# Patient Record
Sex: Female | Born: 1945 | Race: White | Hispanic: No | Smoking: Former smoker
Health system: Southern US, Community
[De-identification: ages and names within clinical notes are randomized; demographics above are authoritative.]

## PROBLEM LIST (undated history)

## (undated) DIAGNOSIS — N200 Calculus of kidney: Secondary | ICD-10-CM

## (undated) DIAGNOSIS — T8859XA Other complications of anesthesia, initial encounter: Secondary | ICD-10-CM

## (undated) DIAGNOSIS — K8020 Calculus of gallbladder without cholecystitis without obstruction: Secondary | ICD-10-CM

## (undated) DIAGNOSIS — T4145XA Adverse effect of unspecified anesthetic, initial encounter: Secondary | ICD-10-CM

## (undated) DIAGNOSIS — K5792 Diverticulitis of intestine, part unspecified, without perforation or abscess without bleeding: Secondary | ICD-10-CM

## (undated) DIAGNOSIS — L719 Rosacea, unspecified: Secondary | ICD-10-CM

## (undated) HISTORY — PX: PARTIAL HYSTERECTOMY: SHX80

## (undated) HISTORY — PX: COLON SURGERY: SHX602

## (undated) HISTORY — PX: KNEE SURGERY: SHX244

## (undated) HISTORY — DX: Rosacea, unspecified: L71.9

## (undated) HISTORY — PX: ABDOMINAL HYSTERECTOMY: SHX81

## (undated) HISTORY — DX: Diverticulitis of intestine, part unspecified, without perforation or abscess without bleeding: K57.92

## (undated) HISTORY — PX: HEMORRHOID SURGERY: SHX153

---

## 2001-02-28 ENCOUNTER — Emergency Department (HOSPITAL_COMMUNITY): Admission: EM | Admit: 2001-02-28 | Discharge: 2001-02-28 | Payer: Self-pay | Admitting: Emergency Medicine

## 2001-11-22 ENCOUNTER — Other Ambulatory Visit: Admission: RE | Admit: 2001-11-22 | Discharge: 2001-11-22 | Payer: Self-pay | Admitting: Obstetrics & Gynecology

## 2002-03-09 ENCOUNTER — Emergency Department (HOSPITAL_COMMUNITY): Admission: EM | Admit: 2002-03-09 | Discharge: 2002-03-09 | Payer: Self-pay

## 2002-03-09 ENCOUNTER — Encounter: Payer: Self-pay | Admitting: Emergency Medicine

## 2002-11-23 ENCOUNTER — Other Ambulatory Visit: Admission: RE | Admit: 2002-11-23 | Discharge: 2002-11-23 | Payer: Self-pay | Admitting: Obstetrics & Gynecology

## 2003-11-29 ENCOUNTER — Other Ambulatory Visit: Admission: RE | Admit: 2003-11-29 | Discharge: 2003-11-29 | Payer: Self-pay | Admitting: Obstetrics & Gynecology

## 2004-12-23 ENCOUNTER — Other Ambulatory Visit: Admission: RE | Admit: 2004-12-23 | Discharge: 2004-12-23 | Payer: Self-pay | Admitting: Obstetrics and Gynecology

## 2006-03-03 ENCOUNTER — Other Ambulatory Visit: Admission: RE | Admit: 2006-03-03 | Discharge: 2006-03-03 | Payer: Self-pay | Admitting: Obstetrics and Gynecology

## 2007-02-21 ENCOUNTER — Emergency Department (HOSPITAL_COMMUNITY): Admission: EM | Admit: 2007-02-21 | Discharge: 2007-02-21 | Payer: Self-pay | Admitting: Family Medicine

## 2010-01-20 ENCOUNTER — Emergency Department (HOSPITAL_COMMUNITY): Admission: EM | Admit: 2010-01-20 | Discharge: 2010-01-20 | Payer: Self-pay | Admitting: Family Medicine

## 2010-01-24 ENCOUNTER — Emergency Department (HOSPITAL_COMMUNITY): Admission: EM | Admit: 2010-01-24 | Discharge: 2010-01-24 | Payer: Self-pay | Admitting: Family Medicine

## 2010-01-24 ENCOUNTER — Emergency Department (HOSPITAL_COMMUNITY): Admission: EM | Admit: 2010-01-24 | Discharge: 2010-01-24 | Payer: Self-pay | Admitting: Emergency Medicine

## 2010-07-13 ENCOUNTER — Inpatient Hospital Stay (HOSPITAL_COMMUNITY)
Admission: EM | Admit: 2010-07-13 | Discharge: 2010-07-16 | Payer: Self-pay | Source: Home / Self Care | Admitting: Emergency Medicine

## 2010-07-13 HISTORY — PX: TOTAL SHOULDER REPLACEMENT: SUR1217

## 2011-01-23 LAB — CBC
HCT: 34.6 % — ABNORMAL LOW (ref 36.0–46.0)
HCT: 35.5 % — ABNORMAL LOW (ref 36.0–46.0)
HCT: 47.8 % — ABNORMAL HIGH (ref 36.0–46.0)
Hemoglobin: 11.5 g/dL — ABNORMAL LOW (ref 12.0–15.0)
Hemoglobin: 11.8 g/dL — ABNORMAL LOW (ref 12.0–15.0)
Hemoglobin: 16.8 g/dL — ABNORMAL HIGH (ref 12.0–15.0)
MCH: 30.6 pg (ref 26.0–34.0)
MCH: 31.7 pg (ref 26.0–34.0)
MCH: 32.4 pg (ref 26.0–34.0)
MCHC: 33.2 g/dL (ref 30.0–36.0)
MCHC: 33.2 g/dL (ref 30.0–36.0)
MCHC: 35.1 g/dL (ref 30.0–36.0)
MCV: 92 fL (ref 78.0–100.0)
MCV: 92.3 fL (ref 78.0–100.0)
MCV: 95.3 fL (ref 78.0–100.0)
Platelets: 144 10*3/uL — ABNORMAL LOW (ref 150–400)
Platelets: 178 10*3/uL (ref 150–400)
Platelets: 197 10*3/uL (ref 150–400)
RBC: 3.63 MIL/uL — ABNORMAL LOW (ref 3.87–5.11)
RBC: 3.86 MIL/uL — ABNORMAL LOW (ref 3.87–5.11)
RBC: 5.18 MIL/uL — ABNORMAL HIGH (ref 3.87–5.11)
RDW: 13.2 % (ref 11.5–15.5)
RDW: 13.3 % (ref 11.5–15.5)
RDW: 13.9 % (ref 11.5–15.5)
WBC: 7.6 10*3/uL (ref 4.0–10.5)
WBC: 8.4 10*3/uL (ref 4.0–10.5)
WBC: 8.6 10*3/uL (ref 4.0–10.5)

## 2011-01-23 LAB — DIFFERENTIAL
Basophils Absolute: 0 10*3/uL (ref 0.0–0.1)
Basophils Relative: 0 % (ref 0–1)
Eosinophils Absolute: 0.1 10*3/uL (ref 0.0–0.7)
Eosinophils Relative: 1 % (ref 0–5)
Lymphocytes Relative: 22 % (ref 12–46)
Lymphs Abs: 1.8 10*3/uL (ref 0.7–4.0)
Monocytes Absolute: 0.6 10*3/uL (ref 0.1–1.0)
Monocytes Relative: 7 % (ref 3–12)
Neutro Abs: 5.9 10*3/uL (ref 1.7–7.7)
Neutrophils Relative %: 71 % (ref 43–77)

## 2011-01-23 LAB — BASIC METABOLIC PANEL
BUN: 5 mg/dL — ABNORMAL LOW (ref 6–23)
BUN: 7 mg/dL (ref 6–23)
BUN: 9 mg/dL (ref 6–23)
CO2: 23 mEq/L (ref 19–32)
CO2: 24 mEq/L (ref 19–32)
CO2: 28 mEq/L (ref 19–32)
Calcium: 10.1 mg/dL (ref 8.4–10.5)
Calcium: 8.5 mg/dL (ref 8.4–10.5)
Calcium: 8.8 mg/dL (ref 8.4–10.5)
Chloride: 110 mEq/L (ref 96–112)
Chloride: 110 mEq/L (ref 96–112)
Chloride: 110 mEq/L (ref 96–112)
Creatinine, Ser: 0.45 mg/dL (ref 0.4–1.2)
Creatinine, Ser: 0.54 mg/dL (ref 0.4–1.2)
Creatinine, Ser: 0.69 mg/dL (ref 0.4–1.2)
GFR calc Af Amer: >60 mL/min (ref >60)
GFR calc Af Amer: >60 mL/min (ref >60)
GFR calc Af Amer: >60 mL/min (ref >60)
GFR calc non Af Amer: >60 mL/min (ref >60)
GFR calc non Af Amer: >60 mL/min (ref >60)
GFR calc non Af Amer: >60 mL/min (ref >60)
Glucose, Bld: 100 mg/dL — ABNORMAL HIGH (ref 70–99)
Glucose, Bld: 120 mg/dL — ABNORMAL HIGH (ref 70–99)
Glucose, Bld: 96 mg/dL (ref 70–99)
Potassium: 3.7 mEq/L (ref 3.5–5.1)
Potassium: 3.9 mEq/L (ref 3.5–5.1)
Potassium: 4 mEq/L (ref 3.5–5.1)
Sodium: 140 mEq/L (ref 135–145)
Sodium: 141 mEq/L (ref 135–145)
Sodium: 141 mEq/L (ref 135–145)

## 2011-01-23 LAB — POCT I-STAT, CHEM 8
BUN: 9 mg/dL (ref 6–23)
Calcium, Ion: 1.17 mmol/L (ref 1.12–1.32)
Chloride: 109 mEq/L (ref 96–112)
Creatinine, Ser: 0.8 mg/dL (ref 0.4–1.2)
Glucose, Bld: 100 mg/dL — ABNORMAL HIGH (ref 70–99)
HCT: 48 % — ABNORMAL HIGH (ref 36.0–46.0)
Hemoglobin: 16.3 g/dL — ABNORMAL HIGH (ref 12.0–15.0)
Potassium: 3.7 mEq/L (ref 3.5–5.1)
Sodium: 141 mEq/L (ref 135–145)
TCO2: 22 mmol/L (ref 0–100)

## 2011-01-23 LAB — TYPE AND SCREEN
ABO/RH(D): O POS
Antibody Screen: NEGATIVE

## 2011-01-23 LAB — URINALYSIS, ROUTINE W REFLEX MICROSCOPIC
Bilirubin Urine: NEGATIVE
Glucose, UA: NEGATIVE mg/dL
Hgb urine dipstick: NEGATIVE
Ketones, ur: >80 mg/dL — AB
Nitrite: NEGATIVE
Protein, ur: NEGATIVE mg/dL
Specific Gravity, Urine: 1.018 (ref 1.005–1.030)
Urobilinogen, UA: 0.2 mg/dL (ref 0.0–1.0)
pH: 6 (ref 5.0–8.0)

## 2011-01-23 LAB — ABO/RH: ABO/RH(D): O POS

## 2011-01-23 LAB — PROTIME-INR
INR: 1.12 (ref 0.00–1.49)
Prothrombin Time: 14.6 seconds (ref 11.6–15.2)

## 2011-01-23 LAB — APTT: aPTT: 24 seconds (ref 24–37)

## 2011-01-31 LAB — URINALYSIS, ROUTINE W REFLEX MICROSCOPIC
Bilirubin Urine: NEGATIVE
Glucose, UA: NEGATIVE mg/dL
Hgb urine dipstick: NEGATIVE
Ketones, ur: NEGATIVE mg/dL
Nitrite: NEGATIVE
Protein, ur: NEGATIVE mg/dL
Specific Gravity, Urine: 1.01 (ref 1.005–1.030)
Urobilinogen, UA: 0.2 mg/dL (ref 0.0–1.0)
pH: 5.5 (ref 5.0–8.0)

## 2011-01-31 LAB — POCT URINALYSIS DIP (DEVICE)
Bilirubin Urine: NEGATIVE
Bilirubin Urine: NEGATIVE
Glucose, UA: NEGATIVE mg/dL
Glucose, UA: NEGATIVE mg/dL
Hgb urine dipstick: NEGATIVE
Hgb urine dipstick: NEGATIVE
Ketones, ur: NEGATIVE mg/dL
Nitrite: NEGATIVE
Nitrite: NEGATIVE
Protein, ur: NEGATIVE mg/dL
Protein, ur: NEGATIVE mg/dL
Specific Gravity, Urine: 1.02 (ref 1.005–1.030)
Specific Gravity, Urine: <=1.005 (ref 1.005–1.030)
Urobilinogen, UA: 0.2 mg/dL (ref 0.0–1.0)
Urobilinogen, UA: 0.2 mg/dL (ref 0.0–1.0)
pH: 6 (ref 5.0–8.0)
pH: 7.5 (ref 5.0–8.0)

## 2011-01-31 LAB — URINE MICROSCOPIC-ADD ON

## 2011-01-31 LAB — URINE CULTURE
Colony Count: NO GROWTH
Culture: NO GROWTH

## 2011-10-28 ENCOUNTER — Encounter: Payer: Self-pay | Admitting: Emergency Medicine

## 2011-10-28 ENCOUNTER — Emergency Department (HOSPITAL_COMMUNITY): Payer: BC Managed Care – PPO

## 2011-10-28 ENCOUNTER — Emergency Department (HOSPITAL_COMMUNITY)
Admission: EM | Admit: 2011-10-28 | Discharge: 2011-10-28 | Disposition: A | Discharge status: Home / Self Care | Payer: BC Managed Care – PPO | Attending: Emergency Medicine | Admitting: Emergency Medicine

## 2011-10-28 DIAGNOSIS — K5730 Diverticulosis of large intestine without perforation or abscess without bleeding: Secondary | ICD-10-CM | POA: Insufficient documentation

## 2011-10-28 DIAGNOSIS — R319 Hematuria, unspecified: Secondary | ICD-10-CM | POA: Insufficient documentation

## 2011-10-28 DIAGNOSIS — R10814 Left lower quadrant abdominal tenderness: Secondary | ICD-10-CM | POA: Insufficient documentation

## 2011-10-28 DIAGNOSIS — R109 Unspecified abdominal pain: Secondary | ICD-10-CM | POA: Insufficient documentation

## 2011-10-28 DIAGNOSIS — Z87442 Personal history of urinary calculi: Secondary | ICD-10-CM | POA: Insufficient documentation

## 2011-10-28 HISTORY — DX: Calculus of kidney: N20.0

## 2011-10-28 LAB — URINALYSIS, ROUTINE W REFLEX MICROSCOPIC
Bilirubin Urine: NEGATIVE
Glucose, UA: NEGATIVE mg/dL
Ketones, ur: NEGATIVE mg/dL
Leukocytes, UA: NEGATIVE
Nitrite: NEGATIVE
Protein, ur: NEGATIVE mg/dL
Specific Gravity, Urine: 1.006 (ref 1.005–1.030)
Urobilinogen, UA: 0.2 mg/dL (ref 0.0–1.0)
pH: 7 (ref 5.0–8.0)

## 2011-10-28 LAB — URINE MICROSCOPIC-ADD ON

## 2011-10-28 MED ORDER — ONDANSETRON HCL 4 MG/2ML IJ SOLN
4.0000 mg | Freq: Once | INTRAMUSCULAR | Status: AC
  Administered 2011-10-28: 4 mg via INTRAVENOUS
  Filled 2011-10-28: qty 2

## 2011-10-28 MED ORDER — SODIUM CHLORIDE 0.9 % IV SOLN
INTRAVENOUS | Status: DC
  Administered 2011-10-28: 05:00:00 via INTRAVENOUS

## 2011-10-28 MED ORDER — HYDROCODONE-ACETAMINOPHEN 5-325 MG PO TABS: 2.0000 | ORAL_TABLET | ORAL | Status: AC | PRN

## 2011-10-28 MED ORDER — MORPHINE SULFATE 2 MG/ML IJ SOLN
INTRAMUSCULAR | Status: AC
  Administered 2011-10-28: 4 mg via INTRAMUSCULAR
  Filled 2011-10-28: qty 2

## 2011-10-28 MED ORDER — MORPHINE SULFATE 4 MG/ML IJ SOLN: 4.0000 mg | Freq: Once | INTRAMUSCULAR | Status: DC

## 2011-10-28 NOTE — ED Provider Notes (Signed)
History     CSN: [id number] Arrival date & time: 10/28/2011  2:13 AM   First MD Initiated Contact with Patient 10/28/11 0503      Chief Complaint  Patient presents with  . Flank Pain    (Consider location/radiation/quality/duration/timing/severity/associated sxs/prior treatment) Patient is a 65 y.o. female presenting with flank pain. The history is provided by the patient.  Flank Pain This is a new problem. The current episode started 3 to 5 hours ago. The problem occurs constantly. The problem has not changed since onset.Pertinent negatives include no chest pain, no abdominal pain and no headaches. The symptoms are aggravated by nothing. The symptoms are relieved by nothing. She has tried nothing for the symptoms.   since avoiding in emergency department. Her pain has improved, but is still 6/10. She last passed a kidney stone on her own 6 months ago. She has not had repeated abdominal imaging for evaluation of kidney stones.  Past Medical History  Diagnosis Date  . Kidney stones     Past Surgical History  Procedure Date  . Total shoulder replacement     Family History  Problem Relation Age of Onset  . Nephrolithiasis Other     History  Substance Use Topics  . Smoking status: Never Smoker   . Smokeless tobacco: Not on file  . Alcohol Use: No    OB History    Grav Para Term Preterm Abortions TAB SAB Ect Mult Living                  Review of Systems  Cardiovascular: Negative for chest pain.  Gastrointestinal: Negative for abdominal pain.  Genitourinary: Positive for flank pain.  Neurological: Negative for headaches.  All other systems reviewed and are negative.    Allergies  Naproxen; Macrobid; and Penicillins  Home Medications   Current Outpatient Rx  Name Route Sig Dispense Refill  . HYDROCODONE-ACETAMINOPHEN 5-325 MG PO TABS Oral Take 2 tablets by mouth every 4 (four) hours as needed for pain. 20 tablet 0    BP 141/88  Pulse 81  Temp(Src) 98.1  F (36.7 C) (Oral)  Resp 20  SpO2 96%  Physical Exam  Constitutional: She is oriented to person, place, and time. She appears well-developed and well-nourished.  HENT:  Head: Normocephalic and atraumatic.  Eyes: Conjunctivae are normal. Pupils are equal, round, and reactive to light.  Neck: Neck supple.  Cardiovascular: Normal rate.   Pulmonary/Chest: Effort normal and breath sounds normal.  Abdominal: Soft. Bowel sounds are normal. She exhibits no distension. There is tenderness (Left lower quadrant, mild). There is no rebound.  Musculoskeletal: Normal range of motion.       No costovertebral angle tenderness  Neurological: She is alert and oriented to person, place, and time.  Skin: Skin is warm and dry.  Psychiatric: She has a normal mood and affect. Her behavior is normal. Judgment and thought content normal.    ED Course  Procedures (including critical care time) ED treatment, morphine and Zofran. At this point, the patient is comfortable. Labs Reviewed  URINALYSIS, ROUTINE W REFLEX MICROSCOPIC - Abnormal; Notable for the following:    Hgb urine dipstick LARGE (*)    All other components within normal limits  URINE MICROSCOPIC-ADD ON  URINE CULTURE   Ct Abdomen Pelvis Wo Contrast  10/28/2011  *RADIOLOGY REPORT*  Clinical Data: Left flank pain, hematuria  CT ABDOMEN AND PELVIS WITHOUT CONTRAST  Technique:  Multidetector CT imaging of the abdomen and pelvis was performed following  the standard protocol without intravenous contrast.  Comparison: None.  Findings: Peripheral right lower lobe scarring and/or atelectasis. Heart size within normal limits.  No pleural or pericardial effusion. Small hiatal hernia.  Intra-abdominal organ evaluation is limited without intravenous contrast.  Within this limitation, low attenuation of the liver is in keeping with fatty infiltration.  Unremarkable biliary system, spleen, pancreas, adrenal glands.  Nonobstructing bilateral upper pole renal  stones.  No hydronephrosis or hydroureter.  No left-sided ureteral calculi. There is a 4 mm calcific density adjacent to or within the right distal ureter.  Colonic diverticulosis without CT evidence for diverticulitis. Appendix is normal.  No free intraperitoneal air or fluid.  No lymphadenopathy.  There is scattered atherosclerotic calcification of the aorta and its branches. No aneurysmal dilatation.  Thin-walled bladder.  No acute osseous abnormality.  IMPRESSION: Bilateral nonobstructing renal stones.  No hydronephrosis or hydroureter.  No left-sided ureteral calculi.  There is a 4 mm calcification within or immediately adjacent to the decompressed right distal ureter (Therefore, may represent a ureteral stone without proximal dilatation).  Colonic diverticulosis without CT evidence of diverticulitis.  Original Report Authenticated By: Waneta Martins, M.D.     1. Abdominal pain   2. Hematuria       MDM  Patient had left lower abdominal pain that has improved in the emergency department. CT did not reveal a distal left ureter stone, nor left-sided hydronephrosis. Additionally, note other acute intra-abdominal problems were seen on the CAT scan. Urinalysis is remarkable only for elevated red blood cells. It is possible that she has passed. A kidney stone on the left. The CT finding of a possible distal right ureter stone is not consistent with her complaints of physical examination. Patient will be discharged with prescription for narcotics for symptomatic treatment and advised to followup with her PCP for the hematuria.        Flint Melter, MD 10/28/11 [phone]

## 2011-10-28 NOTE — ED Notes (Signed)
Pt presents with c/o left flank pain that started tonight about 0045  Pt states pain is sharp in nature and continuous  Pt states she has had nausea and vomiting tonight  Pt has hx of kidney stones

## 2011-10-29 LAB — URINE CULTURE
Colony Count: 70000
Culture  Setup Time: 201212180959

## 2011-12-01 ENCOUNTER — Emergency Department (INDEPENDENT_AMBULATORY_CARE_PROVIDER_SITE_OTHER)
Admission: EM | Admit: 2011-12-01 | Discharge: 2011-12-01 | Disposition: A | Discharge status: Home / Self Care | Payer: BC Managed Care – PPO | Source: Home / Self Care | Attending: Emergency Medicine | Admitting: Emergency Medicine

## 2011-12-01 ENCOUNTER — Encounter (HOSPITAL_COMMUNITY): Payer: Self-pay

## 2011-12-01 DIAGNOSIS — R300 Dysuria: Secondary | ICD-10-CM

## 2011-12-01 HISTORY — DX: Calculus of kidney: N20.0

## 2011-12-01 LAB — POCT URINALYSIS DIP (DEVICE)
Glucose, UA: NEGATIVE mg/dL
Ketones, ur: NEGATIVE mg/dL
Nitrite: NEGATIVE
Protein, ur: NEGATIVE mg/dL
Specific Gravity, Urine: >=1.03 (ref 1.005–1.030)
Urobilinogen, UA: 0.2 mg/dL (ref 0.0–1.0)
pH: 6 (ref 5.0–8.0)

## 2011-12-01 LAB — URINE CULTURE: Culture  Setup Time: 201301211440

## 2011-12-01 MED ORDER — CIPROFLOXACIN HCL 500 MG PO TABS: 500.0000 mg | ORAL_TABLET | Freq: Two times a day (BID) | ORAL | Status: AC

## 2011-12-01 NOTE — ED Notes (Signed)
Seen in ED and treated for kidney stones, instructed to follow-up after medication completed.  States took last pill this am.  Reports having passed one stone, still having lower abdominal discomfort, denies other sx.

## 2011-12-01 NOTE — ED Provider Notes (Signed)
History     CSN: [id number]  Arrival date & time 12/01/11  2130   First MD Initiated Contact with Patient 12/01/11 [phone]      Chief Complaint  Patient presents with  . Follow-up  . Nephrolithiasis    (Consider location/radiation/quality/duration/timing/severity/associated sxs/prior treatment) HPI Comments: Still with some pressure and discomfort, no burning but feels like i still have to urinate afer Im done, did passed a stone days earlier" I have had many stones in the past" No i did not follow-up with my doctor as it got better",  No fevers, No flank pain, No vomiting  The history is provided by the patient.    Past Medical History  Diagnosis Date  . Kidney stones   . Kidney stone     Past Surgical History  Procedure Date  . Total shoulder replacement   . Cesarean section   . Abdominal hysterectomy   . Knee surgery     Family History  Problem Relation Age of Onset  . Nephrolithiasis Other     History  Substance Use Topics  . Smoking status: Never Smoker   . Smokeless tobacco: Not on file  . Alcohol Use: No    OB History    Grav Para Term Preterm Abortions TAB SAB Ect Mult Living                  Review of Systems  Constitutional: Negative for fever, chills and activity change.  Gastrointestinal: Negative for nausea, vomiting and abdominal pain.  Genitourinary: Positive for dysuria and hematuria. Negative for flank pain, decreased urine volume, vaginal discharge, genital sores and pelvic pain.    Allergies  Naproxen; Macrobid; and Penicillins  Home Medications   Current Outpatient Rx  Name Route Sig Dispense Refill  . CIPROFLOXACIN HCL 500 MG PO TABS Oral Take 1 tablet (500 mg total) by mouth 2 (two) times daily. 10 tablet 0    BP 129/84  Pulse 87  Temp(Src) 98.3 F (36.8 C) (Oral)  Resp 16  SpO2 95%  Physical Exam  Nursing note and vitals reviewed. Constitutional: She appears well-developed and well-nourished.  Abdominal: Soft. She  exhibits no shifting dullness and no mass. There is tenderness in the suprapubic area. There is no rebound and no guarding.    ED Course  Procedures (including critical care time)  Labs Reviewed  POCT URINALYSIS DIP (DEVICE) - Abnormal; Notable for the following:    Bilirubin Urine SMALL (*)    Hgb urine dipstick TRACE (*)    Leukocytes, UA SMALL (*) Biochemical Testing Only. Please order routine urinalysis from main lab if confirmatory testing is needed.   All other components within normal limits  POCT URINALYSIS DIPSTICK  URINE CULTURE   No results found.   1. Dysuria       MDM  Patient came in requesting UA test, to check for hematuria, mild suprapubic discomfort with deep palpation, Reports passed stone since last ED visit. Describes some suprapubic discomfort and hesitancy. No flank pain, Afebrile.Recommended to see urologist, patient agreed. Have sent todays urine for cultures, previous culture probably contaminated        Jimmie Molly, MD 12/01/11 1906

## 2011-12-03 ENCOUNTER — Telehealth (HOSPITAL_COMMUNITY): Payer: Self-pay | Admitting: *Deleted

## 2011-12-03 NOTE — ED Notes (Signed)
Urine culture: 20,000 colonies Staph. species (coagulase negative).  Pt. treated with Cipro.  Lab shown to Dr. Tressia Danas and she said to call for clinical improvement. I called and left message. Vassie Moselle 12/03/2011

## 2012-01-16 ENCOUNTER — Emergency Department (HOSPITAL_COMMUNITY)
Admission: EM | Admit: 2012-01-16 | Discharge: 2012-01-17 | Disposition: A | Discharge status: Home / Self Care | Payer: BC Managed Care – PPO | Attending: Emergency Medicine | Admitting: Emergency Medicine

## 2012-01-16 ENCOUNTER — Emergency Department (HOSPITAL_COMMUNITY): Payer: BC Managed Care – PPO

## 2012-01-16 ENCOUNTER — Encounter (HOSPITAL_COMMUNITY): Payer: Self-pay | Admitting: Emergency Medicine

## 2012-01-16 DIAGNOSIS — W010XXA Fall on same level from slipping, tripping and stumbling without subsequent striking against object, initial encounter: Secondary | ICD-10-CM | POA: Insufficient documentation

## 2012-01-16 DIAGNOSIS — S92309A Fracture of unspecified metatarsal bone(s), unspecified foot, initial encounter for closed fracture: Secondary | ICD-10-CM | POA: Insufficient documentation

## 2012-01-16 DIAGNOSIS — Y9229 Other specified public building as the place of occurrence of the external cause: Secondary | ICD-10-CM | POA: Insufficient documentation

## 2012-01-16 DIAGNOSIS — M79609 Pain in unspecified limb: Secondary | ICD-10-CM | POA: Insufficient documentation

## 2012-01-16 MED ORDER — OXYCODONE-ACETAMINOPHEN 5-325 MG PO TABS
1.0000 | ORAL_TABLET | Freq: Once | ORAL | Status: AC
  Administered 2012-01-16: 1 via ORAL
  Filled 2012-01-16: qty 1

## 2012-01-16 NOTE — ED Notes (Signed)
Pt states she slipped on the sidewalk and fell  Pt is c/o pain to her left foot on the outside  Pt states she has been walking on it since  Pt able to wiggle her toes

## 2012-01-17 MED ORDER — OXYCODONE-ACETAMINOPHEN 5-325 MG PO TABS: 2.0000 | ORAL_TABLET | ORAL | Status: AC | PRN

## 2012-01-17 NOTE — ED Provider Notes (Signed)
History     CSN: [id number]  Arrival date & time 01/16/12  2240   First MD Initiated Contact with Patient 01/17/12 0015      Chief Complaint  Patient presents with  . Fall  . Foot Injury  . Foot Pain    (Consider location/radiation/quality/duration/timing/severity/associated sxs/prior treatment) Patient is a 66 y.o. female presenting with fall, foot injury, and lower extremity pain. The history is provided by the patient. No language interpreter was used.  Fall The accident occurred 1 to 2 hours ago. The fall occurred while walking. She landed on concrete. There was no blood loss. Point of impact: L foot. Pain location: L foot. The pain is at a severity of 10/10. The pain is moderate. She was not ambulatory at the scene. There was no entrapment after the fall. There was no drug use involved in the accident. There was no alcohol use involved in the accident. Pertinent negatives include no visual change, no fever, no numbness, no abdominal pain, no bowel incontinence, no nausea, no vomiting, no hematuria, no headaches, no hearing loss and no loss of consciousness. The symptoms are aggravated by activity and standing.  Foot Injury  Pertinent negatives include no numbness.  Foot Pain Pertinent negatives include no abdominal pain, fever, headaches, nausea, numbness, visual change or vomiting.   Reports [place] [employer] and  she slipped and tripped on her left foot. Pain is in the left lateral foot. Pain is worse when she is bearing weight. 2/10 when she is not bearing weight. Good sensation and movement to the toes.     Past Medical History  Diagnosis Date  . Kidney stones   . Kidney stone     Past Surgical History  Procedure Date  . Total shoulder replacement   . Cesarean section   . Abdominal hysterectomy   . Knee surgery     Family History  Problem Relation Age of Onset  . Nephrolithiasis Other     History  Substance Use Topics  . Smoking  status: Never Smoker   . Smokeless tobacco: Not on file  . Alcohol Use: No    OB History    Grav Para Term Preterm Abortions TAB SAB Ect Mult Living                  Review of Systems  Constitutional: Negative for fever.  Gastrointestinal: Negative for nausea, vomiting, abdominal pain and bowel incontinence.  Genitourinary: Negative for hematuria.  Neurological: Negative for loss of consciousness, numbness and headaches.    Allergies  Naproxen; Macrobid; and Penicillins  Home Medications  No current outpatient prescriptions on file.  BP 140/73  Pulse 84  Temp(Src) 98.2 F (36.8 C) (Oral)  Resp 20  SpO2 94%  Physical Exam  Nursing note and vitals reviewed. Constitutional: She is oriented to person, place, and time. She appears well-developed and well-nourished.  HENT:  Head: Normocephalic and atraumatic.  Eyes: Conjunctivae and EOM are normal. Pupils are equal, round, and reactive to light.  Neck: Normal range of motion. Neck supple.  Cardiovascular: Normal rate, regular rhythm, normal heart sounds and intact distal pulses.   Pulmonary/Chest: Effort normal and breath sounds normal.  Abdominal: Soft.  Musculoskeletal: Normal range of motion. She exhibits tenderness. She exhibits no edema.       L lateral foot tenderness  Neurological: She is alert and oriented to person, place, and time. She has normal reflexes.  Skin: Skin is warm and dry.  Psychiatric: She has a normal mood and affect.    ED Course  Procedures (including critical care time)  Labs Reviewed - No data to display Dg Foot Complete Left  01/16/2012  *RADIOLOGY REPORT*  Clinical Data: Left lateral foot pain  LEFT FOOT - COMPLETE 3+ VIEW  Comparison: None.  Findings: Transverse fracture through the base of the fifth metatarsal.  Osteopenia.  No additional fracture identified.  No dislocation.  IMPRESSION: Transverse fracture through the base of the fifth metatarsal.  Original Report Authenticated By:  Waneta Martins, M.D.     No diagnosis found.    MDM  65yo female with transverse fx through the base of the 5th metatarsal.  Larey Seat going to the [facility] [employer] womens BB tournament.  Cam walker, crutches and pain meds provided.  Will follow up next week with ortho.  pmh of  Kidney stones.        Jethro Bastos, NP 01/17/12 1146

## 2012-01-17 NOTE — ED Provider Notes (Signed)
Medical screening examination/treatment/procedure(s) were performed by non-physician practitioner and as supervising physician I was immediately available for consultation/collaboration.   Hanley Seamen, MD 01/17/12 2234

## 2012-01-17 NOTE — Discharge Instructions (Signed)
This [place] he have a fractured bone in your left foot today. Wear the Cam Walker and use crutches as needed. Followup with an orthopedic of your choice or he can use Dr. doll for her foot [facility]. Should be seen one day next week. Percocet for pain but did not drop with this. Keep the extremity elevated and ice x24 hours intermittently.   Foot Fracture Your caregiver has diagnosed you as having a foot fracture (broken bone). Your foot has many bones. You have a fracture, or break, in one of these bones. In some cases, your doctor may put on a splint or removable fracture boot until the swelling in your foot has lessened. A cast may or may not be required. HOME CARE INSTRUCTIONS  If you do not have a cast or splint:  You may bear weight on your injured foot as tolerated or advised.   Do not put any weight on your injured foot for as long as directed by your caregiver. Slowly increase the amount of time you walk on the foot as the pain and swelling allows or as advised.   Use crutches until you can bear weight without pain. A gradual increase in weight bearing may help.   Apply ice to the injury for 15 to 20 minutes each hour while awake for the first 2 days. Put the ice in a plastic bag and place a towel between the bag of ice and your skin.   If an ace bandage (stretchy, elastic wrapping bandage) was applied, you may re-wrap it if ankle is more painful or your toes become cold and swollen.  If you have a cast or splint:  Use your crutches for as long as directed by your caregiver.   To lessen the swelling, keep the injured foot elevated on pillows while lying down or sitting. Elevate your foot above your heart.   Apply ice to the injury for 15 to 20 minutes each hour while awake for the first 2 days. Put the ice in a plastic bag and place a thin towel between the bag of ice and your cast.   Plaster or fiberglass cast:   Do not try to scratch the skin under the cast using a  sharp or pointed object down the cast.   Check the skin around the cast every day. You may put lotion on any red or sore areas.   Keep your cast clean and dry.   Plaster splint:   Wear the splint until you are seen for a follow-up examination.   You may loosen the elastic around the splint if your toes become numb, tingle, or turn blue or cold. Do not rest it on anything harder than a pillow in the first 24 hours.   Do not put pressure on any part of your splint. Use your crutches as directed.   Keep your splint dry. It can be protected during bathing with a plastic bag. Do not lower the splint into water.   If you have a fracture boot you may remove it to shower. Bear weight only as instructed by your caregiver.   Only take over-the-counter or prescription medicines for pain, discomfort, or fever as directed by your caregiver.  SEEK IMMEDIATE MEDICAL CARE IF:   Your cast gets damaged or breaks.   You have continued severe pain or more swelling than you did before the cast was put on.   Your skin or nails of your casted foot turn blue, gray, feel cold  or numb.   There is a bad smell from your cast.   There is severe pain with movement of your toes.   There are new stains and/or drainage coming from under the cast.  MAKE SURE YOU:   Understand these instructions.   Will watch your condition.   Will get help right away if you are not doing well or get worse.  Document Released: 10/24/2000 Document Revised: 10/16/2011 Document Reviewed: 11/30/2008 [facility] [employer] Patient Information 2012 [place], [place].

## 2014-12-11 DIAGNOSIS — L718 Other rosacea: Secondary | ICD-10-CM | POA: Diagnosis not present

## 2014-12-11 DIAGNOSIS — D485 Neoplasm of uncertain behavior of skin: Secondary | ICD-10-CM | POA: Diagnosis not present

## 2014-12-11 DIAGNOSIS — L820 Inflamed seborrheic keratosis: Secondary | ICD-10-CM | POA: Diagnosis not present

## 2015-01-08 DIAGNOSIS — Z1231 Encounter for screening mammogram for malignant neoplasm of breast: Secondary | ICD-10-CM | POA: Diagnosis not present

## 2015-04-02 DIAGNOSIS — S30861A Insect bite (nonvenomous) of abdominal wall, initial encounter: Secondary | ICD-10-CM | POA: Diagnosis not present

## 2015-04-27 DIAGNOSIS — H25813 Combined forms of age-related cataract, bilateral: Secondary | ICD-10-CM | POA: Diagnosis not present

## 2015-05-18 DIAGNOSIS — Z01419 Encounter for gynecological examination (general) (routine) without abnormal findings: Secondary | ICD-10-CM | POA: Diagnosis not present

## 2015-05-18 DIAGNOSIS — Z1212 Encounter for screening for malignant neoplasm of rectum: Secondary | ICD-10-CM | POA: Diagnosis not present

## 2015-05-18 DIAGNOSIS — Z6830 Body mass index (BMI) 30.0-30.9, adult: Secondary | ICD-10-CM | POA: Diagnosis not present

## 2015-05-18 DIAGNOSIS — Z1272 Encounter for screening for malignant neoplasm of vagina: Secondary | ICD-10-CM | POA: Diagnosis not present

## 2015-05-18 DIAGNOSIS — Z90710 Acquired absence of both cervix and uterus: Secondary | ICD-10-CM | POA: Diagnosis not present

## 2015-09-07 DIAGNOSIS — L814 Other melanin hyperpigmentation: Secondary | ICD-10-CM | POA: Diagnosis not present

## 2015-09-07 DIAGNOSIS — L821 Other seborrheic keratosis: Secondary | ICD-10-CM | POA: Diagnosis not present

## 2015-09-07 DIAGNOSIS — L570 Actinic keratosis: Secondary | ICD-10-CM | POA: Diagnosis not present

## 2015-09-07 DIAGNOSIS — L820 Inflamed seborrheic keratosis: Secondary | ICD-10-CM | POA: Diagnosis not present

## 2015-09-07 DIAGNOSIS — D225 Melanocytic nevi of trunk: Secondary | ICD-10-CM | POA: Diagnosis not present

## 2015-12-07 DIAGNOSIS — L821 Other seborrheic keratosis: Secondary | ICD-10-CM | POA: Diagnosis not present

## 2015-12-07 DIAGNOSIS — L570 Actinic keratosis: Secondary | ICD-10-CM | POA: Diagnosis not present

## 2015-12-07 DIAGNOSIS — L820 Inflamed seborrheic keratosis: Secondary | ICD-10-CM | POA: Diagnosis not present

## 2015-12-07 DIAGNOSIS — D225 Melanocytic nevi of trunk: Secondary | ICD-10-CM | POA: Diagnosis not present

## 2015-12-11 ENCOUNTER — Encounter (HOSPITAL_COMMUNITY): Payer: Self-pay | Admitting: *Deleted

## 2015-12-11 ENCOUNTER — Emergency Department (HOSPITAL_COMMUNITY): Payer: Medicare Other

## 2015-12-11 ENCOUNTER — Observation Stay (HOSPITAL_COMMUNITY)
Admission: EM | Admit: 2015-12-11 | Discharge: 2015-12-15 | Disposition: A | Discharge status: Home / Self Care | Payer: Medicare Other | Attending: Internal Medicine | Admitting: Internal Medicine

## 2015-12-11 DIAGNOSIS — R1030 Lower abdominal pain, unspecified: Secondary | ICD-10-CM | POA: Diagnosis not present

## 2015-12-11 DIAGNOSIS — K5792 Diverticulitis of intestine, part unspecified, without perforation or abscess without bleeding: Secondary | ICD-10-CM | POA: Diagnosis present

## 2015-12-11 DIAGNOSIS — N289 Disorder of kidney and ureter, unspecified: Secondary | ICD-10-CM | POA: Diagnosis not present

## 2015-12-11 DIAGNOSIS — D72829 Elevated white blood cell count, unspecified: Secondary | ICD-10-CM | POA: Diagnosis not present

## 2015-12-11 DIAGNOSIS — I2510 Atherosclerotic heart disease of native coronary artery without angina pectoris: Secondary | ICD-10-CM | POA: Diagnosis not present

## 2015-12-11 DIAGNOSIS — N200 Calculus of kidney: Secondary | ICD-10-CM | POA: Insufficient documentation

## 2015-12-11 DIAGNOSIS — N390 Urinary tract infection, site not specified: Secondary | ICD-10-CM | POA: Diagnosis not present

## 2015-12-11 DIAGNOSIS — I700 Atherosclerosis of aorta: Secondary | ICD-10-CM | POA: Diagnosis not present

## 2015-12-11 DIAGNOSIS — R109 Unspecified abdominal pain: Secondary | ICD-10-CM | POA: Diagnosis not present

## 2015-12-11 DIAGNOSIS — Z96619 Presence of unspecified artificial shoulder joint: Secondary | ICD-10-CM | POA: Diagnosis not present

## 2015-12-11 DIAGNOSIS — R000 Tachycardia, unspecified: Secondary | ICD-10-CM | POA: Diagnosis not present

## 2015-12-11 DIAGNOSIS — K5732 Diverticulitis of large intestine without perforation or abscess without bleeding: Secondary | ICD-10-CM | POA: Diagnosis not present

## 2015-12-11 DIAGNOSIS — R1032 Left lower quadrant pain: Secondary | ICD-10-CM | POA: Diagnosis present

## 2015-12-11 DIAGNOSIS — K5733 Diverticulitis of large intestine without perforation or abscess with bleeding: Secondary | ICD-10-CM | POA: Diagnosis not present

## 2015-12-11 LAB — URINALYSIS, ROUTINE W REFLEX MICROSCOPIC
Bilirubin Urine: NEGATIVE
Glucose, UA: NEGATIVE mg/dL
Hgb urine dipstick: NEGATIVE
Ketones, ur: 15 mg/dL — AB
Nitrite: NEGATIVE
Protein, ur: NEGATIVE mg/dL
Specific Gravity, Urine: 1.008 (ref 1.005–1.030)
pH: 7.5 (ref 5.0–8.0)

## 2015-12-11 LAB — CBC
HCT: 45.9 % (ref 36.0–46.0)
Hemoglobin: 16.2 g/dL — ABNORMAL HIGH (ref 12.0–15.0)
MCH: 32.9 pg (ref 26.0–34.0)
MCHC: 35.3 g/dL (ref 30.0–36.0)
MCV: 93.3 fL (ref 78.0–100.0)
Platelets: 213 10*3/uL (ref 150–400)
RBC: 4.92 MIL/uL (ref 3.87–5.11)
RDW: 12.9 % (ref 11.5–15.5)
WBC: 16.3 10*3/uL — ABNORMAL HIGH (ref 4.0–10.5)

## 2015-12-11 LAB — URINE MICROSCOPIC-ADD ON

## 2015-12-11 LAB — COMPREHENSIVE METABOLIC PANEL
ALT: 55 U/L — ABNORMAL HIGH (ref 14–54)
AST: 32 U/L (ref 15–41)
Albumin: 4.4 g/dL (ref 3.5–5.0)
Alkaline Phosphatase: 82 U/L (ref 38–126)
Anion gap: 12 (ref 5–15)
BUN: 13 mg/dL (ref 6–20)
CO2: 24 mmol/L (ref 22–32)
Calcium: 10.3 mg/dL (ref 8.9–10.3)
Chloride: 106 mmol/L (ref 101–111)
Creatinine, Ser: 0.74 mg/dL (ref 0.44–1.00)
GFR calc Af Amer: >60 mL/min (ref >60)
GFR calc non Af Amer: >60 mL/min (ref >60)
Glucose, Bld: 114 mg/dL — ABNORMAL HIGH (ref 65–99)
Potassium: 3.9 mmol/L (ref 3.5–5.1)
Sodium: 142 mmol/L (ref 135–145)
Total Bilirubin: 1.9 mg/dL — ABNORMAL HIGH (ref 0.3–1.2)
Total Protein: 7.6 g/dL (ref 6.5–8.1)

## 2015-12-11 LAB — I-STAT CG4 LACTIC ACID, ED
Lactic Acid, Venous: 1.58 mmol/L (ref 0.5–2.0)
Lactic Acid, Venous: 1.63 mmol/L (ref 0.5–2.0)

## 2015-12-11 LAB — POC OCCULT BLOOD, ED: Fecal Occult Bld: POSITIVE — AB

## 2015-12-11 LAB — LIPASE, BLOOD: Lipase: 22 U/L (ref 11–51)

## 2015-12-11 LAB — TROPONIN I: Troponin I: <0.03 ng/mL (ref <0.031)

## 2015-12-11 MED ORDER — SODIUM CHLORIDE 0.9 % IV SOLN: INTRAVENOUS | Status: DC

## 2015-12-11 MED ORDER — SODIUM CHLORIDE 0.9 % IV BOLUS (SEPSIS)
1000.0000 mL | Freq: Once | INTRAVENOUS | Status: AC
  Administered 2015-12-11: 1000 mL via INTRAVENOUS

## 2015-12-11 MED ORDER — MORPHINE SULFATE (PF) 4 MG/ML IV SOLN
4.0000 mg | Freq: Once | INTRAVENOUS | Status: AC
  Administered 2015-12-11: 4 mg via INTRAVENOUS
  Filled 2015-12-11: qty 1

## 2015-12-11 MED ORDER — SODIUM CHLORIDE 0.9 % IV SOLN
INTRAVENOUS | Status: AC
  Administered 2015-12-11: 21:00:00 via INTRAVENOUS

## 2015-12-11 MED ORDER — METRONIDAZOLE IN NACL 5-0.79 MG/ML-% IV SOLN
500.0000 mg | Freq: Three times a day (TID) | INTRAVENOUS | Status: DC
  Administered 2015-12-12 – 2015-12-15 (×10): 500 mg via INTRAVENOUS
  Filled 2015-12-11 (×12): qty 100

## 2015-12-11 MED ORDER — METRONIDAZOLE IN NACL 5-0.79 MG/ML-% IV SOLN
500.0000 mg | Freq: Once | INTRAVENOUS | Status: AC
  Administered 2015-12-11: 500 mg via INTRAVENOUS
  Filled 2015-12-11: qty 100

## 2015-12-11 MED ORDER — CEFTRIAXONE SODIUM 1 G IJ SOLR
1.0000 g | INTRAMUSCULAR | Status: DC
  Administered 2015-12-12 – 2015-12-14 (×3): 1 g via INTRAVENOUS
  Filled 2015-12-11 (×4): qty 10

## 2015-12-11 MED ORDER — IOHEXOL 300 MG/ML  SOLN
25.0000 mL | Freq: Once | INTRAMUSCULAR | Status: AC | PRN
  Administered 2015-12-11: 25 mL via ORAL

## 2015-12-11 MED ORDER — ONDANSETRON HCL 4 MG/2ML IJ SOLN
4.0000 mg | Freq: Once | INTRAMUSCULAR | Status: AC
Start: 2015-12-11 — End: 2015-12-11
  Administered 2015-12-11: 4 mg via INTRAVENOUS
  Filled 2015-12-11: qty 2

## 2015-12-11 MED ORDER — IOHEXOL 300 MG/ML  SOLN
100.0000 mL | Freq: Once | INTRAMUSCULAR | Status: AC | PRN
  Administered 2015-12-11: 100 mL via INTRAVENOUS

## 2015-12-11 MED ORDER — ACETAMINOPHEN 325 MG PO TABS
650.0000 mg | ORAL_TABLET | Freq: Four times a day (QID) | ORAL | Status: DC | PRN
  Administered 2015-12-13: 650 mg via ORAL
  Filled 2015-12-11: qty 2

## 2015-12-11 MED ORDER — MORPHINE SULFATE (PF) 4 MG/ML IV SOLN
4.0000 mg | INTRAVENOUS | Status: DC | PRN
  Administered 2015-12-12 – 2015-12-13 (×5): 4 mg via INTRAVENOUS
  Filled 2015-12-11 (×5): qty 1

## 2015-12-11 MED ORDER — DEXTROSE 5 % IV SOLN
1.0000 g | Freq: Once | INTRAVENOUS | Status: AC
  Administered 2015-12-11: 1 g via INTRAVENOUS
  Filled 2015-12-11: qty 10

## 2015-12-11 NOTE — ED Notes (Signed)
MD at bedside. 

## 2015-12-11 NOTE — ED Notes (Signed)
Delay in lab draw,  Pt in bathroom. 

## 2015-12-11 NOTE — H&P (Signed)
[facility] History and Physical  Linda [name] [id number] DOB: December 24, 1945 DOA: 12/11/2015  Referring physician: EDP PCP: No primary care provider on file.   Chief Complaint: Abdominal pain   HPI: Linda [name] is a 70 y.o. female with h/o kidney stones, patient presents to the ED with lower abdominal pain since 1430 yesterday.  Has mucus and scant blood per rectum.  Pain worsening since then, has nausea but no vomiting.  No urinary symptoms.  No CP, no SOB.  No subjective fever or chills.  Review of Systems: Systems reviewed.  As above, otherwise negative  Past Medical History  Diagnosis Date  . Kidney stones   . Kidney stone    Past Surgical History  Procedure Laterality Date  . Total shoulder replacement    . Cesarean section    . Abdominal hysterectomy    . Knee surgery     Social History:  reports that she has never smoked. She does not have any smokeless tobacco history on file. She reports that she does not drink alcohol or use illicit drugs.  Allergies  Allergen Reactions  . Ciprofloxacin Itching  . Codeine Nausea Only  . Naproxen Hives  . Nitrofurantoin Monohyd Macro Rash  . Penicillins Rash    Has patient had a PCN reaction causing immediate rash, facial/tongue/throat swelling, SOB or lightheadedness with hypotension: No Has patient had a PCN reaction causing severe rash involving mucus membranes or skin necrosis: Yes Has patient had a PCN reaction that required hospitalization No Has patient had a PCN reaction occurring within the last 10 years: No If all of the above answers are "NO", then may proceed with Cephalosporin use.     Family History  Problem Relation Age of Onset  . Nephrolithiasis Other      Prior to Admission medications   Medication Sig Start Date End Date Taking? Authorizing Provider  acetaminophen (TYLENOL) 500 MG tablet Take 250 mg by mouth every 6 (six) hours as needed for moderate pain.   Yes Historical Provider, MD   Methocarbamol (ROBAXIN PO) Take 0.25 tablets by mouth daily as needed (muscle spasms).    Yes Historical Provider, MD   Physical Exam: Filed Vitals:   12/11/15 1751 12/11/15 1948  BP: 126/78 143/63  Pulse: 126 102  Temp: 98.1 F (36.7 C)   Resp: 20 14    BP 143/63 mmHg  Pulse 102  Temp(Src) 98.1 F (36.7 C) (Oral)  Resp 14  SpO2 94%  General Appearance:    Alert, oriented, no distress, appears stated age  Head:    Normocephalic, atraumatic  Eyes:    PERRL, EOMI, sclera non-icteric        Nose:   Nares without drainage or epistaxis. Mucosa, turbinates normal  Throat:   Moist mucous membranes. Oropharynx without erythema or exudate.  Neck:   Supple. No carotid bruits.  No thyromegaly.  No lymphadenopathy.   Back:     No CVA tenderness, no spinal tenderness  Lungs:     Clear to auscultation bilaterally, without wheezes, rhonchi or rales  Chest wall:    No tenderness to palpitation  Heart:    Regular rate and rhythm without murmurs, gallops, rubs  Abdomen:     Soft, non-tender, nondistended, normal bowel sounds, no organomegaly  Genitalia:    deferred  Rectal:    deferred  Extremities:   No clubbing, cyanosis or edema.  Pulses:   2+ and symmetric all extremities  Skin:   Skin color, texture, turgor  normal, no rashes or lesions  Lymph nodes:   Cervical, supraclavicular, and axillary nodes normal  Neurologic:   CNII-XII intact. Normal strength, sensation and reflexes      throughout    Labs on Admission:  Basic Metabolic Panel:  Recent Labs Lab 12/11/15 1800  NA 142  K 3.9  CL 106  CO2 24  GLUCOSE 114*  BUN 13  CREATININE 0.74  CALCIUM 10.3   Liver Function Tests:  Recent Labs Lab 12/11/15 1800  AST 32  ALT 55*  ALKPHOS 82  BILITOT 1.9*  PROT 7.6  ALBUMIN 4.4    Recent Labs Lab 12/11/15 1800  LIPASE 22   No results for input(s): AMMONIA in the last 168 hours. CBC:  Recent Labs Lab 12/11/15 1800  WBC 16.3*  HGB 16.2*  HCT 45.9  MCV 93.3   PLT 213   Cardiac Enzymes:  Recent Labs Lab 12/11/15 1740  TROPONINI <0.03    BNP (last 3 results) No results for input(s): PROBNP in the last 8760 hours. CBG: No results for input(s): GLUCAP in the last 168 hours.  Radiological Exams on Admission: Ct Abdomen Pelvis W Contrast  12/11/2015  CLINICAL DATA:  Lower abdominal pain EXAM: CT ABDOMEN AND PELVIS WITH CONTRAST TECHNIQUE: Multidetector CT imaging of the abdomen and pelvis was performed using the standard protocol following bolus administration of intravenous contrast. CONTRAST:  43mL OMNIPAQUE IOHEXOL 300 MG/ML SOLN, 15mL OMNIPAQUE IOHEXOL 300 MG/ML SOLN COMPARISON:  10/28/2011 FINDINGS: Lower chest: No plural effusion identified. The lung bases are clear. Calcification within the thoracic aorta and LAD coronary artery noted. Hepatobiliary: No focal liver abnormality. The gallbladder is normal. No biliary dilatation. Pancreas: Normal appearance of the pancreas. Spleen: Negative Adrenals/Urinary Tract: The adrenal glands are negative. Bilateral nephrolithiasis identified. Within the upper pole the left kidney the largest stone measures 5 mm. Left sided parapelvic cysts noted. There is a indeterminate lesion within the left kidney which measures 45 Hounsfield units and 11 mm, image 43 of series 2. The urinary bladder appears normal. Stomach/Bowel: Small hiatal hernia. The small bowel loops have a normal course and caliber. There is no obstruction. 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 Abnormal wall thickening and inflammation involves the sigmoid colon compatible with acute diverticulitis. No significant free air to suggest perforation. No abscess. Vascular/Lymphatic: Calcified atherosclerotic disease involves the abdominal aorta. No aneurysm. No enlarged retroperitoneal or mesenteric adenopathy. No enlarged pelvic or inguinal lymph nodes. Reproductive: Previous hysterectomy.  No adnexal mass. Other: No fluid collection  identified. There is a small amount of free fluid within the pelvis. 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 There is an anterolisthesis of L4 on L5. IMPRESSION: 1. Findings compatible with acute diverticulitis involving the sigmoid colon. No significant free air in no abscess identified. 2. Kidney stones 3. Indeterminate, intermediate attenuating structure arises from the inferior pole the left kidney. This may represent a complex cyst or enhancing renal lesion. Recommend followup imaging with nonemergent contrast enhanced MRI of the kidneys. 4. Aortic atherosclerosis and LAD coronary artery calcification. Electronically Signed   By: Kerby Moors M.D.   On: 12/11/2015 20:20   Dg Abd Acute W/chest  12/11/2015  CLINICAL DATA:  Lower abdominal pain since yesterday. EXAM: DG ABDOMEN ACUTE W/ 1V CHEST COMPARISON:  Chest radiograph 07/13/2010 and abdominal CT 10/28/2011 FINDINGS: Left shoulder arthroplasty. Few densities at the left lung base may represent atelectasis. Otherwise, the lungs are clear. Heart size is normal. Negative for free air.7  mm calcification overlying the left renal shadow could represent a renal calculus. Calcifications in the pelvis probably represent phleboliths. Nonobstructive bowel gas pattern with gas in the colon. Degenerative changes in lower lumbar spine. IMPRESSION: Probable left renal calculus. No acute chest abnormality. Electronically Signed   By: Markus Daft M.D.   On: 12/11/2015 18:28    EKG: Independently reviewed.  Assessment/Plan Active Problems:   Diverticulitis   1. Diverticulitis - 1. Acute uncomplicated diverticulitis 2. Empiric rocephin / flagyl (allergy to PCN and cipro) 3. IVF 4. Clear liquid diet 5. Morphine for pain ctrl 6. Repeat CBC in AM to trend leukocytosis 7. Tylenol PRN fever 8. Due to blood in stool, SCDs only for DVT ppx    Code Status: full  Family Communication: Family at bedside Disposition  Plan: Admit to obs   Time spent: 70 min  GARDNER, JARED M. [facility] Pager [phone]  If 7AM-7PM, please contact the day team taking care of the patient [email or web address] Password [email or web address] 12/11/2015, 9:07 PM

## 2015-12-11 NOTE — ED Notes (Signed)
Pt reports low abd pain since1430 yesterday.  Pt reports nausea but denies any vomiting.  Pt noticed "slight mucus" from her rectum but denies any vaginal bleeding or urinary sxs at this time.

## 2015-12-11 NOTE — Progress Notes (Signed)
Patient listed as having [employer] without a pcp.  Patient confirms her pcp is Dr. Cyndi Bender of [place] updated.

## 2015-12-11 NOTE — Progress Notes (Signed)
Pharmacy Antibiotic Note  Linda [name] is a 70 y.o. female admitted on 12/11/2015 with abdominal pain.  Pharmacy has been consulted for ceftriaxone dosing for intra-abdominal infection.  Plan:  Ceftriaxone 1g IV q24h  No further dosing adjustments needed - Pharmacy will sign off  Flagyl 500mg  IV q8h per MD    Temp (24hrs), Avg:98.1 F (36.7 C), Min:98.1 F (36.7 C), Max:98.1 F (36.7 C)   Recent Labs Lab 12/11/15 1800 12/11/15 1806 12/11/15 2106  WBC 16.3*  --   --   CREATININE 0.74  --   --   LATICACIDVEN  --  1.58 1.63    CrCl cannot be calculated (Unknown ideal weight.).  75 ml/min/1.60m2 (normalized)  Allergies  Allergen Reactions  . Ciprofloxacin Itching  . Codeine Nausea Only  . Naproxen Hives  . Nitrofurantoin Monohyd Macro Rash  . Penicillins Rash    Has patient had a PCN reaction causing immediate rash, facial/tongue/throat swelling, SOB or lightheadedness with hypotension: No Has patient had a PCN reaction causing severe rash involving mucus membranes or skin necrosis: Yes Has patient had a PCN reaction that required hospitalization No Has patient had a PCN reaction occurring within the last 10 years: No If all of the above answers are "NO", then may proceed with Cephalosporin use.     Antimicrobials this admission: 1/31 >> ceftriaxone >> 1/31 >> flagyl >>  Dose adjustments this admission: ---  Microbiology results: 1/31 BCx: sent 1/31 UCx: ordered   Thank you for allowing pharmacy to be a part of this patient's care.  Peggyann Juba, PharmD, BCPS Pager: [phone] 12/11/2015 9:09 PM

## 2015-12-11 NOTE — ED Provider Notes (Signed)
CSN: [id number]     Arrival date & time 12/11/15  1736 History   First MD Initiated Contact with Patient 12/11/15 1753     Chief Complaint  Patient presents with  . Abdominal Pain     (Consider location/radiation/quality/duration/timing/severity/associated sxs/prior Treatment) HPI Comments: Patient with severe lower abdominal pain that has been worsening since yesterday afternoon. Associated with nausea but no vomiting. Pain started after she had a normal bowel movement. She reports some mucus from her rectum and a scant amount of blood saturated was to hemorrhoids. Pain is constant is improved with a heating pad and worse with palpation. She has no appetite today. No fever. No urinary or vaginal symptoms. She's has a history of kidney stones in the past but this feels different. History of hysterectomy was still has ovaries. Denies any chest pain or shortness of breath. Denies any fevers or chills.  The history is provided by the patient. The history is limited by the condition of the patient.    Past Medical History  Diagnosis Date  . Kidney stones   . Kidney stone    Past Surgical History  Procedure Laterality Date  . Total shoulder replacement    . Cesarean section    . Abdominal hysterectomy    . Knee surgery     Family History  Problem Relation Age of Onset  . Nephrolithiasis Other    Social History  Substance Use Topics  . Smoking status: Never Smoker   . Smokeless tobacco: None  . Alcohol Use: No   OB History    No data available     Review of Systems  Constitutional: Positive for activity change and appetite change. Negative for fever.  HENT: Negative for congestion and rhinorrhea.   Respiratory: Negative for cough, chest tightness and shortness of breath.   Gastrointestinal: Positive for nausea, vomiting and abdominal pain.  Genitourinary: Negative for dysuria, hematuria, vaginal bleeding and vaginal discharge.  Musculoskeletal: Negative for myalgias and  arthralgias.  Skin: Negative for rash.  Neurological: Negative for dizziness, weakness and headaches.  A complete 10 system review of systems was obtained and all systems are negative except as noted in the HPI and PMH.      Allergies  Ciprofloxacin; Codeine; Naproxen; Nitrofurantoin monohyd macro; and Penicillins  Home Medications   Prior to Admission medications   Medication Sig Start Date End Date Taking? Authorizing Provider  acetaminophen (TYLENOL) 500 MG tablet Take 250 mg by mouth every 6 (six) hours as needed for moderate pain.   Yes Historical Provider, MD  Methocarbamol (ROBAXIN PO) Take 0.25 tablets by mouth daily as needed (muscle spasms).    Yes Historical Provider, MD   BP 129/66 mmHg  Pulse 102  Temp(Src) 99.1 F (37.3 C) (Oral)  Resp 20  SpO2 93% Physical Exam  Constitutional: She is oriented to person, place, and time. She appears well-developed and well-nourished. She appears distressed.  unfcomfortable  HENT:  Head: Normocephalic and atraumatic.  Mouth/Throat: Oropharynx is clear and moist. No oropharyngeal exudate.  Eyes: Conjunctivae and EOM are normal. Pupils are equal, round, and reactive to light.  Neck: Normal range of motion. Neck supple.  No meningismus.  Cardiovascular: Normal rate, normal heart sounds and intact distal pulses.   No murmur heard. Tachycardic  Pulmonary/Chest: Effort normal and breath sounds normal. No respiratory distress.  Abdominal: Soft. There is tenderness. There is guarding. There is no rebound.  Diffuse lower abdominal tenderness, worse periumbilically with rebound.  Genitourinary: Guaiac positive stool.  Chaperone present. External hemorrhoids. No gross blood.  Musculoskeletal: Normal range of motion. She exhibits no edema or tenderness.  Neurological: She is alert and oriented to person, place, and time. No cranial nerve deficit. She exhibits normal muscle tone. Coordination normal.  No ataxia on finger to nose  bilaterally. No pronator drift. 5/5 strength throughout. CN 2-12 intact.Equal grip strength. Sensation intact.   Skin: Skin is warm.  Psychiatric: She has a normal mood and affect. Her behavior is normal.  Nursing note and vitals reviewed.   ED Course  Procedures (including critical care time) Labs Review Labs Reviewed  COMPREHENSIVE METABOLIC PANEL - Abnormal; Notable for the following:    Glucose, Bld 114 (*)    ALT 55 (*)    Total Bilirubin 1.9 (*)    All other components within normal limits  CBC - Abnormal; Notable for the following:    WBC 16.3 (*)    Hemoglobin 16.2 (*)    All other components within normal limits  URINALYSIS, ROUTINE W REFLEX MICROSCOPIC (NOT AT [facility]) - Abnormal; Notable for the following:    APPearance CLOUDY (*)    Ketones, ur 15 (*)    Leukocytes, UA LARGE (*)    All other components within normal limits  URINE MICROSCOPIC-ADD ON - Abnormal; Notable for the following:    Squamous Epithelial / LPF 6-30 (*)    Bacteria, UA RARE (*)    All other components within normal limits  POC OCCULT BLOOD, ED - Abnormal; Notable for the following:    Fecal Occult Bld POSITIVE (*)    All other components within normal limits  URINE CULTURE  CULTURE, BLOOD (ROUTINE X 2)  CULTURE, BLOOD (ROUTINE X 2)  LIPASE, BLOOD  TROPONIN I  CBC  BASIC METABOLIC PANEL  I-STAT CG4 LACTIC ACID, ED  I-STAT CG4 LACTIC ACID, ED    Imaging Review Ct Abdomen Pelvis W Contrast  12/11/2015  CLINICAL DATA:  Lower abdominal pain EXAM: CT ABDOMEN AND PELVIS WITH CONTRAST TECHNIQUE: Multidetector CT imaging of the abdomen and pelvis was performed using the standard protocol following bolus administration of intravenous contrast. CONTRAST:  59mL OMNIPAQUE IOHEXOL 300 MG/ML SOLN, 169mL OMNIPAQUE IOHEXOL 300 MG/ML SOLN COMPARISON:  10/28/2011 FINDINGS: Lower chest: No plural effusion identified. The lung bases are clear. Calcification within the thoracic aorta and LAD coronary artery noted.  Hepatobiliary: No focal liver abnormality. The gallbladder is normal. No biliary dilatation. Pancreas: Normal appearance of the pancreas. Spleen: Negative Adrenals/Urinary Tract: The adrenal glands are negative. Bilateral nephrolithiasis identified. Within the upper pole the left kidney the largest stone measures 5 mm. Left sided parapelvic cysts noted. There is a indeterminate lesion within the left kidney which measures 45 Hounsfield units and 11 mm, image 43 of series 2. The urinary bladder appears normal. Stomach/Bowel: Small hiatal hernia. The small bowel loops have a normal course and caliber. There is no obstruction. 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 Abnormal wall thickening and inflammation involves the sigmoid colon compatible with acute diverticulitis. No significant free air to suggest perforation. No abscess. Vascular/Lymphatic: Calcified atherosclerotic disease involves the abdominal aorta. No aneurysm. No enlarged retroperitoneal or mesenteric adenopathy. No enlarged pelvic or inguinal lymph nodes. Reproductive: Previous hysterectomy.  No adnexal mass. Other: No fluid collection identified. There is a small amount of free fluid within the pelvis. 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 There is an anterolisthesis of L4 on L5. IMPRESSION: 1. Findings compatible with acute diverticulitis involving the sigmoid  colon. No significant free air in no abscess identified. 2. Kidney stones 3. Indeterminate, intermediate attenuating structure arises from the inferior pole the left kidney. This may represent a complex cyst or enhancing renal lesion. Recommend followup imaging with nonemergent contrast enhanced MRI of the kidneys. 4. Aortic atherosclerosis and LAD coronary artery calcification. Electronically Signed   By: Kerby Moors M.D.   On: 12/11/2015 20:20   Dg Abd Acute W/chest  12/11/2015  CLINICAL DATA:  Lower abdominal pain since  yesterday. EXAM: DG ABDOMEN ACUTE W/ 1V CHEST COMPARISON:  Chest radiograph 07/13/2010 and abdominal CT 10/28/2011 FINDINGS: Left shoulder arthroplasty. Few densities at the left lung base may represent atelectasis. Otherwise, the lungs are clear. Heart size is normal. Negative for free air.7 mm calcification overlying the left renal shadow could represent a renal calculus. Calcifications in the pelvis probably represent phleboliths. Nonobstructive bowel gas pattern with gas in the colon. Degenerative changes in lower lumbar spine. IMPRESSION: Probable left renal calculus. No acute chest abnormality. Electronically Signed   By: Markus Daft M.D.   On: 12/11/2015 18:28   I have personally reviewed and evaluated these images and lab results as part of my medical decision-making.   EKG Interpretation   Date/Time:  Tuesday December 11 2015 18:01:13 EST Ventricular Rate:  115 PR Interval:  132 QRS Duration: 64 QT Interval:  261 QTC Calculation: 361 R Axis:   44 Text Interpretation:  Sinus tachycardia Nonspecific repol abnormality,  diffuse leads new ST depressions inferior latereally Confirmed by Wyvonnia Dusky   MD, Jeriah Skufca [phone]) on 12/11/2015 6:10:23 PM      MDM   Final diagnoses:  Diverticulitis large intestine w/o perforation or abscess w/o bleeding   Lower abdominal pain that is severe with tachycardia. We'll obtain labs, x-ray to rule out free air.  EKG shows new ST depression inferior, laterally. She denies chest pain.  FOBT positive. Leukocytosis, lactate normal. Troponin negative.  UA appears infected. Culture will be sent. Rocephin started. CT scan discussed with Dr. Clovis Riley. There are no ureteral stones. There are multiple kidney stones. No hydronephrosis. There is diverticulosis of sigmoid colon without abscess or perforation. He cannot rule out a fistula with the bladder.  Patient remains with pain and tachycardia.   Plan admission for IV antibiotics and IV fluids. Treat for  diverticulitis and UTI. EKG has new ST depressions but she has no chest pain or shortness of breath. Discussed with Dr. Alcario Drought.  Ezequiel Essex, MD 12/11/15 [phone]

## 2015-12-11 NOTE — ED Notes (Signed)
Patient transported to X-ray 

## 2015-12-12 DIAGNOSIS — K5733 Diverticulitis of large intestine without perforation or abscess with bleeding: Secondary | ICD-10-CM | POA: Diagnosis not present

## 2015-12-12 DIAGNOSIS — N289 Disorder of kidney and ureter, unspecified: Secondary | ICD-10-CM

## 2015-12-12 LAB — TROPONIN I
Troponin I: <0.03 ng/mL (ref <0.031)
Troponin I: <0.03 ng/mL (ref <0.031)
Troponin I: <0.03 ng/mL (ref <0.031)

## 2015-12-12 LAB — CBC
HCT: 40 % (ref 36.0–46.0)
Hemoglobin: 13.3 g/dL (ref 12.0–15.0)
MCH: 32.1 pg (ref 26.0–34.0)
MCHC: 33.3 g/dL (ref 30.0–36.0)
MCV: 96.6 fL (ref 78.0–100.0)
Platelets: 181 10*3/uL (ref 150–400)
RBC: 4.14 MIL/uL (ref 3.87–5.11)
RDW: 13.5 % (ref 11.5–15.5)
WBC: 13.9 10*3/uL — ABNORMAL HIGH (ref 4.0–10.5)

## 2015-12-12 LAB — BASIC METABOLIC PANEL
Anion gap: 8 (ref 5–15)
BUN: 11 mg/dL (ref 6–20)
CO2: 25 mmol/L (ref 22–32)
Calcium: 9 mg/dL (ref 8.9–10.3)
Chloride: 107 mmol/L (ref 101–111)
Creatinine, Ser: 0.7 mg/dL (ref 0.44–1.00)
GFR calc Af Amer: >60 mL/min (ref >60)
GFR calc non Af Amer: >60 mL/min (ref >60)
Glucose, Bld: 131 mg/dL — ABNORMAL HIGH (ref 65–99)
Potassium: 4.1 mmol/L (ref 3.5–5.1)
Sodium: 140 mmol/L (ref 135–145)

## 2015-12-12 MED ORDER — PANTOPRAZOLE SODIUM 40 MG PO TBEC
40.0000 mg | DELAYED_RELEASE_TABLET | Freq: Every day | ORAL | Status: DC
  Administered 2015-12-13 – 2015-12-15 (×3): 40 mg via ORAL
  Filled 2015-12-12 (×4): qty 1

## 2015-12-12 MED ORDER — KCL IN DEXTROSE-NACL 20-5-0.9 MEQ/L-%-% IV SOLN
INTRAVENOUS | Status: AC
  Administered 2015-12-12 – 2015-12-14 (×3): via INTRAVENOUS
  Filled 2015-12-12 (×5): qty 1000

## 2015-12-12 MED ORDER — ONDANSETRON HCL 4 MG/2ML IJ SOLN: 4.0000 mg | Freq: Four times a day (QID) | INTRAMUSCULAR | Status: DC | PRN

## 2015-12-12 NOTE — Care Management Obs Status (Signed)
[facility] NOTIFICATION   Patient Details  Name: Linda [name] MRN: [id number] Date of Birth: 1946-01-20   Medicare Observation Status Notification Given:  Yes    Guadalupe Maple, RN 12/12/2015, 3:19 PM

## 2015-12-12 NOTE — Progress Notes (Addendum)
Linda [name] [id number] DOB: 14-Mar-1946 DOA: 12/11/2015 PCP: PROVIDER NOT IN SYSTEM  Assessment at time of admission by Dr Alcario Drought on 12/11/15 DNIA STREIGHT is a 70 y.o. female with h/o kidney stones, patient presents to the ED with lower abdominal pain since 1430 yesterday. Has mucus and scant blood per rectum. Pain worsening since then, has nausea but no vomiting. No urinary symptoms. No CP, no SOB. No subjective fever or chills. Summary&Daily Progress Notes since admission 12/12/15: Patient found to have sigmoid diverticulitis by CT abdomen pelvis "1. Findings compatible with acute diverticulitis involving the sigmoid colon. No significant free air in no abscess identified. 2. Kidney stones 3. Indeterminate, intermediate attenuating structure arises from the inferior pole the left kidney. This may represent a complex cyst or enhancing renal lesion. Recommend followup imaging with nonemergent contrast enhanced MRI of the kidneys. 4. Aortic atherosclerosis and LAD coronary artery calcification". White count was 16,300 time of admission. She reports improvement although she still has abdominal pain. She is not interested in colonoscopy which I have recommended, to be considered once inflammation has subsided. She will also need follow-up for her abnormal kidney lesion. Will continue Ceftriaxone/Flagyl, check CEA and give D5 normal saline with KCl, also PPI. Hopefully DC home in the next 48-72 hours if she continues to do well. Continue clear liquid diet. Problem List Plan  Active Problems:   Diverticulitis   Kidney lesion   Day 2 Ceftriaxone/Flagyl  D5WNs  CEA  Protonix  Code Status: Full Code Family Communication: sister at bedside Disposition Plan: Home in the next 49 to 72 hours if she continues to do well Consultants:  None Procedures:  None Antibiotics:  Ceftriaxone 12/11/2015>>  Flagyl 12/11/2015>>  HPI/Subjective: Complains of abdominal pain. Has no  appetite.  Objective: Filed Vitals:   12/12/15 1137 12/12/15 1336  BP: 117/62 126/74  Pulse: 82 86  Temp: 98.1 F (36.7 C) 98 F (36.7 C)  Resp: 18 20    Intake/Output Summary (Last 24 hours) at 12/12/15 1730 Last data filed at 12/12/15 1011  Gross per 24 hour  Intake 1518.33 ml  Output      0 ml  Net 1518.33 ml   Filed Weights   12/11/15 2359  Weight: 79.2 kg (174 lb 9.7 oz)    Exam:   General:  Comfortable at rest.  Cardiovascular: S1-S2 normal. No murmurs. Pulse regular.  Respiratory: Good air entry bilaterally. No rhonchi or rales.  Abdomen: Soft but tender lower abdomen. Normal bowel sounds. No organomegaly.  Musculoskeletal: No pedal edema   Neurological: Intact  Data Reviewed: Basic Metabolic Panel:  Recent Labs Lab 12/11/15 1800 12/12/15 0220  NA 142 140  K 3.9 4.1  CL 106 107  CO2 24 25  GLUCOSE 114* 131*  BUN 13 11  CREATININE 0.74 0.70  CALCIUM 10.3 9.0   Liver Function Tests:  Recent Labs Lab 12/11/15 1800  AST 32  ALT 55*  ALKPHOS 82  BILITOT 1.9*  PROT 7.6  ALBUMIN 4.4    Recent Labs Lab 12/11/15 1800  LIPASE 22   No results for input(s): AMMONIA in the last 168 hours. CBC:  Recent Labs Lab 12/11/15 1800 12/12/15 0220  WBC 16.3* 13.9*  HGB 16.2* 13.3  HCT 45.9 40.0  MCV 93.3 96.6  PLT 213 181   Cardiac Enzymes:  Recent Labs Lab 12/11/15 1740 12/12/15 0220 12/12/15 0808 12/12/15 1533  TROPONINI <0.03 <0.03 <0.03 <0.03   BNP (last 3 results) No results for input(s): BNP  in the last 8760 hours.  ProBNP (last 3 results) No results for input(s): PROBNP in the last 8760 hours.  CBG: No results for input(s): GLUCAP in the last 168 hours.  Recent Results (from the past 240 hour(s))  Blood culture (routine x 2)     Status: None (Preliminary result)   Collection Time: 12/11/15  8:53 PM  Result Value Ref Range Status   Specimen Description BLOOD LEFT ANTECUBITAL  Final   Special Requests BOTTLES DRAWN  AEROBIC AND ANAEROBIC 5 ML  Final   Culture   Final    NO GROWTH < 12 HOURS Performed at [facility]    Report Status PENDING  Incomplete     Studies: Ct Abdomen Pelvis W Contrast  12/11/2015  CLINICAL DATA:  Lower abdominal pain EXAM: CT ABDOMEN AND PELVIS WITH CONTRAST TECHNIQUE: Multidetector CT imaging of the abdomen and pelvis was performed using the standard protocol following bolus administration of intravenous contrast. CONTRAST:  50mL OMNIPAQUE IOHEXOL 300 MG/ML SOLN, 136mL OMNIPAQUE IOHEXOL 300 MG/ML SOLN COMPARISON:  10/28/2011 FINDINGS: Lower chest: No plural effusion identified. The lung bases are clear. Calcification within the thoracic aorta and LAD coronary artery noted. Hepatobiliary: No focal liver abnormality. The gallbladder is normal. No biliary dilatation. Pancreas: Normal appearance of the pancreas. Spleen: Negative Adrenals/Urinary Tract: The adrenal glands are negative. Bilateral nephrolithiasis identified. Within the upper pole the left kidney the largest stone measures 5 mm. Left sided parapelvic cysts noted. There is a indeterminate lesion within the left kidney which measures 45 Hounsfield units and 11 mm, image 43 of series 2. The urinary bladder appears normal. Stomach/Bowel: Small hiatal hernia. The small bowel loops have a normal course and caliber. There is no obstruction. 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 Abnormal wall thickening and inflammation involves the sigmoid colon compatible with acute diverticulitis. No significant free air to suggest perforation. No abscess. Vascular/Lymphatic: Calcified atherosclerotic disease involves the abdominal aorta. No aneurysm. No enlarged retroperitoneal or mesenteric adenopathy. No enlarged pelvic or inguinal lymph nodes. Reproductive: Previous hysterectomy.  No adnexal mass. Other: No fluid collection identified. There is a small amount of free fluid within the pelvis. 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 There is an anterolisthesis of L4 on L5. IMPRESSION: 1. Findings compatible with acute diverticulitis involving the sigmoid colon. No significant free air in no abscess identified. 2. Kidney stones 3. Indeterminate, intermediate attenuating structure arises from the inferior pole the left kidney. This may represent a complex cyst or enhancing renal lesion. Recommend followup imaging with nonemergent contrast enhanced MRI of the kidneys. 4. Aortic atherosclerosis and LAD coronary artery calcification. Electronically Signed   By: Kerby Moors M.D.   On: 12/11/2015 20:20   Dg Abd Acute W/chest  12/11/2015  CLINICAL DATA:  Lower abdominal pain since yesterday. EXAM: DG ABDOMEN ACUTE W/ 1V CHEST COMPARISON:  Chest radiograph 07/13/2010 and abdominal CT 10/28/2011 FINDINGS: Left shoulder arthroplasty. Few densities at the left lung base may represent atelectasis. Otherwise, the lungs are clear. Heart size is normal. Negative for free air.7 mm calcification overlying the left renal shadow could represent a renal calculus. Calcifications in the pelvis probably represent phleboliths. Nonobstructive bowel gas pattern with gas in the colon. Degenerative changes in lower lumbar spine. IMPRESSION: Probable left renal calculus. No acute chest abnormality. Electronically Signed   By: Markus Daft M.D.   On: 12/11/2015 18:28    Scheduled Meds: . cefTRIAXone (ROCEPHIN)  IV  1 g Intravenous  Q24H  . metronidazole  500 mg Intravenous Q8H  . [START ON 12/13/2015] pantoprazole  40 mg Oral Q1200   Continuous Infusions: . dextrose 5 % and 0.9 % NaCl with KCl 20 mEq/L       Time spent: 25 minutes    Tuff Clabo  [facility] Pager [phone]. If 7PM-7AM, please contact night-coverage at [email or web address], password [facility] 12/12/2015, 5:30 PM

## 2015-12-13 DIAGNOSIS — K5733 Diverticulitis of large intestine without perforation or abscess with bleeding: Secondary | ICD-10-CM | POA: Diagnosis not present

## 2015-12-13 LAB — COMPREHENSIVE METABOLIC PANEL
ALT: 28 U/L (ref 14–54)
AST: 18 U/L (ref 15–41)
Albumin: 3.1 g/dL — ABNORMAL LOW (ref 3.5–5.0)
Alkaline Phosphatase: 63 U/L (ref 38–126)
Anion gap: 8 (ref 5–15)
BUN: 8 mg/dL (ref 6–20)
CO2: 25 mmol/L (ref 22–32)
Calcium: 8.7 mg/dL — ABNORMAL LOW (ref 8.9–10.3)
Chloride: 106 mmol/L (ref 101–111)
Creatinine, Ser: 0.67 mg/dL (ref 0.44–1.00)
GFR calc Af Amer: >60 mL/min (ref >60)
GFR calc non Af Amer: >60 mL/min (ref >60)
Glucose, Bld: 109 mg/dL — ABNORMAL HIGH (ref 65–99)
Potassium: 3.7 mmol/L (ref 3.5–5.1)
Sodium: 139 mmol/L (ref 135–145)
Total Bilirubin: 0.7 mg/dL (ref 0.3–1.2)
Total Protein: 6 g/dL — ABNORMAL LOW (ref 6.5–8.1)

## 2015-12-13 LAB — URINE CULTURE

## 2015-12-13 LAB — CBC WITH DIFFERENTIAL/PLATELET
Basophils Absolute: 0 10*3/uL (ref 0.0–0.1)
Basophils Relative: 0 %
Eosinophils Absolute: 0.2 10*3/uL (ref 0.0–0.7)
Eosinophils Relative: 2 %
HCT: 39.3 % (ref 36.0–46.0)
Hemoglobin: 13.1 g/dL (ref 12.0–15.0)
Lymphocytes Relative: 19 %
Lymphs Abs: 2.1 10*3/uL (ref 0.7–4.0)
MCH: 32.2 pg (ref 26.0–34.0)
MCHC: 33.3 g/dL (ref 30.0–36.0)
MCV: 96.6 fL (ref 78.0–100.0)
Monocytes Absolute: 1 10*3/uL (ref 0.1–1.0)
Monocytes Relative: 9 %
Neutro Abs: 7.6 10*3/uL (ref 1.7–7.7)
Neutrophils Relative %: 70 %
Platelets: 179 10*3/uL (ref 150–400)
RBC: 4.07 MIL/uL (ref 3.87–5.11)
RDW: 13.6 % (ref 11.5–15.5)
WBC: 10.9 10*3/uL — ABNORMAL HIGH (ref 4.0–10.5)

## 2015-12-13 NOTE — Progress Notes (Signed)
Linda [name] [id number] DOB: October 20, 1946 DOA: 12/11/2015 PCP: PROVIDER NOT IN SYSTEM  Assessment at time of admission by Dr Alcario Drought on 12/11/15 Linda [name] is a 70 y.o. female with h/o kidney stones, patient presents to the ED with lower abdominal pain since 1430 yesterday. Has mucus and scant blood per rectum. Pain worsening since then, has nausea but no vomiting. No urinary symptoms. No CP, no SOB. No subjective fever or chills. Summary&Daily Progress Notes since admission 12/12/15: Patient found to have sigmoid diverticulitis by CT abdomen pelvis "1. Findings compatible with acute diverticulitis involving the sigmoid colon. No significant free air in no abscess identified. 2. Kidney stones 3. Indeterminate, intermediate attenuating structure arises from the inferior pole the left kidney. This may represent a complex cyst or enhancing renal lesion. Recommend followup imaging with nonemergent contrast enhanced MRI of the kidneys. 4. Aortic atherosclerosis and LAD coronary artery calcification". White count was 16,300 time of admission. She reports improvement although she still has abdominal pain. She is not interested in colonoscopy which I have recommended, to be considered once inflammation has subsided. She will also need follow-up for her abnormal kidney lesion. Will continue Ceftriaxone/Flagyl, check CEA and give D5 normal saline with KCl, also PPI. Hopefully DC home in the next 48-72 hours if she continues to do well. Continue clear liquid diet. 12/13/15: White count has improved to 10,900. However, patient still has abdominal pain which is better. Will continue current antibiotics to complete 2 weeks of antibiotics, follow CEA, obtain TSH/hemoglobin A1c and consider repeat CT abdomen and pelvis if pain persists. Will continue clear liquid diet. Patient will need GI follow-up outpatient for colonoscopy. She seems more open to it. Problem List Plan  Active Problems:   Diverticulitis    Kidney lesion   Day 3 Ceftriaxone/Flagyl  Follow CEA  TSH/hemoglobin A1c   Continue clear liquid diet   Consider repeat CT abdomen pelvis if abdominal pain persists as possibility of complications of diverticulitis   Code Status: Full Code Family Communication: sister at bedside Disposition Plan: Home in the next 69 to 72 hours if she continues to do well Consultants:  None Procedures:  None Antibiotics:  Ceftriaxone 12/11/2015>>  Flagyl 12/11/2015>>  HPI/Subjective: She doesn't feel like eating, still has abdominal pain which is somewhat better.  Objective: Filed Vitals:   12/13/15 0800 12/13/15 1400  BP: 132/81 140/74  Pulse: 86 87  Temp: 98 F (36.7 C) 98.4 F (36.9 C)  Resp: 20 20    Intake/Output Summary (Last 24 hours) at 12/13/15 1852 Last data filed at 12/13/15 0900  Gross per 24 hour  Intake    100 ml  Output      0 ml  Net    100 ml   Filed Weights   12/11/15 2359  Weight: 79.2 kg (174 lb 9.7 oz)    Exam:   General:  Comfortable at rest.  Cardiovascular: S1-S2 normal. No murmurs. Pulse regular.  Respiratory: Good air entry bilaterally. No rhonchi or rales.  Abdomen: Soft, lower abdomen tenderness to deep palpation. Normal bowel sounds. No organomegaly.  Musculoskeletal: No pedal edema   Neurological: Intact  Data Reviewed: Basic Metabolic Panel:  Recent Labs Lab 12/11/15 1800 12/12/15 0220 12/13/15 0531  NA 142 140 139  K 3.9 4.1 3.7  CL 106 107 106  CO2 24 25 25   GLUCOSE 114* 131* 109*  BUN 13 11 8   CREATININE 0.74 0.70 0.67  CALCIUM 10.3 9.0 8.7*   Liver Function Tests:  Recent Labs Lab 12/11/15 1800 12/13/15 0531  AST 32 18  ALT 55* 28  ALKPHOS 82 63  BILITOT 1.9* 0.7  PROT 7.6 6.0*  ALBUMIN 4.4 3.1*    Recent Labs Lab 12/11/15 1800  LIPASE 22   No results for input(s): AMMONIA in the last 168 hours. CBC:  Recent Labs Lab 12/11/15 1800 12/12/15 0220 12/13/15 0531  WBC 16.3* 13.9* 10.9*   NEUTROABS  --   --  7.6  HGB 16.2* 13.3 13.1  HCT 45.9 40.0 39.3  MCV 93.3 96.6 96.6  PLT 213 181 179   Cardiac Enzymes:  Recent Labs Lab 12/11/15 1740 12/12/15 0220 12/12/15 0808 12/12/15 1533  TROPONINI <0.03 <0.03 <0.03 <0.03   BNP (last 3 results) No results for input(s): BNP in the last 8760 hours.  ProBNP (last 3 results) No results for input(s): PROBNP in the last 8760 hours.  CBG: No results for input(s): GLUCAP in the last 168 hours.  Recent Results (from the past 240 hour(s))  Urine culture     Status: None   Collection Time: 12/11/15  7:51 PM  Result Value Ref Range Status   Specimen Description URINE, CLEAN CATCH  Final   Special Requests NONE  Final   Culture   Final    MULTIPLE SPECIES PRESENT, SUGGEST RECOLLECTION Performed at [facility]    Report Status 12/13/2015 FINAL  Final  Blood culture (routine x 2)     Status: None (Preliminary result)   Collection Time: 12/11/15  8:27 PM  Result Value Ref Range Status   Specimen Description BLOOD RIGHT AC  Final   Special Requests BOTTLES DRAWN AEROBIC AND ANAEROBIC 5CC  Final   Culture   Final    NO GROWTH 1 DAY Performed at [facility]    Report Status PENDING  Incomplete  Blood culture (routine x 2)     Status: None (Preliminary result)   Collection Time: 12/11/15  8:53 PM  Result Value Ref Range Status   Specimen Description BLOOD LEFT ANTECUBITAL  Final   Special Requests BOTTLES DRAWN AEROBIC AND ANAEROBIC 5 ML  Final   Culture   Final    NO GROWTH 2 DAYS Performed at [facility]    Report Status PENDING  Incomplete     Studies: Ct Abdomen Pelvis W Contrast  12/11/2015  CLINICAL DATA:  Lower abdominal pain EXAM: CT ABDOMEN AND PELVIS WITH CONTRAST TECHNIQUE: Multidetector CT imaging of the abdomen and pelvis was performed using the standard protocol following bolus administration of intravenous contrast. CONTRAST:  55mL OMNIPAQUE IOHEXOL 300 MG/ML SOLN, 146mL  OMNIPAQUE IOHEXOL 300 MG/ML SOLN COMPARISON:  10/28/2011 FINDINGS: Lower chest: No plural effusion identified. The lung bases are clear. Calcification within the thoracic aorta and LAD coronary artery noted. Hepatobiliary: No focal liver abnormality. The gallbladder is normal. No biliary dilatation. Pancreas: Normal appearance of the pancreas. Spleen: Negative Adrenals/Urinary Tract: The adrenal glands are negative. Bilateral nephrolithiasis identified. Within the upper pole the left kidney the largest stone measures 5 mm. Left sided parapelvic cysts noted. There is a indeterminate lesion within the left kidney which measures 45 Hounsfield units and 11 mm, image 43 of series 2. The urinary bladder appears normal. Stomach/Bowel: Small hiatal hernia. The small bowel loops have a normal course and caliber. There is no obstruction. 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 Abnormal wall thickening and inflammation involves the sigmoid colon compatible with acute diverticulitis. No significant free air to suggest perforation. No abscess.  Vascular/Lymphatic: Calcified atherosclerotic disease involves the abdominal aorta. No aneurysm. No enlarged retroperitoneal or mesenteric adenopathy. No enlarged pelvic or inguinal lymph nodes. Reproductive: Previous hysterectomy.  No adnexal mass. Other: No fluid collection identified. There is a small amount of free fluid within the pelvis. 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 There is an anterolisthesis of L4 on L5. IMPRESSION: 1. Findings compatible with acute diverticulitis involving the sigmoid colon. No significant free air in no abscess identified. 2. Kidney stones 3. Indeterminate, intermediate attenuating structure arises from the inferior pole the left kidney. This may represent a complex cyst or enhancing renal lesion. Recommend followup imaging with nonemergent contrast enhanced MRI of the kidneys. 4. Aortic  atherosclerosis and LAD coronary artery calcification. Electronically Signed   By: Kerby Moors M.D.   On: 12/11/2015 20:20    Scheduled Meds: . cefTRIAXone (ROCEPHIN)  IV  1 g Intravenous Q24H  . metronidazole  500 mg Intravenous Q8H  . pantoprazole  40 mg Oral Q1200   Continuous Infusions: . dextrose 5 % and 0.9 % NaCl with KCl 20 mEq/L 75 mL/hr at 12/13/15 1033     Time spent: 25 minutes    Feiga Nadel  [facility] Pager [phone]. If 7PM-7AM, please contact night-coverage at [email or web address], password [facility] 12/13/2015, 6:52 PM

## 2015-12-14 DIAGNOSIS — K5733 Diverticulitis of large intestine without perforation or abscess with bleeding: Secondary | ICD-10-CM | POA: Diagnosis not present

## 2015-12-14 LAB — BASIC METABOLIC PANEL
Anion gap: 6 (ref 5–15)
BUN: 6 mg/dL (ref 6–20)
CO2: 24 mmol/L (ref 22–32)
Calcium: 9 mg/dL (ref 8.9–10.3)
Chloride: 112 mmol/L — ABNORMAL HIGH (ref 101–111)
Creatinine, Ser: 0.67 mg/dL (ref 0.44–1.00)
GFR calc Af Amer: >60 mL/min (ref >60)
GFR calc non Af Amer: >60 mL/min (ref >60)
Glucose, Bld: 109 mg/dL — ABNORMAL HIGH (ref 65–99)
Potassium: 3.7 mmol/L (ref 3.5–5.1)
Sodium: 142 mmol/L (ref 135–145)

## 2015-12-14 LAB — CBC
HCT: 37.6 % (ref 36.0–46.0)
Hemoglobin: 12.5 g/dL (ref 12.0–15.0)
MCH: 32 pg (ref 26.0–34.0)
MCHC: 33.2 g/dL (ref 30.0–36.0)
MCV: 96.2 fL (ref 78.0–100.0)
Platelets: 206 10*3/uL (ref 150–400)
RBC: 3.91 MIL/uL (ref 3.87–5.11)
RDW: 13.3 % (ref 11.5–15.5)
WBC: 8.3 10*3/uL (ref 4.0–10.5)

## 2015-12-14 LAB — CEA: CEA: 1.5 ng/mL (ref 0.0–4.7)

## 2015-12-14 LAB — TSH: TSH: 2.934 u[IU]/mL (ref 0.350–4.500)

## 2015-12-14 MED ORDER — AMLODIPINE BESYLATE 5 MG PO TABS
5.0000 mg | ORAL_TABLET | Freq: Every day | ORAL | Status: DC
  Filled 2015-12-14: qty 1

## 2015-12-14 NOTE — Progress Notes (Signed)
Linda [name] [id number] DOB: 10-Mar-1946 DOA: 12/11/2015 PCP: PROVIDER NOT IN SYSTEM Assessment at time of admission by Dr Alcario Drought on 12/11/15 Linda [name] is a 70 y.o. female with h/o kidney stones, patient presents to the ED with lower abdominal pain since 1430 yesterday. Has mucus and scant blood per rectum. Pain worsening since then, has nausea but no vomiting. No urinary symptoms. No CP, no SOB. No subjective fever or chills. Summary&Daily Progress Notes since admission 12/12/15: Patient found to have sigmoid diverticulitis by CT abdomen pelvis "1. Findings compatible with acute diverticulitis involving the sigmoid colon. No significant free air in no abscess identified. 2. Kidney stones 3. Indeterminate, intermediate attenuating structure arises from the inferior pole the left kidney. This may represent a complex cyst or enhancing renal lesion. Recommend followup imaging with nonemergent contrast enhanced MRI of the kidneys. 4. Aortic atherosclerosis and LAD coronary artery calcification". White count was 16,300 time of admission. She reports improvement although she still has abdominal pain. She is not interested in colonoscopy which I have recommended, to be considered once inflammation has subsided. She will also need follow-up for her abnormal kidney lesion. Will continue Ceftriaxone/Flagyl, check CEA and give D5 normal saline with KCl, also PPI. Hopefully DC home in the next 48-72 hours if she continues to do well. Continue clear liquid diet. 12/13/15: White count has improved to 10,900. However, patient still has abdominal pain which is better. Will continue current antibiotics to complete 2 weeks of antibiotics, follow CEA, obtain TSH/hemoglobin A1c and consider repeat CT abdomen and pelvis if pain persists. Will continue clear liquid diet. Patient will need GI follow-up outpatient for colonoscopy. She seems more open to it. 12/14/15: CEA/TSH normal. Abdominal pain better. White count  continues to improve. BP borderline high- patient would rather her PCP decide on what to do about the blood pressure. She understands hypertension is asymptomatic. Will advance patient's diet to a heart healthy, continue current antibiotics and plan discharge in the next 24-48 hours if patient continues to do well. Problem List Plan  Active Problems:   Diverticulitis   Kidney lesion   Advance diet to heart healthy  Continue Ceftriaxone/Flagyl   Code Status: Full Code Family Communication: sister at bedside Disposition Plan: Home in the next 48 to 72 hours if she continues to do well Consultants:  None Procedures:  None Antibiotics:  Ceftriaxone 12/11/2015>>  Flagyl 12/11/2015>>  HPI/Subjective: Feels better. Less abdominal pain. Ambulated with less discomfort.  Objective: Filed Vitals:   12/13/15 2300 12/14/15 0502  BP: 139/79 136/85  Pulse: 95 77  Temp: 98 F (36.7 C) 98.2 F (36.8 C)  Resp: 20 18   No intake or output data in the 24 hours ending 12/14/15 1403 Filed Weights   12/11/15 2359  Weight: 79.2 kg (174 lb 9.7 oz)    Exam:   General:  Comfortable at rest.  Cardiovascular: S1-S2 normal. No murmurs. Pulse regular.  Respiratory: Good air entry bilaterally. No rhonchi or rales.  Abdomen: Soft, mildly tender to palpation lower abdomen. Normal bowel sounds. No organomegaly.  Musculoskeletal: No pedal edema   Neurological: Intact  Data Reviewed: Basic Metabolic Panel:  Recent Labs Lab 12/11/15 1800 12/12/15 0220 12/13/15 0531 12/14/15 0557  NA 142 140 139 142  K 3.9 4.1 3.7 3.7  CL 106 107 106 112*  CO2 24 25 25 24   GLUCOSE 114* 131* 109* 109*  BUN 13 11 8 6   CREATININE 0.74 0.70 0.67 0.67  CALCIUM 10.3 9.0 8.7* 9.0  Liver Function Tests:  Recent Labs Lab 12/11/15 1800 12/13/15 0531  AST 32 18  ALT 55* 28  ALKPHOS 82 63  BILITOT 1.9* 0.7  PROT 7.6 6.0*  ALBUMIN 4.4 3.1*    Recent Labs Lab 12/11/15 1800  LIPASE 22   No  results for input(s): AMMONIA in the last 168 hours. CBC:  Recent Labs Lab 12/11/15 1800 12/12/15 0220 12/13/15 0531 12/14/15 0557  WBC 16.3* 13.9* 10.9* 8.3  NEUTROABS  --   --  7.6  --   HGB 16.2* 13.3 13.1 12.5  HCT 45.9 40.0 39.3 37.6  MCV 93.3 96.6 96.6 96.2  PLT 213 181 179 206   Cardiac Enzymes:  Recent Labs Lab 12/11/15 1740 12/12/15 0220 12/12/15 0808 12/12/15 1533  TROPONINI <0.03 <0.03 <0.03 <0.03   BNP (last 3 results) No results for input(s): BNP in the last 8760 hours.  ProBNP (last 3 results) No results for input(s): PROBNP in the last 8760 hours.  CBG: No results for input(s): GLUCAP in the last 168 hours.  Recent Results (from the past 240 hour(s))  Urine culture     Status: None   Collection Time: 12/11/15  7:51 PM  Result Value Ref Range Status   Specimen Description URINE, CLEAN CATCH  Final   Special Requests NONE  Final   Culture   Final    MULTIPLE SPECIES PRESENT, SUGGEST RECOLLECTION Performed at [facility]    Report Status 12/13/2015 FINAL  Final  Blood culture (routine x 2)     Status: None (Preliminary result)   Collection Time: 12/11/15  8:27 PM  Result Value Ref Range Status   Specimen Description BLOOD RIGHT [facility]  Final   Special Requests BOTTLES DRAWN AEROBIC AND ANAEROBIC 5CC  Final   Culture   Final    NO GROWTH 1 DAY Performed at [facility]    Report Status PENDING  Incomplete  Blood culture (routine x 2)     Status: None (Preliminary result)   Collection Time: 12/11/15  8:53 PM  Result Value Ref Range Status   Specimen Description BLOOD LEFT ANTECUBITAL  Final   Special Requests BOTTLES DRAWN AEROBIC AND ANAEROBIC 5 ML  Final   Culture   Final    NO GROWTH 2 DAYS Performed at [facility]    Report Status PENDING  Incomplete     Studies: No results found.  Scheduled Meds: . cefTRIAXone (ROCEPHIN)  IV  1 g Intravenous Q24H  . metronidazole  500 mg Intravenous Q8H  . pantoprazole  40 mg  Oral Q1200   Continuous Infusions: . dextrose 5 % and 0.9 % NaCl with KCl 20 mEq/L 75 mL/hr at 12/14/15 0200     Time spent: 215 minutes    Linda [name]  [facility] Pager [phone]. If 7PM-7AM, please contact night-coverage at [email or web address], password [facility] 12/14/2015, 2:03 PM

## 2015-12-15 DIAGNOSIS — K5733 Diverticulitis of large intestine without perforation or abscess with bleeding: Secondary | ICD-10-CM | POA: Diagnosis not present

## 2015-12-15 LAB — CBC
HCT: 39.1 % (ref 36.0–46.0)
Hemoglobin: 13.2 g/dL (ref 12.0–15.0)
MCH: 32.1 pg (ref 26.0–34.0)
MCHC: 33.8 g/dL (ref 30.0–36.0)
MCV: 95.1 fL (ref 78.0–100.0)
Platelets: 230 10*3/uL (ref 150–400)
RBC: 4.11 MIL/uL (ref 3.87–5.11)
RDW: 13.1 % (ref 11.5–15.5)
WBC: 7 10*3/uL (ref 4.0–10.5)

## 2015-12-15 LAB — BASIC METABOLIC PANEL
Anion gap: 9 (ref 5–15)
BUN: 5 mg/dL — ABNORMAL LOW (ref 6–20)
CO2: 23 mmol/L (ref 22–32)
Calcium: 9.4 mg/dL (ref 8.9–10.3)
Chloride: 110 mmol/L (ref 101–111)
Creatinine, Ser: 0.54 mg/dL (ref 0.44–1.00)
GFR calc Af Amer: >60 mL/min (ref >60)
GFR calc non Af Amer: >60 mL/min (ref >60)
Glucose, Bld: 98 mg/dL (ref 65–99)
Potassium: 3.6 mmol/L (ref 3.5–5.1)
Sodium: 142 mmol/L (ref 135–145)

## 2015-12-15 LAB — HEMOGLOBIN A1C
Hgb A1c MFr Bld: 5.3 % (ref 4.8–5.6)
Mean Plasma Glucose: 105 mg/dL

## 2015-12-15 MED ORDER — METRONIDAZOLE 500 MG PO TABS: 500.0000 mg | ORAL_TABLET | Freq: Three times a day (TID) | ORAL | Status: DC

## 2015-12-15 MED ORDER — CEFUROXIME AXETIL 250 MG PO TABS: 250.0000 mg | ORAL_TABLET | Freq: Two times a day (BID) | ORAL | Status: DC

## 2015-12-15 NOTE — Discharge Summary (Addendum)
Linda [name], is a 70 y.o. female  DOB [birth year]  MRN [id number].  Admission date:  12/11/2015  Admitting Physician  Etta Quill, DO  Discharge Date:  12/15/2015   Primary MD  PROVIDER NOT IN SYSTEM  Recommendations for primary care physician for things to follow:  Consider starting antihypertensives, refer to Gi for colonoscopy   Admission Diagnosis   Diverticulitis large intestine w/o perforation or abscess w/o bleeding [K57.32]   Discharge Diagnosis  Diverticulitis large intestine w/o perforation or abscess w/o bleeding [K57.32]   Active Problems:   Diverticulitis   Kidney lesion  Summary Linda [name] is a pleasant 70 year old female admitted with left lower quadrant pain caused by sigmoid diverticulitis without complications. She has responded to Ceftriaxone/Flagyl and will complete 2 weeks of antibiotics outpatient. She will follow with her regular provider to be set up for colonoscopy after completing course of antibiotics(Cefuroxime- PCN allergy but tolerated Ceftriaxone, and Flagyl). She had not been eager to have colonoscopy but she is more open to it now. I encouraged high-fiber diet. She also has elevated BP but she would rather work with her primary care provider to address this. She is discharged in stable condition. Please refer to the daily progress notes below for details of patient's hospital stay including studies;  Hospital Course  Assessment at time of admission by Dr Alcario Drought on 12/11/15 Linda [name] is a 70 y.o. female with h/o kidney stones, patient presents to the ED with lower abdominal pain since 1430 yesterday. Has mucus and scant blood per rectum. Pain worsening since then, has nausea but no vomiting. No urinary symptoms. No CP, no SOB. No subjective fever or  chills. Summary&Daily Progress Notes since admission 12/12/15: Patient found to have sigmoid diverticulitis by CT abdomen pelvis "1. Findings compatible with acute diverticulitis involving the sigmoid colon. No significant free air in no abscess identified. 2. Kidney stones 3. Indeterminate, intermediate attenuating structure arises from the inferior pole the left kidney. This may represent a complex cyst or enhancing renal lesion. Recommend followup imaging with nonemergent contrast enhanced MRI of the kidneys. 4. Aortic atherosclerosis and LAD coronary artery calcification". White count was 16,300 time of admission. She reports improvement although she still has abdominal pain. She is not interested in colonoscopy which I have recommended, to be considered once inflammation has subsided. She will also need follow-up for her abnormal kidney lesion. Will continue Ceftriaxone/Flagyl, check CEA and give D5 normal saline with KCl, also PPI. Hopefully DC home in the next 48-72 hours if she continues to do well. Continue clear liquid diet. 12/13/15: White count has improved to 10,900. However, patient still has abdominal pain which is better. Will continue current antibiotics to complete 2 weeks of antibiotics, follow CEA, obtain TSH/hemoglobin A1c and consider repeat CT abdomen and pelvis if pain persists. Will continue clear liquid diet. Patient will need GI follow-up outpatient for colonoscopy. She seems more open to it. 12/14/15: CEA/TSH normal. Abdominal pain better. White count continues to improve. BP borderline high-  patient would rather her PCP decide on what to do about the blood pressure. She understands hypertension is asymptomatic. Will advance patient's diet to a heart healthy, continue current antibiotics and plan discharge in the next 24-48 hours if patient continues to do well. 12/15/15: Feels better. Ready to go home. Will follow with her PCP to be set up for colonoscopy.  Discharge  Condition Stable.  Consults obtained  None  Follow UP     Discharge Instructions  and  Discharge Medications  Discharge Instructions    Diet - low sodium heart healthy    Complete by:  As directed   High fiber diet     Discharge instructions    Complete by:  As directed   Please follow with your PCP/GI in 1-2 weeks.     Increase activity slowly    Complete by:  As directed             Medication List    TAKE these medications        acetaminophen 500 MG tablet  Commonly known as:  TYLENOL  Take 250 mg by mouth every 6 (six) hours as needed for moderate pain.     cefUROXime 250 MG tablet  Commonly known as:  CEFTIN  Take 1 tablet (250 mg total) by mouth 2 (two) times daily with a meal.     metroNIDAZOLE 500 MG tablet  Commonly known as:  FLAGYL  Take 1 tablet (500 mg total) by mouth 3 (three) times daily.     ROBAXIN PO  Take 0.25 tablets by mouth daily as needed (muscle spasms).        Diet and Activity recommendation: See Discharge Instructions above  Major procedures and Radiology Reports - PLEASE review detailed and final reports for all details, in brief -    Ct Abdomen Pelvis W Contrast  12/11/2015  CLINICAL DATA:  Lower abdominal pain EXAM: CT ABDOMEN AND PELVIS WITH CONTRAST TECHNIQUE: Multidetector CT imaging of the abdomen and pelvis was performed using the standard protocol following bolus administration of intravenous contrast. CONTRAST:  50mL OMNIPAQUE IOHEXOL 300 MG/ML SOLN, 129mL OMNIPAQUE IOHEXOL 300 MG/ML SOLN COMPARISON:  10/28/2011 FINDINGS: Lower chest: No plural effusion identified. The lung bases are clear. Calcification within the thoracic aorta and LAD coronary artery noted. Hepatobiliary: No focal liver abnormality. The gallbladder is normal. No biliary dilatation. Pancreas: Normal appearance of the pancreas. Spleen: Negative Adrenals/Urinary Tract: The adrenal glands are negative. Bilateral nephrolithiasis identified. Within the upper pole  the left kidney the largest stone measures 5 mm. Left sided parapelvic cysts noted. There is a indeterminate lesion within the left kidney which measures 45 Hounsfield units and 11 mm, image 43 of series 2. The urinary bladder appears normal. Stomach/Bowel: Small hiatal hernia. The small bowel loops have a normal course and caliber. There is no obstruction. 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 Abnormal wall thickening and inflammation involves the sigmoid colon compatible with acute diverticulitis. No significant free air to suggest perforation. No abscess. Vascular/Lymphatic: Calcified atherosclerotic disease involves the abdominal aorta. No aneurysm. No enlarged retroperitoneal or mesenteric adenopathy. No enlarged pelvic or inguinal lymph nodes. Reproductive: Previous hysterectomy.  No adnexal mass. Other: No fluid collection identified. There is a small amount of free fluid within the pelvis. 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 There is an anterolisthesis of L4 on L5. IMPRESSION: 1. Findings compatible with acute diverticulitis involving the sigmoid colon. No significant free air in no abscess identified.  2. Kidney stones 3. Indeterminate, intermediate attenuating structure arises from the inferior pole the left kidney. This may represent a complex cyst or enhancing renal lesion. Recommend followup imaging with nonemergent contrast enhanced MRI of the kidneys. 4. Aortic atherosclerosis and LAD coronary artery calcification. Electronically Signed   By: Kerby Moors M.D.   On: 12/11/2015 20:20   Dg Abd Acute W/chest  12/11/2015  CLINICAL DATA:  Lower abdominal pain since yesterday. EXAM: DG ABDOMEN ACUTE W/ 1V CHEST COMPARISON:  Chest radiograph 07/13/2010 and abdominal CT 10/28/2011 FINDINGS: Left shoulder arthroplasty. Few densities at the left lung base may represent atelectasis. Otherwise, the lungs are clear. Heart size is normal.  Negative for free air.7 mm calcification overlying the left renal shadow could represent a renal calculus. Calcifications in the pelvis probably represent phleboliths. Nonobstructive bowel gas pattern with gas in the colon. Degenerative changes in lower lumbar spine. IMPRESSION: Probable left renal calculus. No acute chest abnormality. Electronically Signed   By: Markus Daft M.D.   On: 12/11/2015 18:28    Micro Results   Recent Results (from the past 240 hour(s))  Urine culture     Status: None   Collection Time: 12/11/15  7:51 PM  Result Value Ref Range Status   Specimen Description URINE, CLEAN CATCH  Final   Special Requests NONE  Final   Culture   Final    MULTIPLE SPECIES PRESENT, SUGGEST RECOLLECTION Performed at [facility]    Report Status 12/13/2015 FINAL  Final  Blood culture (routine x 2)     Status: None (Preliminary result)   Collection Time: 12/11/15  8:27 PM  Result Value Ref Range Status   Specimen Description BLOOD RIGHT AC  Final   Special Requests BOTTLES DRAWN AEROBIC AND ANAEROBIC 5CC  Final   Culture   Final    NO GROWTH 2 DAYS Performed at [facility]    Report Status PENDING  Incomplete  Blood culture (routine x 2)     Status: None (Preliminary result)   Collection Time: 12/11/15  8:53 PM  Result Value Ref Range Status   Specimen Description BLOOD LEFT ANTECUBITAL  Final   Special Requests BOTTLES DRAWN AEROBIC AND ANAEROBIC 5 ML  Final   Culture   Final    NO GROWTH 3 DAYS Performed at [facility]    Report Status PENDING  Incomplete       Today   Subjective:   Linda [name] today has no headache,no chest abdominal pain,no new weakness tingling or numbness, feels much better wants to go home today.   Objective:   Blood pressure 139/92, pulse 89, temperature 97.9 F (36.6 C), temperature source Oral, resp. rate 18, height 5\' 3"  (1.6 m), weight 79.2 kg (174 lb 9.7 oz), SpO2 98 %.   Intake/Output Summary (Last 24  hours) at 12/15/15 1303 Last data filed at 12/15/15 0900  Gross per 24 hour  Intake    240 ml  Output      0 ml  Net    240 ml    Exam Awake Alert, Oriented x 3, No new F.N deficits, Normal affect [place].AT,PERRAL Supple Neck,No JVD, No cervical lymphadenopathy appriciated.  Symmetrical Chest wall movement, Good air movement bilaterally, CTAB RRR,No Gallops,Rubs or new Murmurs, No Parasternal Heave +ve B.Sounds, Abd Soft, Non tender, No organomegaly appriciated, No rebound -guarding or rigidity. No Cyanosis, Clubbing or edema, No new Rash or bruise  Data Review   CBC w Diff: Lab Results  Component Value Date   WBC 7.0 12/15/2015   HGB 13.2 12/15/2015   HCT 39.1 12/15/2015   PLT 230 12/15/2015   LYMPHOPCT 19 12/13/2015   MONOPCT 9 12/13/2015   EOSPCT 2 12/13/2015   BASOPCT 0 12/13/2015    CMP: Lab Results  Component Value Date   NA 142 12/15/2015   K 3.6 12/15/2015   CL 110 12/15/2015   CO2 23 12/15/2015   BUN 5* 12/15/2015   CREATININE 0.54 12/15/2015   PROT 6.0* 12/13/2015   ALBUMIN 3.1* 12/13/2015   BILITOT 0.7 12/13/2015   ALKPHOS 63 12/13/2015   AST 18 12/13/2015   ALT 28 12/13/2015  .   Total Time in preparing paper work, data evaluation and todays exam - 25 minutes  Linda [name] M.D on 12/15/2015 at 1:03 PM  [facility] Office  [phone]

## 2015-12-15 NOTE — Progress Notes (Signed)
Patient discharged.  Leaving with two prescriptions - One at pharmacy, one in hand.  Accompanied by sister.  Room air.  No s/s of pain or distress.  A&O x4.  Leaving with personal belongings.  No complaints.

## 2015-12-16 LAB — CULTURE, BLOOD (ROUTINE X 2): Culture: NO GROWTH

## 2015-12-17 DIAGNOSIS — K5732 Diverticulitis of large intestine without perforation or abscess without bleeding: Secondary | ICD-10-CM | POA: Diagnosis not present

## 2015-12-17 DIAGNOSIS — E669 Obesity, unspecified: Secondary | ICD-10-CM | POA: Diagnosis not present

## 2015-12-17 DIAGNOSIS — Z1211 Encounter for screening for malignant neoplasm of colon: Secondary | ICD-10-CM | POA: Diagnosis not present

## 2015-12-17 LAB — CULTURE, BLOOD (ROUTINE X 2): Culture: NO GROWTH

## 2015-12-18 ENCOUNTER — Encounter: Payer: Self-pay | Admitting: Gastroenterology

## 2015-12-24 ENCOUNTER — Telehealth: Payer: Self-pay | Admitting: Gastroenterology

## 2015-12-24 NOTE — Telephone Encounter (Signed)
She was unsure on answering some of the questions on her form.

## 2016-01-09 DIAGNOSIS — Z1231 Encounter for screening mammogram for malignant neoplasm of breast: Secondary | ICD-10-CM | POA: Diagnosis not present

## 2016-02-01 ENCOUNTER — Ambulatory Visit (AMBULATORY_SURGERY_CENTER): Payer: Self-pay | Admitting: *Deleted

## 2016-02-01 ENCOUNTER — Telehealth: Payer: Self-pay | Admitting: Gastroenterology

## 2016-02-01 VITALS — Ht 62.0 in | Wt 170.0 lb

## 2016-02-01 DIAGNOSIS — Z1211 Encounter for screening for malignant neoplasm of colon: Secondary | ICD-10-CM

## 2016-02-01 MED ORDER — NA SULFATE-K SULFATE-MG SULF 17.5-3.13-1.6 GM/177ML PO SOLN: 1.0000 | Freq: Once | ORAL | Status: DC

## 2016-02-01 NOTE — Telephone Encounter (Signed)
Advised the patient to check with her ortho or the PCP.

## 2016-02-01 NOTE — Progress Notes (Signed)
No egg or soy allergy. Pt was told after knee surgery to watch her heart, but does not know why, has had two surgeries since and not had any issues since.  No home O2.  No diet meds.

## 2016-02-05 DIAGNOSIS — Z6831 Body mass index (BMI) 31.0-31.9, adult: Secondary | ICD-10-CM | POA: Diagnosis not present

## 2016-02-05 DIAGNOSIS — Z1389 Encounter for screening for other disorder: Secondary | ICD-10-CM | POA: Diagnosis not present

## 2016-02-05 DIAGNOSIS — J101 Influenza due to other identified influenza virus with other respiratory manifestations: Secondary | ICD-10-CM | POA: Diagnosis not present

## 2016-02-15 ENCOUNTER — Encounter: Payer: Self-pay | Admitting: Gastroenterology

## 2016-02-15 ENCOUNTER — Ambulatory Visit (AMBULATORY_SURGERY_CENTER): Payer: Medicare Other | Admitting: Gastroenterology

## 2016-02-15 VITALS — BP 136/64 | HR 78 | Temp 96.6°F | Resp 12 | Ht 63.0 in | Wt 174.0 lb

## 2016-02-15 DIAGNOSIS — Z1211 Encounter for screening for malignant neoplasm of colon: Secondary | ICD-10-CM | POA: Diagnosis present

## 2016-02-15 DIAGNOSIS — R112 Nausea with vomiting, unspecified: Secondary | ICD-10-CM

## 2016-02-15 MED ORDER — SODIUM CHLORIDE 0.9 % IV SOLN
4.0000 mg | Freq: Once | INTRAVENOUS | Status: AC
  Administered 2016-02-15: 4 mg via INTRAVENOUS

## 2016-02-15 MED ORDER — SODIUM CHLORIDE 0.9 % IV SOLN: 500.0000 mL | INTRAVENOUS | Status: DC

## 2016-02-15 NOTE — Op Note (Signed)
[place] Patient Name: Linda [name] Procedure Date: 02/15/2016 11:00 AM MRN: [id number] Endoscopist: Mauri Pole , MD Age: 70 Date of Birth: 1946-02-13 Gender: Female Procedure:                Colonoscopy Indications:              Screening for colorectal malignant neoplasm Medicines:                Monitored Anesthesia Care Procedure:                Pre-Anesthesia Assessment:                           - Prior to the procedure, a History and Physical                            was performed, and patient medications and                            allergies were reviewed. The patient's tolerance of                            previous anesthesia was also reviewed. The risks                            and benefits of the procedure and the sedation                            options and risks were discussed with the patient.                            All questions were answered, and informed consent                            was obtained. Prior Anticoagulants: The patient has                            taken no previous anticoagulant or antiplatelet                            agents. ASA Grade Assessment: II - A patient with                            mild systemic disease. After reviewing the risks                            and benefits, the patient was deemed in                            satisfactory condition to undergo the procedure.                           After obtaining informed consent, the colonoscope  was passed under direct vision. Throughout the                            procedure, the patient's blood pressure, pulse, and                            oxygen saturations were monitored continuously. The                            Model PCF-H190L [id number]) scope was introduced                            through the anus and advanced to the the cecum,                            identified by appendiceal orifice and ileocecal                             valve. The colonoscopy was technically difficult                            and complex due to restricted mobility of the                            colon. Successful completion of the procedure was                            aided by changing endoscopes. The patient tolerated                            the procedure well. The quality of the bowel                            preparation was adequate. The ileocecal valve,                            appendiceal orifice, and rectum were photographed. Scope In: 11:14:13 AM Scope Out: 11:34:02 AM Scope Withdrawal Time: 0 hours 15 minutes 52 seconds  Total Procedure Duration: 0 hours 19 minutes 49 seconds  Findings:                 The perianal and digital rectal examinations were                            normal.                           Multiple small and large-mouthed diverticula were                            found in the sigmoid colon. There was narrowing of                            the colon in association with the diverticular  opening. There was evidence of diverticular spasm.                            There was evidence of an impacted diverticulum.                            There was no evidence of diverticular bleeding.                           Non-bleeding internal hemorrhoids were found during                            retroflexion. The hemorrhoids were small.                           The exam was otherwise without abnormality. Complications:            No immediate complications. Estimated Blood Loss:     Estimated blood loss: none. Impression:               - Severe diverticulosis in the sigmoid colon. There                            was narrowing of the colon in association with the                            diverticular opening. There was evidence of                            diverticular spasm. There was evidence of an                            impacted  diverticulum. There was no evidence of                            diverticular bleeding.                           - Non-bleeding internal hemorrhoids.                           - The examination was otherwise normal.                           - No specimens collected. Recommendation:           - Patient has a contact number available for                            emergencies. The signs and symptoms of potential                            delayed complications were discussed with the                            patient. Return to normal activities tomorrow.  Written discharge instructions were provided to the                            patient.                           - Resume previous diet.                           - Continue present medications.                           - Repeat colonoscopy in 10 years for screening                            purposes.                           - Return to GI clinic PRN. Mauri Pole, MD 02/15/2016 11:38:23 AM This report has been signed electronically.

## 2016-02-15 NOTE — Progress Notes (Signed)
Report to PACU, RN, vss, BBS= Clear.  

## 2016-02-15 NOTE — Patient Instructions (Signed)
YOU HAD AN ENDOSCOPIC PROCEDURE TODAY AT [facility]:   Refer to the procedure report that was given to you for any specific questions about what was found during the examination.  If the procedure report does not answer your questions, please call your gastroenterologist to clarify.  If you requested that your care partner not be given the details of your procedure findings, then the procedure report has been included in a sealed envelope for you to review at your convenience later.  YOU SHOULD EXPECT: Some feelings of bloating in the abdomen. Passage of more gas than usual.  Walking can help get rid of the air that was put into your GI tract during the procedure and reduce the bloating. If you had a lower endoscopy (such as a colonoscopy or flexible sigmoidoscopy) you may notice spotting of blood in your stool or on the toilet paper. If you underwent a bowel prep for your procedure, you may not have a normal bowel movement for a few days.  Please Note:  You might notice some irritation and congestion in your nose or some drainage.  This is from the oxygen used during your procedure.  There is no need for concern and it should clear up in a day or so.  SYMPTOMS TO REPORT IMMEDIATELY:   Following lower endoscopy (colonoscopy or flexible sigmoidoscopy):  Excessive amounts of blood in the stool  Significant tenderness or worsening of abdominal pains  Swelling of the abdomen that is new, acute  Fever of 100F or higher   For urgent or emergent issues, a gastroenterologist can be reached at any hour by calling [phone].   DIET: Your first meal following the procedure should be a small meal and then it is ok to progress to your normal diet. Heavy or fried foods are harder to digest and may make you feel nauseous or bloated.  Likewise, meals heavy in dairy and vegetables can increase bloating.  Drink plenty of fluids but you should avoid alcoholic beverages for 24 hours.  Increase  the fiber in your diet.  Drink plenty of water.  ACTIVITY:  You should plan to take it easy for the rest of today and you should NOT DRIVE or use heavy machinery until tomorrow (because of the sedation medicines used during the test).    FOLLOW UP: Our staff will call the number listed on your records the next business day following your procedure to check on you and address any questions or concerns that you may have regarding the information given to you following your procedure. If we do not reach you, we will leave a message.  However, if you are feeling well and you are not experiencing any problems, there is no need to return our call.  We will assume that you have returned to your regular daily activities without incident.  If any biopsies were taken you will be contacted by phone or by letter within the next 1-3 weeks.  Please call us at [phone] if you have not heard about the biopsies in 3 weeks.    SIGNATURES/CONFIDENTIALITY: You and/or your care partner have signed paperwork which will be entered into your electronic medical record.  These signatures attest to the fact that that the information above on your After Visit Summary has been reviewed and is understood.  Full responsibility of the confidentiality of this discharge information lies with you and/or your care-partner.  Thank-you for choosing [place] for your healthcare needs today.

## 2016-02-18 ENCOUNTER — Telehealth: Payer: Self-pay | Admitting: *Deleted

## 2016-02-18 NOTE — Telephone Encounter (Signed)
  Follow up Call-  Call back number 02/15/2016  Post procedure Call Back phone  # [phone]  Permission to leave phone message Yes     Patient questions:  Do you have a fever, pain , or abdominal swelling? No. Pain Score  0 *  Have you tolerated food without any problems? Yes.    Have you been able to return to your normal activities? Yes.    Do you have any questions about your discharge instructions: Diet   No. Medications  No. Follow up visit  No.  Do you have questions or concerns about your Care? Yes.    Actions: * If pain score is 4 or above: No action needed, pain <4.

## 2016-04-22 DIAGNOSIS — H25813 Combined forms of age-related cataract, bilateral: Secondary | ICD-10-CM | POA: Diagnosis not present

## 2016-05-26 DIAGNOSIS — Z6831 Body mass index (BMI) 31.0-31.9, adult: Secondary | ICD-10-CM | POA: Diagnosis not present

## 2016-05-26 DIAGNOSIS — Z01419 Encounter for gynecological examination (general) (routine) without abnormal findings: Secondary | ICD-10-CM | POA: Diagnosis not present

## 2016-06-06 DIAGNOSIS — L821 Other seborrheic keratosis: Secondary | ICD-10-CM | POA: Diagnosis not present

## 2016-06-06 DIAGNOSIS — D225 Melanocytic nevi of trunk: Secondary | ICD-10-CM | POA: Diagnosis not present

## 2016-06-06 DIAGNOSIS — L814 Other melanin hyperpigmentation: Secondary | ICD-10-CM | POA: Diagnosis not present

## 2016-06-06 DIAGNOSIS — L570 Actinic keratosis: Secondary | ICD-10-CM | POA: Diagnosis not present

## 2016-06-06 DIAGNOSIS — D1801 Hemangioma of skin and subcutaneous tissue: Secondary | ICD-10-CM | POA: Diagnosis not present

## 2016-06-09 DIAGNOSIS — R5383 Other fatigue: Secondary | ICD-10-CM | POA: Diagnosis not present

## 2016-06-09 DIAGNOSIS — Z130 Encounter for screening for diseases of the blood and blood-forming organs and certain disorders involving the immune mechanism: Secondary | ICD-10-CM | POA: Diagnosis not present

## 2016-06-09 DIAGNOSIS — E785 Hyperlipidemia, unspecified: Secondary | ICD-10-CM | POA: Diagnosis not present

## 2016-06-09 DIAGNOSIS — R7309 Other abnormal glucose: Secondary | ICD-10-CM | POA: Diagnosis not present

## 2016-06-09 DIAGNOSIS — Z131 Encounter for screening for diabetes mellitus: Secondary | ICD-10-CM | POA: Diagnosis not present

## 2016-06-09 DIAGNOSIS — E559 Vitamin D deficiency, unspecified: Secondary | ICD-10-CM | POA: Diagnosis not present

## 2016-06-09 DIAGNOSIS — Z1329 Encounter for screening for other suspected endocrine disorder: Secondary | ICD-10-CM | POA: Diagnosis not present

## 2016-06-09 DIAGNOSIS — R945 Abnormal results of liver function studies: Secondary | ICD-10-CM | POA: Diagnosis not present

## 2016-07-27 ENCOUNTER — Encounter (HOSPITAL_COMMUNITY): Payer: Self-pay | Admitting: Emergency Medicine

## 2016-07-27 ENCOUNTER — Ambulatory Visit (HOSPITAL_COMMUNITY)
Admission: EM | Admit: 2016-07-27 | Discharge: 2016-07-27 | Disposition: A | Discharge status: Home / Self Care | Payer: Medicare Other | Attending: Family Medicine | Admitting: Family Medicine

## 2016-07-27 DIAGNOSIS — T1591XA Foreign body on external eye, part unspecified, right eye, initial encounter: Secondary | ICD-10-CM

## 2016-07-27 MED ORDER — FLUORESCEIN SODIUM 1 MG OP STRP
ORAL_STRIP | OPHTHALMIC | Status: AC
  Filled 2016-07-27: qty 1

## 2016-07-27 MED ORDER — TETRACAINE HCL 0.5 % OP SOLN
OPHTHALMIC | Status: AC
  Filled 2016-07-27: qty 2

## 2016-07-27 NOTE — ED Triage Notes (Signed)
The patient presented to the [facility] with a complaint of a problem with her right eye. The patient stated that she placed her contact in her eye and then could not locate it. She stated that she was not sure if it is in her eye or of its has fallen out. The patient denied any pain.

## 2016-07-27 NOTE — Discharge Instructions (Signed)
You may use another contact   Cool compress  Follow up with your eye doctor.

## 2016-07-27 NOTE — ED Provider Notes (Signed)
CSN: [id number]     Arrival date & time 07/27/16  1417 History   First MD Initiated Contact with Patient 07/27/16 1537     Chief Complaint  Patient presents with  . Eye Problem   (Consider location/radiation/quality/duration/timing/severity/associated sxs/prior Treatment) HPI 70 Y/O FEMALE UNABLE TO LOCATE CONTACT FROM HER RIGHT EYE THIS MORNING. CONCERNED THAT CONTACT IS STILL IN HER EYE. NO VISION CHANGES, NO SENSATION OF FB.  Past Medical History:  Diagnosis Date  . Diverticulitis   . Kidney stone   . Kidney stones    Past Surgical History:  Procedure Laterality Date  . ABDOMINAL HYSTERECTOMY    . CESAREAN SECTION    . HEMORRHOID SURGERY    . KNEE SURGERY     x3  . TOTAL SHOULDER REPLACEMENT     Family History  Problem Relation Age of Onset  . Nephrolithiasis Other   . Colon cancer Neg Hx   . Heart disease Father    Social History  Substance Use Topics  . Smoking status: Former Smoker    Years: 7.00  . Smokeless tobacco: Never Used  . Alcohol use No   OB History    No data available     Review of Systems  Denies: HEADACHE, NAUSEA, ABDOMINAL PAIN, CHEST PAIN, CONGESTION, DYSURIA, SHORTNESS OF BREATH  Allergies  Ciprofloxacin; Codeine; Naproxen; Nitrofurantoin monohyd macro; and Penicillins  Home Medications   Prior to Admission medications   Medication Sig Start Date End Date Taking? Authorizing Provider  Multiple Vitamins-Minerals (CENTRUM ADULTS PO) Take by mouth.    Historical Provider, MD   Meds Ordered and Administered this Visit  Medications - No data to display  BP 151/93 (BP Location: Right Arm)   Pulse 82   Temp 98 F (36.7 C) (Oral)   Resp 14   SpO2 96%  No data found.   Physical Exam NURSES NOTES AND VITAL SIGNS REVIEWED. CONSTITUTIONAL: Well developed, well nourished, no acute distress HEENT: normocephalic, atraumatic EYES: Conjunctiva normal, RIGHT EYE, EYE LID EVERTED AND SWEPT, NO FOREIGN BODY. NO ABRASION.  NECK:normal ROM,  supple, no adenopathy PULMONARY:No respiratory distress, normal effort ABDOMINAL: Soft, ND, NT BS+, No CVAT MUSCULOSKELETAL: Normal ROM of all extremities,  SKIN: warm and dry without rash PSYCHIATRIC: Mood and affect, behavior are normal  Urgent Care Course   Clinical Course    Procedures (including critical care time)  Labs Review Labs Reviewed - No data to display  Imaging Review No results found.   Visual Acuity Review  Right Eye Distance: 20/25 Left Eye Distance: 20/25 Bilateral Distance: 20/30  Right Eye Near:   Left Eye Near:    Bilateral Near:         MDM   1. Foreign body, eye, right, initial encounter     Patient is reassured that there are no issues that require transfer to higher level of care at this time or additional tests. Patient is advised to continue home symptomatic treatment. Patient is advised that if there are new or worsening symptoms to attend the emergency department, contact primary care provider, or return to UC. Instructions of care provided discharged home in stable condition.    THIS NOTE WAS GENERATED USING A VOICE RECOGNITION SOFTWARE PROGRAM. ALL REASONABLE EFFORTS  WERE MADE TO PROOFREAD THIS DOCUMENT FOR ACCURACY.  I have verbally reviewed the discharge instructions with the patient. A printed AVS was given to the patient.  All questions were answered prior to discharge.      Konrad Felix,  PA 07/27/16 2049

## 2016-09-10 HISTORY — PX: OTHER SURGICAL HISTORY: SHX169

## 2016-09-25 ENCOUNTER — Emergency Department (HOSPITAL_COMMUNITY): Payer: Medicare Other

## 2016-09-25 ENCOUNTER — Other Ambulatory Visit: Payer: Self-pay

## 2016-09-25 ENCOUNTER — Encounter (HOSPITAL_COMMUNITY): Payer: Self-pay

## 2016-09-25 ENCOUNTER — Emergency Department (HOSPITAL_COMMUNITY)
Admission: EM | Admit: 2016-09-25 | Discharge: 2016-09-25 | Disposition: A | Discharge status: Home / Self Care | Payer: Medicare Other | Attending: Internal Medicine | Admitting: Internal Medicine

## 2016-09-25 ENCOUNTER — Inpatient Hospital Stay (HOSPITAL_COMMUNITY): Payer: Medicare Other

## 2016-09-25 DIAGNOSIS — N23 Unspecified renal colic: Secondary | ICD-10-CM | POA: Diagnosis present

## 2016-09-25 DIAGNOSIS — K8020 Calculus of gallbladder without cholecystitis without obstruction: Secondary | ICD-10-CM

## 2016-09-25 DIAGNOSIS — N1330 Unspecified hydronephrosis: Secondary | ICD-10-CM | POA: Diagnosis not present

## 2016-09-25 DIAGNOSIS — I700 Atherosclerosis of aorta: Secondary | ICD-10-CM | POA: Insufficient documentation

## 2016-09-25 DIAGNOSIS — K5732 Diverticulitis of large intestine without perforation or abscess without bleeding: Secondary | ICD-10-CM | POA: Diagnosis not present

## 2016-09-25 DIAGNOSIS — N200 Calculus of kidney: Secondary | ICD-10-CM | POA: Diagnosis not present

## 2016-09-25 DIAGNOSIS — R109 Unspecified abdominal pain: Secondary | ICD-10-CM | POA: Diagnosis present

## 2016-09-25 DIAGNOSIS — K5730 Diverticulosis of large intestine without perforation or abscess without bleeding: Secondary | ICD-10-CM | POA: Diagnosis not present

## 2016-09-25 HISTORY — DX: Calculus of gallbladder without cholecystitis without obstruction: K80.20

## 2016-09-25 LAB — URINALYSIS, ROUTINE W REFLEX MICROSCOPIC
Bilirubin Urine: NEGATIVE
Glucose, UA: NEGATIVE mg/dL
Ketones, ur: NEGATIVE mg/dL
Nitrite: NEGATIVE
Protein, ur: NEGATIVE mg/dL
Specific Gravity, Urine: 1.018 (ref 1.005–1.030)
pH: 7.5 (ref 5.0–8.0)

## 2016-09-25 LAB — CBC WITH DIFFERENTIAL/PLATELET
Basophils Absolute: 0 10*3/uL (ref 0.0–0.1)
Basophils Relative: 0 %
Eosinophils Absolute: 0.2 10*3/uL (ref 0.0–0.7)
Eosinophils Relative: 2 %
HCT: 44.8 % (ref 36.0–46.0)
Hemoglobin: 15.6 g/dL — ABNORMAL HIGH (ref 12.0–15.0)
Lymphocytes Relative: 21 %
Lymphs Abs: 2.4 10*3/uL (ref 0.7–4.0)
MCH: 32.4 pg (ref 26.0–34.0)
MCHC: 34.8 g/dL (ref 30.0–36.0)
MCV: 93.1 fL (ref 78.0–100.0)
Monocytes Absolute: 0.8 10*3/uL (ref 0.1–1.0)
Monocytes Relative: 7 %
Neutro Abs: 8.2 10*3/uL — ABNORMAL HIGH (ref 1.7–7.7)
Neutrophils Relative %: 70 %
Platelets: 215 10*3/uL (ref 150–400)
RBC: 4.81 MIL/uL (ref 3.87–5.11)
RDW: 13.4 % (ref 11.5–15.5)
WBC: 11.6 10*3/uL — ABNORMAL HIGH (ref 4.0–10.5)

## 2016-09-25 LAB — HEPATIC FUNCTION PANEL
ALT: 55 U/L — ABNORMAL HIGH (ref 14–54)
AST: 46 U/L — ABNORMAL HIGH (ref 15–41)
Albumin: 4.2 g/dL (ref 3.5–5.0)
Alkaline Phosphatase: 83 U/L (ref 38–126)
Bilirubin, Direct: 0.1 mg/dL (ref 0.1–0.5)
Indirect Bilirubin: 0.8 mg/dL (ref 0.3–0.9)
Total Bilirubin: 0.9 mg/dL (ref 0.3–1.2)
Total Protein: 7.2 g/dL (ref 6.5–8.1)

## 2016-09-25 LAB — URINE MICROSCOPIC-ADD ON

## 2016-09-25 LAB — BASIC METABOLIC PANEL
Anion gap: 11 (ref 5–15)
BUN: 12 mg/dL (ref 6–20)
CO2: 22 mmol/L (ref 22–32)
Calcium: 10.2 mg/dL (ref 8.9–10.3)
Chloride: 104 mmol/L (ref 101–111)
Creatinine, Ser: 0.8 mg/dL (ref 0.44–1.00)
GFR calc Af Amer: >60 mL/min (ref >60)
GFR calc non Af Amer: >60 mL/min (ref >60)
Glucose, Bld: 117 mg/dL — ABNORMAL HIGH (ref 65–99)
Potassium: 3.9 mmol/L (ref 3.5–5.1)
Sodium: 137 mmol/L (ref 135–145)

## 2016-09-25 MED ORDER — TAMSULOSIN HCL 0.4 MG PO CAPS: 0.4000 mg | ORAL_CAPSULE | Freq: Every day | ORAL | 0 refills | Status: DC

## 2016-09-25 MED ORDER — METRONIDAZOLE IN NACL 5-0.79 MG/ML-% IV SOLN
500.0000 mg | Freq: Once | INTRAVENOUS | Status: AC
  Administered 2016-09-25: 500 mg via INTRAVENOUS
  Filled 2016-09-25: qty 100

## 2016-09-25 MED ORDER — PROMETHAZINE HCL 12.5 MG PO TABS: 25.0000 mg | ORAL_TABLET | Freq: Four times a day (QID) | ORAL | 0 refills | Status: DC | PRN

## 2016-09-25 MED ORDER — DEXTROSE 5 % IV SOLN
2.0000 g | Freq: Once | INTRAVENOUS | Status: AC
  Administered 2016-09-25: 2 g via INTRAVENOUS
  Filled 2016-09-25: qty 2

## 2016-09-25 MED ORDER — CEFUROXIME AXETIL 250 MG PO TABS: 250.0000 mg | ORAL_TABLET | Freq: Two times a day (BID) | ORAL | 0 refills | Status: DC

## 2016-09-25 MED ORDER — HYDROMORPHONE HCL 1 MG/ML IJ SOLN
0.5000 mg | Freq: Once | INTRAMUSCULAR | Status: AC
  Administered 2016-09-25: 0.5 mg via INTRAVENOUS
  Filled 2016-09-25: qty 1

## 2016-09-25 MED ORDER — OXYCODONE-ACETAMINOPHEN 5-325 MG PO TABS: 1.0000 | ORAL_TABLET | ORAL | 0 refills | Status: DC | PRN

## 2016-09-25 MED ORDER — METRONIDAZOLE 500 MG PO TABS: 500.0000 mg | ORAL_TABLET | Freq: Three times a day (TID) | ORAL | 0 refills | Status: DC

## 2016-09-25 MED ORDER — METOCLOPRAMIDE HCL 5 MG/ML IJ SOLN
5.0000 mg | Freq: Once | INTRAMUSCULAR | Status: AC
  Administered 2016-09-25: 5 mg via INTRAVENOUS
  Filled 2016-09-25: qty 2

## 2016-09-25 MED ORDER — ONDANSETRON HCL 4 MG/2ML IJ SOLN
4.0000 mg | Freq: Once | INTRAMUSCULAR | Status: AC
  Administered 2016-09-25: 4 mg via INTRAVENOUS
  Filled 2016-09-25: qty 2

## 2016-09-25 MED ORDER — PROMETHAZINE HCL 25 MG/ML IJ SOLN
12.5000 mg | Freq: Once | INTRAMUSCULAR | Status: AC
  Administered 2016-09-25: 12.5 mg via INTRAVENOUS
  Filled 2016-09-25: qty 1

## 2016-09-25 MED ORDER — MORPHINE SULFATE (PF) 4 MG/ML IV SOLN
4.0000 mg | Freq: Once | INTRAVENOUS | Status: AC
Start: 2016-09-25 — End: 2016-09-25
  Administered 2016-09-25: 4 mg via INTRAVENOUS
  Filled 2016-09-25: qty 1

## 2016-09-25 NOTE — ED Triage Notes (Signed)
Pt complains of left flank pain since midnight, hx of kidney stones and the pain is the same, pt also complains of vomiting

## 2016-09-25 NOTE — Consult Note (Addendum)
Consult Note:   Linda [name] [id number] DOB: September 16, 1946 DOA: 09/25/2016 PCP: Leonides Sake, MD  Requesting physician: Orpah Greek, MD   Reason for consultation: Evaluate for need for inpatient admission.    History of Present Illness:   Linda [name] is an 70 y.o. female with a PMH of diverticulitis, hospitalized back in February, extensive diverticulosis and nephrolithiasis who presented to the ED for evaluation of the sudden onset of severe left-sided flank pain which she described as similar to the pain she experienced when she has had kidney stones in the past. Pain was colicky in nature, sharp, and rated 10/10 in severity. Pain subsequently eased off after being given IV Dilaudid in the ED. She is now feeling nauseated. Urinalysis showed too numerous to count RBCs, 6-30 WBCs and many bacteria. CT of the abdomen showed an 11 mm calculus in the left renal pelvis suspected to be obstructive with mild left hydronephrosis and additional small bilateral nonobstructive nephrolithiasis as well as some inflammatory stranding around several sigmoid diverticula. At this time, the patient's pain has resolved and she has been seen by the urologist who feels that the stone is no longer obstructing and who plans to bring her back to his office early next week for definitive treatment and reevaluation. There has not been any associated fever or chills. Patient prefers to treat this as an outpatient.   Review of Systems:   Review of Systems  Constitutional: Negative for chills and fever.  HENT: Negative.   Eyes: Negative.   Respiratory: Negative.   Cardiovascular: Negative.   Gastrointestinal: Positive for nausea. Negative for abdominal pain, diarrhea and vomiting.  Genitourinary: Positive for flank pain. Negative for dysuria.  Skin: Negative.   Neurological: Negative.   Endo/Heme/Allergies: Negative.   Psychiatric/Behavioral: Negative.     Allergies:       Allergies  Allergen Reactions  . Ciprofloxacin Itching  . Codeine Nausea Only  . Naproxen Hives  . Nitrofurantoin Monohyd Macro Rash  . Penicillins Rash    Has patient had a PCN reaction causing immediate rash, facial/tongue/throat swelling, SOB or lightheadedness with hypotension: No Has patient had a PCN reaction causing severe rash involving mucus membranes or skin necrosis: Yes Has patient had a PCN reaction that required hospitalization No Has patient had a PCN reaction occurring within the last 10 years: No If all of the above answers are "NO", then may proceed with Cephalosporin use.     Past Medical History:     Past Medical History:  Diagnosis Date  . Atherosclerosis of abdominal aorta ([place]) 09/25/2016  . Cholelithiasis 09/25/2016  . Diverticulitis   . Kidney stone   . Kidney stones     Past Surgical History:   Past Surgical History:  Procedure Laterality Date  . ABDOMINAL HYSTERECTOMY    . CESAREAN SECTION    . HEMORRHOID SURGERY    . KNEE SURGERY     x3  . TOTAL SHOULDER REPLACEMENT      Current Medications:   Scheduled Meds: . promethazine  12.5 mg Intravenous Once   Continuous Infusions:  PRN Meds:.  Social History:    reports that she has quit smoking. She quit after 7.00 years of use. She has never used smokeless tobacco. She reports that she does not drink alcohol or use drugs.  Family History:   Family History  Problem Relation Age of Onset  . Nephrolithiasis Other   . Heart disease Father   . Colon cancer Neg  Hx      Physical Exam:    Vitals:   09/25/16 0500 09/25/16 0608 09/25/16 0630 09/25/16 0652  BP: 126/77 109/70 123/75 123/75  Pulse: 87 81 78 79  Resp:  18  18  Temp:      TempSrc:      SpO2: (!) 87% 92% (!) 85% 93%   General: Alert, awake, oriented x3, in no acute distress. HEENT: No bruits, no goiter. Heart: Regular rate and rhythm, without murmurs, rubs, gallops. Lungs: Clear to auscultation  bilaterally. Abdomen: Soft, nontender, nondistended, positive bowel sounds. Extremities: No clubbing cyanosis or edema with positive pedal pulses. Neuro: Grossly intact, nonfocal. Psych: Mood and affect normal.  Data Review:   Labs:  Basic Metabolic Panel:  Recent Labs Lab 09/25/16 0257  NA 137  K 3.9  CL 104  CO2 22  GLUCOSE 117*  BUN 12  CREATININE 0.80  CALCIUM 10.2   GFR CrCl cannot be calculated (Unknown ideal weight.). Liver Function Tests: No results for input(s): AST, ALT, ALKPHOS, BILITOT, PROT, ALBUMIN in the last 168 hours. No results for input(s): LIPASE, AMYLASE in the last 168 hours. No results for input(s): AMMONIA in the last 168 hours. Coagulation profile No results for input(s): INR, PROTIME in the last 168 hours.  CBC:  Recent Labs Lab 09/25/16 0257  WBC 11.6*  NEUTROABS 8.2*  HGB 15.6*  HCT 44.8  MCV 93.1  PLT 215   Cardiac Enzymes: No results for input(s): CKTOTAL, CKMB, CKMBINDEX, TROPONINI in the last 168 hours. BNP: Invalid input(s): POCBNP CBG: No results for input(s): GLUCAP in the last 168 hours. D-Dimer No results for input(s): DDIMER in the last 72 hours. Hgb A1c No results for input(s): HGBA1C in the last 72 hours. Lipid Profile No results for input(s): CHOL, HDL, LDLCALC, TRIG, CHOLHDL, LDLDIRECT in the last 72 hours. Thyroid function studies No results for input(s): TSH, T4TOTAL, T3FREE, THYROIDAB in the last 72 hours.  Invalid input(s): FREET3 Anemia work up No results for input(s): VITAMINB12, FOLATE, FERRITIN, TIBC, IRON, RETICCTPCT in the last 72 hours. Urinalysis    Component Value Date/Time   COLORURINE YELLOW 09/25/2016 0329   APPEARANCEUR TURBID (A) 09/25/2016 0329   LABSPEC 1.018 09/25/2016 0329   PHURINE 7.5 09/25/2016 0329   GLUCOSEU NEGATIVE 09/25/2016 0329   HGBUR LARGE (A) 09/25/2016 0329   BILIRUBINUR NEGATIVE 09/25/2016 0329   KETONESUR NEGATIVE 09/25/2016 0329   PROTEINUR NEGATIVE 09/25/2016 0329    UROBILINOGEN 0.2 12/01/2011 0913   NITRITE NEGATIVE 09/25/2016 0329   LEUKOCYTESUR MODERATE (A) 09/25/2016 0329     Sepsis Labs Invalid input(s): PROCALCITONIN,  WBC,  LACTICIDVEN Microbiology No results found for this or any previous visit (from the past 240 hour(s)).    Radiological Exams: Ct Renal Stone Study  Result Date: 09/25/2016 CLINICAL DATA:  Initial evaluation for acute left flank pain. EXAM: CT ABDOMEN AND PELVIS WITHOUT CONTRAST TECHNIQUE: Multidetector CT imaging of the abdomen and pelvis was performed following the standard protocol without IV contrast. COMPARISON:  [facility] CT from 12/11/2015. FINDINGS: Lower chest: Minimal subsegmental atelectasis seen dependently within the visualized lung bases. Visualized lungs are otherwise clear. Small fat containing Bochdalek's type hernia noted at the left lung base. Hepatobiliary: Visualized liver within normal limits. Subtle hyperdensity within the gallbladder suspicious for small stones. No imaging findings to suggest acute cholecystitis. No biliary dilatation. Pancreas: Pancreas within normal limits. Spleen: Spleen within normal limits. Adrenals/Urinary Tract: Adrenal glands are normal. On the left, there is an 11 mm stone  present within the left renal pelvis. This is suspected to be obstructive or at least partially obstructive, as there is mild hydronephrosis involving the upper pole of the left kidney (series 2, image 18). There are superimposed parapelvic cysts within the lower pole of the left kidney. Additional punctate nonobstructive calculus noted within the upper and lower poles of the left kidney. Distally, the left ureter is of normal caliber without hydroureter. No other radiopaque stones seen along the course of the left renal collecting system. Vague hypodensity noted within the lower pole of the left kidney, not well evaluated on this exam. On the right, there is a 4 mm nonobstructive stone within the upper pole the right  kidney. No other radiopaque calculi seen along the course of the right renal collecting system. No right-sided hydronephrosis or hydroureter. Stomach/Bowel: Small hiatal hernia noted. Stomach otherwise unremarkable. No evidence for bowel obstruction. Appendix normal. Sigmoid diverticulosis. Hazy inflammatory stranding about several diverticula suspicious for acute diverticulitis (series 2, image 56). No evidence for perforation or other complication. No other acute inflammatory changes about the bowels. Vascular/Lymphatic: Moderate aorto bi-iliac atherosclerotic disease. No aneurysm. No adenopathy. Reproductive: Uterus is absent.  Ovaries within normal limits. Other: No free air or fluid. Musculoskeletal: No acute osseous abnormality. No worrisome lytic or blastic osseous lesions. Grade 1 anterolisthesis of L4 on L5 and L5 and S1 with associated facet arthropathy noted. IMPRESSION: 1. 11 mm calculus within the left renal pelvis, suspected to be obstructive as there is mild left hydronephrosis. 2. Additional small bilateral nonobstructive nephrolithiasis as above. 3. Hazy inflammatory stranding about several sigmoid diverticula, compatible with concomitant acute sigmoid diverticulitis. No evidence for perforation or other complication. 4. Cholelithiasis. 5. Moderate aorto bi-iliac atherosclerotic disease. Electronically Signed   By: Jeannine Boga M.D.   On: 09/25/2016 05:59    Assessment and Plan:   Principal Problem:    Kidney stone Active Problems:    Diverticulosis of colon    Hydronephrosis of left kidney    Renal colic on left side  The patient has been seen and evaluated by urology with plans for close outpatient follow-up. She has had resolution of her pain. Suspect that the hazy inflammatory stranding about several sigmoid diverticula are either chronic in nature or residual from her prior episode of diverticulitis. She has no recurrent symptoms of abdominal pain to suggest an acute  diverticulitis. She has not had any fever or chills. Recommend discharge home on course of Ceftin/Flagyl, pain management and nausea management with close outpatient follow-up. I provided my pager # to the patient and if she develops worsening symptoms, I have instructed her to page me and we will directly admit her.   Thank you for this consultation.    Time Spent on Consult: 1 hour.  RAMA,CHRISTINA 09/25/2016, 7:53 AM

## 2016-09-25 NOTE — Consult Note (Signed)
Urology Consult   Physician requesting consult: Dr. Malachy Moan  Reason for consult: Kidney stone  History of Present Illness: Linda [name] is a 70 y.o. who developed severe, acute onset left flank pain with radiation to her left abdomen at midnight.  She had nausea and vomiting but denies fever.  She has had multiple prior stone episodes and has been able to pass them.  Her pain feels similar to her prior stone episodes.  She had a CT scan confirming a 10-11 mm left renal pelvic stone that is not currently in an obstructing position.  Radiographically, she had extensive diverticular disease and has a history of acute diverticulitis. This raised concern for concomitant diverticulitis.    Past Medical History:  Diagnosis Date  . Atherosclerosis of abdominal aorta ([place]) 09/25/2016  . Cholelithiasis 09/25/2016  . Diverticulitis   . Kidney stone   . Kidney stones     Past Surgical History:  Procedure Laterality Date  . ABDOMINAL HYSTERECTOMY    . CESAREAN SECTION    . HEMORRHOID SURGERY    . KNEE SURGERY     x3  . TOTAL SHOULDER REPLACEMENT       Current Hospital Medications:  Home meds:  Current Meds  Medication Sig  . Multiple Vitamins-Minerals (CENTRUM ADULTS PO) Take by mouth.     Scheduled Meds: . promethazine  12.5 mg Intravenous Once   Continuous Infusions: PRN Meds:.  Allergies:  Allergies  Allergen Reactions  . Ciprofloxacin Itching  . Codeine Nausea Only  . Naproxen Hives  . Nitrofurantoin Monohyd Macro Rash  . Penicillins Rash    Has patient had a PCN reaction causing immediate rash, facial/tongue/throat swelling, SOB or lightheadedness with hypotension: No Has patient had a PCN reaction causing severe rash involving mucus membranes or skin necrosis: Yes Has patient had a PCN reaction that required hospitalization No Has patient had a PCN reaction occurring within the last 10 years: No If all of the above answers are "NO", then may proceed with  Cephalosporin use.     Family History  Problem Relation Age of Onset  . Nephrolithiasis Other   . Heart disease Father   . Colon cancer Neg Hx     Social History:  reports that she has quit smoking. She quit after 7.00 years of use. She has never used smokeless tobacco. She reports that she does not drink alcohol or use drugs.  ROS: A complete review of systems was performed.  All systems are negative except for pertinent findings as noted.  Physical Exam:  Vital signs in last 24 hours: Temp:  [97.6 F (36.4 C)] 97.6 F (36.4 C) (11/16 0232) Pulse Rate:  [78-91] 79 (11/16 0652) Resp:  [18] 18 (11/16 0652) BP: (109-156)/(70-85) 123/75 (11/16 0652) SpO2:  [85 %-98 %] 93 % (11/16 [id number]) Constitutional:  Alert and oriented, No acute distress Cardiovascular: Regular rate and rhythm, No JVD Respiratory: Normal respiratory effort, Lungs clear bilaterally GI: Abdomen is soft, mild left abdominal tenderness, no CVAT. GU: No CVA tenderness Lymphatic: No lymphadenopathy Neurologic: Grossly intact, no focal deficits Psychiatric: Normal mood and affect  Laboratory Data:   Recent Labs  09/25/16 0257  WBC 11.6*  HGB 15.6*  HCT 44.8  PLT 215     Recent Labs  09/25/16 0257  NA 137  K 3.9  CL 104  GLUCOSE 117*  BUN 12  CALCIUM 10.2  CREATININE 0.80     Results for orders placed or performed during the hospital encounter of  09/25/16 (from the past 24 hour(s))  CBC with Differential/Platelet     Status: Abnormal   Collection Time: 09/25/16  2:57 AM  Result Value Ref Range   WBC 11.6 (H) 4.0 - 10.5 K/uL   RBC 4.81 3.87 - 5.11 MIL/uL   Hemoglobin 15.6 (H) 12.0 - 15.0 g/dL   HCT 44.8 36.0 - 46.0 %   MCV 93.1 78.0 - 100.0 fL   MCH 32.4 26.0 - 34.0 pg   MCHC 34.8 30.0 - 36.0 g/dL   RDW 13.4 11.5 - 15.5 %   Platelets 215 150 - 400 K/uL   Neutrophils Relative % 70 %   Neutro Abs 8.2 (H) 1.7 - 7.7 K/uL   Lymphocytes Relative 21 %   Lymphs Abs 2.4 0.7 - 4.0 K/uL    Monocytes Relative 7 %   Monocytes Absolute 0.8 0.1 - 1.0 K/uL   Eosinophils Relative 2 %   Eosinophils Absolute 0.2 0.0 - 0.7 K/uL   Basophils Relative 0 %   Basophils Absolute 0.0 0.0 - 0.1 K/uL  Basic metabolic panel     Status: Abnormal   Collection Time: 09/25/16  2:57 AM  Result Value Ref Range   Sodium 137 135 - 145 mmol/L   Potassium 3.9 3.5 - 5.1 mmol/L   Chloride 104 101 - 111 mmol/L   CO2 22 22 - 32 mmol/L   Glucose, Bld 117 (H) 65 - 99 mg/dL   BUN 12 6 - 20 mg/dL   Creatinine, Ser 0.80 0.44 - 1.00 mg/dL   Calcium 10.2 8.9 - 10.3 mg/dL   GFR calc non Af Amer >60 >60 mL/min   GFR calc Af Amer >60 >60 mL/min   Anion gap 11 5 - 15  Urinalysis, Routine w reflex microscopic (not at [facility])     Status: Abnormal   Collection Time: 09/25/16  3:29 AM  Result Value Ref Range   Color, Urine YELLOW YELLOW   APPearance TURBID (A) CLEAR   Specific Gravity, Urine 1.018 1.005 - 1.030   pH 7.5 5.0 - 8.0   Glucose, UA NEGATIVE NEGATIVE mg/dL   Hgb urine dipstick LARGE (A) NEGATIVE   Bilirubin Urine NEGATIVE NEGATIVE   Ketones, ur NEGATIVE NEGATIVE mg/dL   Protein, ur NEGATIVE NEGATIVE mg/dL   Nitrite NEGATIVE NEGATIVE   Leukocytes, UA MODERATE (A) NEGATIVE  Urine microscopic-add on     Status: Abnormal   Collection Time: 09/25/16  3:29 AM  Result Value Ref Range   Squamous Epithelial / LPF 0-5 (A) NONE SEEN   WBC, UA 6-30 0 - 5 WBC/hpf   RBC / HPF TOO NUMEROUS TO COUNT 0 - 5 RBC/hpf   Bacteria, UA MANY (A) NONE SEEN   Urine-Other AMORPHOUS URATES/PHOSPHATES    No results found for this or any previous visit (from the past 240 hour(s)).  Renal Function:  Recent Labs  09/25/16 0257  CREATININE 0.80   CrCl cannot be calculated (Unknown ideal weight.).  Radiologic Imaging: Ct Renal Stone Study  Result Date: 09/25/2016 CLINICAL DATA:  Initial evaluation for acute left flank pain. EXAM: CT ABDOMEN AND PELVIS WITHOUT CONTRAST TECHNIQUE: Multidetector CT imaging of the  abdomen and pelvis was performed following the standard protocol without IV contrast. COMPARISON:  [facility] CT from 12/11/2015. FINDINGS: Lower chest: Minimal subsegmental atelectasis seen dependently within the visualized lung bases. Visualized lungs are otherwise clear. Small fat containing Bochdalek's type hernia noted at the left lung base. Hepatobiliary: Visualized liver within normal limits. Subtle hyperdensity within the  gallbladder suspicious for small stones. No imaging findings to suggest acute cholecystitis. No biliary dilatation. Pancreas: Pancreas within normal limits. Spleen: Spleen within normal limits. Adrenals/Urinary Tract: Adrenal glands are normal. On the left, there is an 11 mm stone present within the left renal pelvis. This is suspected to be obstructive or at least partially obstructive, as there is mild hydronephrosis involving the upper pole of the left kidney (series 2, image 18). There are superimposed parapelvic cysts within the lower pole of the left kidney. Additional punctate nonobstructive calculus noted within the upper and lower poles of the left kidney. Distally, the left ureter is of normal caliber without hydroureter. No other radiopaque stones seen along the course of the left renal collecting system. Vague hypodensity noted within the lower pole of the left kidney, not well evaluated on this exam. On the right, there is a 4 mm nonobstructive stone within the upper pole the right kidney. No other radiopaque calculi seen along the course of the right renal collecting system. No right-sided hydronephrosis or hydroureter. Stomach/Bowel: Small hiatal hernia noted. Stomach otherwise unremarkable. No evidence for bowel obstruction. Appendix normal. Sigmoid diverticulosis. Hazy inflammatory stranding about several diverticula suspicious for acute diverticulitis (series 2, image 56). No evidence for perforation or other complication. No other acute inflammatory changes about the bowels.  Vascular/Lymphatic: Moderate aorto bi-iliac atherosclerotic disease. No aneurysm. No adenopathy. Reproductive: Uterus is absent.  Ovaries within normal limits. Other: No free air or fluid. Musculoskeletal: No acute osseous abnormality. No worrisome lytic or blastic osseous lesions. Grade 1 anterolisthesis of L4 on L5 and L5 and S1 with associated facet arthropathy noted. IMPRESSION: 1. 11 mm calculus within the left renal pelvis, suspected to be obstructive as there is mild left hydronephrosis. 2. Additional small bilateral nonobstructive nephrolithiasis as above. 3. Hazy inflammatory stranding about several sigmoid diverticula, compatible with concomitant acute sigmoid diverticulitis. No evidence for perforation or other complication. 4. Cholelithiasis. 5. Moderate aorto bi-iliac atherosclerotic disease. Electronically Signed   By: Jeannine Boga M.D.   On: 09/25/2016 05:59    I independently reviewed the above imaging studies.  Impression/Recommendation: Left renal calculus:  Will check KUB.  She needs her UTI treated prior to any intervention.  In absence of fever or actual ureteral obstruction, no indication for stent placement.  Her pain is controlled.  Will treat UTI with Ceftin after discussion with Dr. Rockne Menghini.  Culture sent.  Will arrange follow up to check repeat UA and if infection cleared will proceed with treatment of her stone.  She knows to call sooner if she develops fever, uncontrolled pain, or persistent vomiting.  We discussed options for treatment of her stone and reviewed the risk and benefits of ESWL vs ureteroscopic treatment.  She is interested in ESWL if her stone is visible on KUB.  This will be arranged pending her f/u UA in the office next week.  Harland Aguiniga,LES 09/25/2016, 7:58 AM  Pryor Curia. MD   CC: Dr. Betsey Holiday Dr. Rockne Menghini

## 2016-09-25 NOTE — ED Provider Notes (Addendum)
[facility] DEPT Provider Note   CSN: [id number] Arrival date & time: 09/25/16  0226 By signing my name below, I, Doran Stabler, attest that this documentation has been prepared under the direction and in the presence of Orpah Greek, MD. Electronically Signed: Doran Stabler, ED Scribe. 09/25/16. 2:50 AM.  History   Chief Complaint Chief Complaint  Patient presents with  . Flank Pain   The history is provided by the patient. No language interpreter was used.   HPI Comments: Linda [name] is a 70 y.o. female who presents to the Emergency Department with a PMHx of diverticulitis and kidney stones complaining of constant sharp left sided flank pain that began 3 hours ago. Pt also reports associated nausea and vomiting. Pt states her pain began in her lower left back which than radiated to her left abdomen. Pt denies any aggravating or alleviating factors. Pt has not taken any medications for her symptoms. Pt denies any falls or injuries. Pt denies any fevers, chills, CP, SOB, diarrhea, difficulty urinating, dysuria, hematuria, vaginal bleeding or any other symptoms at this time.  Past Medical History:  Diagnosis Date  . Diverticulitis   . Kidney stone   . Kidney stones    Patient Active Problem List   Diagnosis Date Noted  . Kidney lesion 12/12/2015  . Diverticulitis 12/11/2015   Past Surgical History:  Procedure Laterality Date  . ABDOMINAL HYSTERECTOMY    . CESAREAN SECTION    . HEMORRHOID SURGERY    . KNEE SURGERY     x3  . TOTAL SHOULDER REPLACEMENT     OB History    No data available     Home Medications    Prior to Admission medications   Medication Sig Start Date End Date Taking? Authorizing Provider  Multiple Vitamins-Minerals (CENTRUM ADULTS PO) Take by mouth.   Yes Historical Provider, MD   Family History Family History  Problem Relation Age of Onset  . Nephrolithiasis Other   . Heart disease Father   . Colon cancer Neg Hx    Social  History Social History  Substance Use Topics  . Smoking status: Former Smoker    Years: 7.00  . Smokeless tobacco: Never Used  . Alcohol use No   Allergies   Ciprofloxacin; Codeine; Naproxen; Nitrofurantoin monohyd macro; and Penicillins  Review of Systems Review of Systems  Constitutional: Negative for chills and fever.  Respiratory: Negative for shortness of breath.   Cardiovascular: Negative for chest pain.  Gastrointestinal: Positive for nausea and vomiting. Negative for diarrhea.  Genitourinary: Positive for flank pain. Negative for difficulty urinating, dysuria, hematuria and vaginal bleeding.  All other systems reviewed and are negative.  Physical Exam Updated Vital Signs BP 109/70   Pulse 81   Temp 97.6 F (36.4 C) (Oral)   Resp 18   SpO2 92%   Physical Exam  Constitutional: She is oriented to person, place, and time. She appears well-developed and well-nourished. She appears distressed.  HENT:  Head: Normocephalic and atraumatic.  Right Ear: Hearing normal.  Left Ear: Hearing normal.  Nose: Nose normal.  Mouth/Throat: Oropharynx is clear and moist and mucous membranes are normal.  Eyes: Conjunctivae and EOM are normal. Pupils are equal, round, and reactive to light.  Neck: Normal range of motion. Neck supple.  Cardiovascular: Regular rhythm, S1 normal and S2 normal.  Exam reveals no gallop and no friction rub.   No murmur heard. Pulmonary/Chest: Effort normal and breath sounds normal. No respiratory distress. She exhibits no tenderness.  Abdominal: Soft. Normal appearance and bowel sounds are normal. There is no hepatosplenomegaly. There is no tenderness. There is no rebound, no guarding, no tenderness at McBurney's point and negative Murphy's sign. No hernia.  Musculoskeletal: Normal range of motion.  Neurological: She is alert and oriented to person, place, and time. She has normal strength. No cranial nerve deficit or sensory deficit. Coordination normal. GCS  eye subscore is 4. GCS verbal subscore is 5. GCS motor subscore is 6.  Skin: Skin is warm, dry and intact. No rash noted. No cyanosis.  Psychiatric: She has a normal mood and affect. Her speech is normal and behavior is normal. Thought content normal.  Nursing note and vitals reviewed.  ED Treatments / Results  DIAGNOSTIC STUDIES: Oxygen Saturation is 98% on room air, normal by my interpretation.    COORDINATION OF CARE: 2:54 AM Discussed treatment plan with pt at bedside and pt agreed to plan.  Labs (all labs ordered are listed, but only abnormal results are displayed) Labs Reviewed  CBC WITH DIFFERENTIAL/PLATELET - Abnormal; Notable for the following:       Result Value   WBC 11.6 (*)    Hemoglobin 15.6 (*)    Neutro Abs 8.2 (*)    All other components within normal limits  BASIC METABOLIC PANEL - Abnormal; Notable for the following:    Glucose, Bld 117 (*)    All other components within normal limits  URINALYSIS, ROUTINE W REFLEX MICROSCOPIC (NOT AT [facility]) - Abnormal; Notable for the following:    APPearance TURBID (*)    Hgb urine dipstick LARGE (*)    Leukocytes, UA MODERATE (*)    All other components within normal limits  URINE MICROSCOPIC-ADD ON - Abnormal; Notable for the following:    Squamous Epithelial / LPF 0-5 (*)    Bacteria, UA MANY (*)    All other components within normal limits    EKG  EKG Interpretation None       Radiology Ct Renal Stone Study  Result Date: 09/25/2016 CLINICAL DATA:  Initial evaluation for acute left flank pain. EXAM: CT ABDOMEN AND PELVIS WITHOUT CONTRAST TECHNIQUE: Multidetector CT imaging of the abdomen and pelvis was performed following the standard protocol without IV contrast. COMPARISON:  [facility] CT from 12/11/2015. FINDINGS: Lower chest: Minimal subsegmental atelectasis seen dependently within the visualized lung bases. Visualized lungs are otherwise clear. Small fat containing Bochdalek's type hernia noted at the left lung  base. Hepatobiliary: Visualized liver within normal limits. Subtle hyperdensity within the gallbladder suspicious for small stones. No imaging findings to suggest acute cholecystitis. No biliary dilatation. Pancreas: Pancreas within normal limits. Spleen: Spleen within normal limits. Adrenals/Urinary Tract: Adrenal glands are normal. On the left, there is an 11 mm stone present within the left renal pelvis. This is suspected to be obstructive or at least partially obstructive, as there is mild hydronephrosis involving the upper pole of the left kidney (series 2, image 18). There are superimposed parapelvic cysts within the lower pole of the left kidney. Additional punctate nonobstructive calculus noted within the upper and lower poles of the left kidney. Distally, the left ureter is of normal caliber without hydroureter. No other radiopaque stones seen along the course of the left renal collecting system. Vague hypodensity noted within the lower pole of the left kidney, not well evaluated on this exam. On the right, there is a 4 mm nonobstructive stone within the upper pole the right kidney. No other radiopaque calculi seen along the course of the  right renal collecting system. No right-sided hydronephrosis or hydroureter. Stomach/Bowel: Small hiatal hernia noted. Stomach otherwise unremarkable. No evidence for bowel obstruction. Appendix normal. Sigmoid diverticulosis. Hazy inflammatory stranding about several diverticula suspicious for acute diverticulitis (series 2, image 56). No evidence for perforation or other complication. No other acute inflammatory changes about the bowels. Vascular/Lymphatic: Moderate aorto bi-iliac atherosclerotic disease. No aneurysm. No adenopathy. Reproductive: Uterus is absent.  Ovaries within normal limits. Other: No free air or fluid. Musculoskeletal: No acute osseous abnormality. No worrisome lytic or blastic osseous lesions. Grade 1 anterolisthesis of L4 on L5 and L5 and S1 with  associated facet arthropathy noted. IMPRESSION: 1. 11 mm calculus within the left renal pelvis, suspected to be obstructive as there is mild left hydronephrosis. 2. Additional small bilateral nonobstructive nephrolithiasis as above. 3. Hazy inflammatory stranding about several sigmoid diverticula, compatible with concomitant acute sigmoid diverticulitis. No evidence for perforation or other complication. 4. Cholelithiasis. 5. Moderate aorto bi-iliac atherosclerotic disease. Electronically Signed   By: Jeannine Boga M.D.   On: 09/25/2016 05:59    Procedures Procedures (including critical care time)  Medications Ordered in ED Medications  morphine 4 MG/ML injection 4 mg (4 mg Intravenous Given 09/25/16 0259)  ondansetron (ZOFRAN) injection 4 mg (4 mg Intravenous Given 09/25/16 0258)  HYDROmorphone (DILAUDID) injection 0.5 mg (0.5 mg Intravenous Given 09/25/16 0437)  metoCLOPramide (REGLAN) injection 5 mg (5 mg Intravenous Given 09/25/16 0531)     Initial Impression / Assessment and Plan / ED Course  I have reviewed the triage vital signs and the nursing notes.  Pertinent labs & imaging results that were available during my care of the patient were reviewed by me and considered in my medical decision making (see chart for details).  Clinical Course    Patient presents to the emergency department for evaluation of left flank and left abdominal pain. Patient reports a history of kidney stones. She does report that this pain seems similar. She is experiencing constant and severe pain. Patient had too numerous to count red blood cells in her urinalysis with some white blood cells present. Negative nitrite. This is consistent with kidney stone. Doubt infection, will obtain culture. Patient continued to have pain despite analgesia. She therefore underwent CT scan to further evaluate. This shows an obstructive 11 mm proximal renal pelvis stone causing hydronephrosis. She also, however, has  concomitant diverticulitis.  Patient will require hospitalization for pain management and urology consultation. She will also require antibiotic coverage for diverticulitis.Cannot rule out urinary tract infection. Urine culture pending. Rocephin plus Flagyl should cover diverticulitis as well as urinary infection.  Discussed with Dr. Alinda Money, on call for urology. We'll see the patient this morning and determine if stent is necessary.  Final Clinical Impressions(s) / ED Diagnoses   Final diagnoses:  Renal colic on left side  Diverticulitis of large intestine without perforation or abscess without bleeding    New Prescriptions New Prescriptions   No medications on file   I personally performed the services described in this documentation, which was scribed in my presence. The recorded information has been reviewed and is accurate.    Orpah Greek, MD 09/25/16 [place], MD 09/25/16 [phone]

## 2016-09-26 LAB — URINE CULTURE: Culture: NO GROWTH

## 2016-09-30 ENCOUNTER — Other Ambulatory Visit: Payer: Self-pay | Admitting: Urology

## 2016-10-07 ENCOUNTER — Other Ambulatory Visit: Payer: Self-pay | Admitting: Urology

## 2016-10-08 ENCOUNTER — Encounter (HOSPITAL_COMMUNITY): Payer: Self-pay | Admitting: *Deleted

## 2016-10-13 ENCOUNTER — Encounter (HOSPITAL_COMMUNITY)
Admission: RE | Disposition: A | Discharge status: Home / Self Care | Payer: Self-pay | Source: Ambulatory Visit | Attending: Urology

## 2016-10-13 ENCOUNTER — Encounter (HOSPITAL_COMMUNITY): Payer: Self-pay | Admitting: General Practice

## 2016-10-13 ENCOUNTER — Ambulatory Visit (HOSPITAL_COMMUNITY): Payer: Medicare Other

## 2016-10-13 ENCOUNTER — Ambulatory Visit (HOSPITAL_COMMUNITY)
Admission: RE | Admit: 2016-10-13 | Discharge: 2016-10-13 | Disposition: A | Discharge status: Home / Self Care | Payer: Medicare Other | Source: Ambulatory Visit | Attending: Urology | Admitting: Urology

## 2016-10-13 DIAGNOSIS — Z79899 Other long term (current) drug therapy: Secondary | ICD-10-CM | POA: Diagnosis not present

## 2016-10-13 DIAGNOSIS — Z87891 Personal history of nicotine dependence: Secondary | ICD-10-CM | POA: Insufficient documentation

## 2016-10-13 DIAGNOSIS — Z880 Allergy status to penicillin: Secondary | ICD-10-CM | POA: Insufficient documentation

## 2016-10-13 DIAGNOSIS — N200 Calculus of kidney: Secondary | ICD-10-CM | POA: Diagnosis present

## 2016-10-13 HISTORY — DX: Adverse effect of unspecified anesthetic, initial encounter: T41.45XA

## 2016-10-13 HISTORY — DX: Other complications of anesthesia, initial encounter: T88.59XA

## 2016-10-13 SURGERY — LITHOTRIPSY, ESWL
Anesthesia: LOCAL | Laterality: Left

## 2016-10-13 MED ORDER — DIAZEPAM 5 MG PO TABS
10.0000 mg | ORAL_TABLET | ORAL | Status: AC
  Administered 2016-10-13: 10 mg via ORAL
  Filled 2016-10-13: qty 2

## 2016-10-13 MED ORDER — SODIUM CHLORIDE 0.9 % IV SOLN
INTRAVENOUS | Status: DC
  Administered 2016-10-13: 08:00:00 via INTRAVENOUS

## 2016-10-13 MED ORDER — SULFAMETHOXAZOLE-TRIMETHOPRIM 800-160 MG PO TABS
1.0000 | ORAL_TABLET | Freq: Once | ORAL | Status: AC
  Administered 2016-10-13: 1 via ORAL
  Filled 2016-10-13: qty 1

## 2016-10-13 MED ORDER — DIPHENHYDRAMINE HCL 25 MG PO CAPS
25.0000 mg | ORAL_CAPSULE | ORAL | Status: AC
  Administered 2016-10-13: 25 mg via ORAL
  Filled 2016-10-13: qty 1

## 2016-10-13 NOTE — Discharge Instructions (Signed)
1 - You may have urinary urgency (bladder spasms), bloody urine on / off, and pass small stone fragments x few days. This is normal.  2 - Call MD or go to ER for fever >102, severe pain / nausea / vomiting not relieved by medications, or acute change in medical status

## 2016-10-13 NOTE — Brief Op Note (Signed)
10/13/2016  9:48 AM  PATIENT:  Linda [name]  70 y.o. female  PRE-OPERATIVE DIAGNOSIS:  LEFT RENAL CALCULUS  POST-OPERATIVE DIAGNOSIS:  * No post-op diagnosis entered *  PROCEDURE:  Procedure(s): LEFT EXTRACORPOREAL SHOCK WAVE LITHOTRIPSY (ESWL) (Left)  SURGEON:  Surgeon(s) and Role:    * Alexis Frock, MD - Primary  PHYSICIAN ASSISTANT:   ASSISTANTS: none   ANESTHESIA:   MAC  EBL:  No intake/output data recorded.  BLOOD ADMINISTERED:none  DRAINS: none   LOCAL MEDICATIONS USED:  NONE  SPECIMEN:  No Specimen  DISPOSITION OF SPECIMEN:  N/A  COUNTS:  YES  TOURNIQUET:  * No tourniquets in log *  DICTATION: .Note written in paper chart  PLAN OF CARE: Discharge to home after PACU  PATIENT DISPOSITION:  Short Stay   Delay start of Pharmacological VTE agent (>24hrs) due to surgical blood loss or risk of bleeding: not applicable

## 2016-10-13 NOTE — H&P (Signed)
Linda [name] is an 70 y.o. female.    Chief Complaint: Pre-op LEFT Shockwave Lithotripsy  HPI:   1 - LEFT Renal Stone - 44mm left renal stone with suspect some ball-valving intermitant obstruction by CT on eval colickly left flank pain. Stone is 25mm, 1070HU, SSD 10cm. Only additional is right renal non-obsructing. Most recent UCX negative. Stone seen on scout images and KUB today.     Past Medical History:  Diagnosis Date  . Atherosclerosis of abdominal aorta ([place]) 09/25/2016  . Cholelithiasis 09/25/2016  . Complication of anesthesia    was told by anesthesia in 1970's to watch her heart during surgery  . Diverticulitis   . Kidney stone   . Kidney stones     Past Surgical History:  Procedure Laterality Date  . ABDOMINAL HYSTERECTOMY    . CESAREAN SECTION    . HEMORRHOID SURGERY    . KNEE SURGERY     x3  . TOTAL SHOULDER REPLACEMENT      Family History  Problem Relation Age of Onset  . Nephrolithiasis Other   . Heart disease Father   . Colon cancer Neg Hx    Social History:  reports that she has quit smoking. She quit after 7.00 years of use. She has never used smokeless tobacco. She reports that she does not drink alcohol or use drugs.  Allergies:  Allergies  Allergen Reactions  . Ciprofloxacin Itching  . Codeine Nausea Only  . Naproxen Hives  . Nitrofurantoin Monohyd Macro Rash  . Penicillins Rash    Has patient had a PCN reaction causing immediate rash, facial/tongue/throat swelling, SOB or lightheadedness with hypotension: No Has patient had a PCN reaction causing severe rash involving mucus membranes or skin necrosis: Yes Has patient had a PCN reaction that required hospitalization No Has patient had a PCN reaction occurring within the last 10 years: No If all of the above answers are "NO", then may proceed with Cephalosporin use.     Medications Prior to Admission  Medication Sig Dispense Refill  . cefUROXime (CEFTIN) 250 MG tablet Take 1 tablet (250  mg total) by mouth 2 (two) times daily with a meal. 14 tablet 0  . metroNIDAZOLE (FLAGYL) 500 MG tablet Take 1 tablet (500 mg total) by mouth 3 (three) times daily. 21 tablet 0  . Multiple Vitamins-Minerals (CENTRUM ADULTS PO) Take by mouth.    . oxyCODONE-acetaminophen (PERCOCET/ROXICET) 5-325 MG tablet Take 1-2 tablets by mouth every 4 (four) hours as needed for severe pain. 15 tablet 0  . promethazine (PHENERGAN) 12.5 MG tablet Take 2 tablets (25 mg total) by mouth every 6 (six) hours as needed for nausea or vomiting. 15 tablet 0  . tamsulosin (FLOMAX) 0.4 MG CAPS capsule Take 1 capsule (0.4 mg total) by mouth daily. 7 capsule 0    No results found for this or any previous visit (from the past 48 hour(s)). No results found.  Review of Systems  Constitutional: Negative.  Negative for chills and fever.  HENT: Negative.   Eyes: Negative.   Respiratory: Negative.   Cardiovascular: Negative.   Gastrointestinal: Negative.   Genitourinary: Positive for flank pain.  Skin: Negative.   Neurological: Negative.   Endo/Heme/Allergies: Negative.   Psychiatric/Behavioral: Negative.     There were no vitals taken for this visit. Physical Exam  Constitutional: She appears well-developed.  HENT:  Head: Normocephalic.  Eyes: Pupils are equal, round, and reactive to light.  Neck: Normal range of motion.  Cardiovascular: Normal rate.  Respiratory: Effort normal.  GI: Soft.  Truncal obesity  Genitourinary:  Genitourinary Comments: Minimal left CVAT at present.     Assessment/Plan  1 - LEFT Renal Stone - risks, benefits, alternatives, expected peri-procedure course for lithotripsy explained previously and reiterated today.  Alexis Frock, MD 10/13/2016, 6:17 AM

## 2016-11-24 ENCOUNTER — Ambulatory Visit (INDEPENDENT_AMBULATORY_CARE_PROVIDER_SITE_OTHER): Payer: Medicare Other | Admitting: Gastroenterology

## 2016-11-24 ENCOUNTER — Encounter: Payer: Self-pay | Admitting: Gastroenterology

## 2016-11-24 ENCOUNTER — Other Ambulatory Visit: Payer: Medicare Other

## 2016-11-24 VITALS — BP 140/80 | HR 90 | Ht 62.0 in | Wt 170.0 lb

## 2016-11-24 DIAGNOSIS — K5732 Diverticulitis of large intestine without perforation or abscess without bleeding: Secondary | ICD-10-CM | POA: Diagnosis not present

## 2016-11-24 DIAGNOSIS — R945 Abnormal results of liver function studies: Secondary | ICD-10-CM

## 2016-11-24 DIAGNOSIS — R7989 Other specified abnormal findings of blood chemistry: Secondary | ICD-10-CM | POA: Diagnosis not present

## 2016-11-24 LAB — HEPATIC FUNCTION PANEL
ALT: 60 U/L — ABNORMAL HIGH (ref 0–35)
AST: 40 U/L — ABNORMAL HIGH (ref 0–37)
Albumin: 4.2 g/dL (ref 3.5–5.2)
Alkaline Phosphatase: 74 U/L (ref 39–117)
Bilirubin, Direct: 0.1 mg/dL (ref 0.0–0.3)
Total Bilirubin: 0.7 mg/dL (ref 0.2–1.2)
Total Protein: 7.1 g/dL (ref 6.0–8.3)

## 2016-11-24 NOTE — Patient Instructions (Signed)
Go to the basement for labs today Follow up in 6 months 

## 2016-11-24 NOTE — Progress Notes (Signed)
Linda [name]    [id number]    13-May-1946  Primary Care Physician:[facility]  Referring Physician: Leonides Sake, MD [address] [place], [place] [address]  Chief complaint:  Status post diverticulitis  HPI: 71 year old female here for follow-up visit after recent episode of left lower quadrant abdominal pain, noted to have uncomplicated diverticulitis and 11 mm kidney stone on CT abdomen and pelvis. She was on antibiotics which resolved diverticulitis patient subsequently underwent lithotripsy. She is currently pain-free. Denies any nausea, vomiting, abdominal pain, melena or bright red blood per rectum She has elevated AST and ALT based on labs in November 2017. Patient reports taking Tylenol, Advil and full dose aspirin for pain control during the episode of diverticulitis, nephrolithiasis and also has intermittent knee pain. Denies excessive alcohol use or herbal remedies Colonoscopy April 2017 showed severe sigmoid diverticulosis and small internal hemorrhoids  Outpatient Encounter Prescriptions as of 11/24/2016  Medication Sig  . Multiple Vitamins-Minerals (CENTRUM ADULTS PO) Take by mouth.  . Omega-3 Fatty Acids (EQL OMEGA 3 FISH OIL PO) Take 300 mg by mouth daily.  . [DISCONTINUED] cefUROXime (CEFTIN) 250 MG tablet Take 1 tablet (250 mg total) by mouth 2 (two) times daily with a meal.  . [DISCONTINUED] metroNIDAZOLE (FLAGYL) 500 MG tablet Take 1 tablet (500 mg total) by mouth 3 (three) times daily.  . [DISCONTINUED] oxyCODONE-acetaminophen (PERCOCET/ROXICET) 5-325 MG tablet Take 1-2 tablets by mouth every 4 (four) hours as needed for severe pain.  . [DISCONTINUED] promethazine (PHENERGAN) 12.5 MG tablet Take 2 tablets (25 mg total) by mouth every 6 (six) hours as needed for nausea or vomiting.  . [DISCONTINUED] tamsulosin (FLOMAX) 0.4 MG CAPS capsule Take 1 capsule (0.4 mg total) by mouth daily.   No facility-administered encounter  medications on file as of 11/24/2016.     Allergies as of 11/24/2016 - Review Complete 11/24/2016  Allergen Reaction Noted  . Ciprofloxacin Itching 12/11/2015  . Codeine Nausea Only 12/11/2015  . Naproxen Hives 10/28/2011  . Nitrofurantoin monohyd macro Rash 10/28/2011  . Penicillins Rash 10/28/2011    Past Medical History:  Diagnosis Date  . Cholelithiasis 09/25/2016  . Complication of anesthesia    was told by anesthesia in 1970's to watch her heart during surgery  . Diverticulitis   . Kidney stone   . Kidney stones     Past Surgical History:  Procedure Laterality Date  . ABDOMINAL HYSTERECTOMY    . CESAREAN SECTION    . HEMORRHOID SURGERY    . KNEE SURGERY     x3  . TOTAL SHOULDER REPLACEMENT      Family History  Problem Relation Age of Onset  . Nephrolithiasis Other   . Heart disease Father   . Colon cancer Neg Hx     Social History   Social History  . Marital status: Widowed    Spouse name: N/A  . Number of children: N/A  . Years of education: N/A   Occupational History  . Not on file.   Social History Main Topics  . Smoking status: Former Smoker    Years: 7.00  . Smokeless tobacco: Never Used  . Alcohol use No  . Drug use: No  . Sexual activity: Not on file   Other Topics Concern  . Not on file   Social History Narrative  . No narrative on file      Review of systems: Review of Systems  Constitutional: Negative for  fever and chills.  HENT: Negative.   Eyes: Negative for blurred vision.  Respiratory: Negative for cough, shortness of breath and wheezing.   Cardiovascular: Negative for chest pain and palpitations.  Gastrointestinal: as per HPI Genitourinary: Negative for dysuria, urgency, frequency and hematuria.  Musculoskeletal: Positive for myalgias, back pain and joint pain.  Skin: Negative for itching and rash.  Neurological: Negative for dizziness, tremors, focal weakness, seizures and loss of consciousness.  Endo/Heme/Allergies:  Positive for seasonal allergies.  Psychiatric/Behavioral: Negative for depression, suicidal ideas and hallucinations.  All other systems reviewed and are negative.   Physical Exam: Vitals:   11/24/16 0916  BP: 140/80  Pulse: 90   Body mass index is 31.09 kg/m. Gen:      No acute distress HEENT:  EOMI, sclera anicteric Neck:     No masses; no thyromegaly Lungs:    Clear to auscultation bilaterally; normal respiratory effort CV:         Regular rate and rhythm; no murmurs Abd:      + bowel sounds; soft, non-tender; no palpable masses, no distension Ext:    No edema; adequate peripheral perfusion Skin:      Warm and dry; no rash Neuro: alert and oriented x 3 Psych: normal mood and affect  Data Reviewed:  Reviewed labs, radiology imaging, old records and pertinent past GI work up   Assessment and Plan/Recommendations: 71 year old female with history of severe sigmoid diverticulosis here for follow-up visit after recent episode of diverticulitis in November 2017. At that time she was also noted to have a 11 mm stone in the left renal pelvis. I personally reviewed the CT that was done in November 2017 and appears that changes associated with sigmoid diverticulosis her chronic and does not appear to be acute diverticulitis. 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 Greater than 50% of the time used for counseling as well as treatment plan and follow-up. She had multiple questions which were answered to her  satisfaction  K. Denzil Magnuson , MD [phone] Mon-Fri 8a-5p [phone] after 5p, weekends, holidays  CC: Hamrick, Lorin Mercy, MD

## 2016-12-08 ENCOUNTER — Telehealth: Payer: Self-pay | Admitting: Gastroenterology

## 2016-12-08 DIAGNOSIS — Z8719 Personal history of other diseases of the digestive system: Secondary | ICD-10-CM

## 2016-12-08 DIAGNOSIS — R1032 Left lower quadrant pain: Secondary | ICD-10-CM

## 2016-12-08 DIAGNOSIS — Z87442 Personal history of urinary calculi: Secondary | ICD-10-CM

## 2016-12-08 NOTE — Telephone Encounter (Signed)
You will also need labs prior to CT     You have been scheduled for a CT scan of the abdomen and pelvis at [facility] ([address]---this is in the same building as Press [facility]).   You are scheduled on 12/10/16 at 215 pm. You should arrive 15 minutes prior to your appointment time for registration. Please follow the written instructions below on the day of your exam:  WARNING: IF YOU ARE ALLERGIC TO IODINE/X-RAY DYE, PLEASE NOTIFY RADIOLOGY IMMEDIATELY AT [phone]! YOU WILL BE GIVEN A 13 HOUR PREMEDICATION PREP.  1) Do not eat or drink anything after 1015 am (4 hours prior to your test) 2) You have been given 2 bottles of oral contrast to drink. The solution may taste   better if refrigerated, but do NOT add ice or any other liquid to this solution. Shake  well before drinking.    Drink 1 bottle of contrast @ 1215 pm (2 hours prior to your exam)  Drink 1 bottle of contrast @ 115 pm (1 hour prior to your exam)  You may take any medications as prescribed with a small amount of water except for the following: Metformin, Glucophage, Glucovance, Avandamet, Riomet, Fortamet, Actoplus Met, Janumet, Glumetza or Metaglip. The above medications must be held the day of the exam AND 48 hours after the exam.  The purpose of you drinking the oral contrast is to aid in the visualization of your intestinal tract. The contrast solution may cause some diarrhea. Before your exam is started, you will be given a small amount of fluid to drink. Depending on your individual set of symptoms, you may also receive an intravenous injection of x-ray contrast/dye. Plan on being at [facility] for 30 minutes or longer, depending on the type of exam you are having performed.  This test typically takes 30-45 minutes to complete.  If you have any questions regarding your exam or if you need to reschedule, you may call the CT department at [phone] between the hours of 8:00 am and 5:00 pm,  Monday-Friday.  _________________________________________________________________ The patient has been notified of this information and all questions answered.

## 2016-12-08 NOTE — Telephone Encounter (Signed)
Patient calling back about advise, and wants to know what to do about the pain.

## 2016-12-08 NOTE — Telephone Encounter (Signed)
Ok to take Tylenol as needed for pain control

## 2016-12-08 NOTE — Telephone Encounter (Signed)
Passed a kidney stone in Nov/Dec, lithotripsy in Dec.  Had diverticulitis at that time.  Has recently passed another kidney stone last week.  Began having LLQ pain 5/10, is taking tylenol and had a left over abx from shoulder surgery and took 4 yesterday (CLINDAMYCIN) has not noticed any relief.  Please advise

## 2016-12-08 NOTE — Telephone Encounter (Signed)
Please advise patient to stop Clindamycin. Will request CT abd & pelvis, pain could be secondary to kidney stone vs diverticulitis. Donot recommend antibiotics unless she has definitive features of diverticulitis.

## 2016-12-09 ENCOUNTER — Other Ambulatory Visit (INDEPENDENT_AMBULATORY_CARE_PROVIDER_SITE_OTHER): Payer: Medicare Other

## 2016-12-09 DIAGNOSIS — Z8719 Personal history of other diseases of the digestive system: Secondary | ICD-10-CM | POA: Diagnosis not present

## 2016-12-09 DIAGNOSIS — Z87442 Personal history of urinary calculi: Secondary | ICD-10-CM | POA: Diagnosis not present

## 2016-12-09 DIAGNOSIS — R1032 Left lower quadrant pain: Secondary | ICD-10-CM | POA: Diagnosis not present

## 2016-12-09 LAB — BUN: BUN: 12 mg/dL (ref 6–23)

## 2016-12-09 LAB — CREATININE, SERUM: Creatinine, Ser: 0.7 mg/dL (ref 0.40–1.20)

## 2016-12-09 NOTE — Telephone Encounter (Signed)
Left message for patient to call back  

## 2016-12-10 ENCOUNTER — Ambulatory Visit (INDEPENDENT_AMBULATORY_CARE_PROVIDER_SITE_OTHER)
Admission: RE | Admit: 2016-12-10 | Discharge: 2016-12-10 | Disposition: A | Discharge status: Home / Self Care | Payer: Medicare Other | Source: Ambulatory Visit | Attending: Gastroenterology | Admitting: Gastroenterology

## 2016-12-10 DIAGNOSIS — Z87442 Personal history of urinary calculi: Secondary | ICD-10-CM | POA: Diagnosis not present

## 2016-12-10 DIAGNOSIS — Z8719 Personal history of other diseases of the digestive system: Secondary | ICD-10-CM

## 2016-12-10 DIAGNOSIS — R1032 Left lower quadrant pain: Secondary | ICD-10-CM | POA: Diagnosis not present

## 2016-12-10 MED ORDER — IOPAMIDOL (ISOVUE-300) INJECTION 61%
100.0000 mL | Freq: Once | INTRAVENOUS | Status: AC | PRN
  Administered 2016-12-10: 100 mL via INTRAVENOUS

## 2016-12-10 NOTE — Telephone Encounter (Signed)
Left message for patient to call back  

## 2016-12-10 NOTE — Telephone Encounter (Signed)
Patient notified

## 2016-12-11 ENCOUNTER — Other Ambulatory Visit: Payer: Self-pay

## 2016-12-11 ENCOUNTER — Telehealth: Payer: Self-pay | Admitting: Gastroenterology

## 2016-12-11 MED ORDER — AMOXICILLIN-POT CLAVULANATE 875-125 MG PO TABS: 1.0000 | ORAL_TABLET | Freq: Two times a day (BID) | ORAL | 0 refills | Status: AC

## 2016-12-11 MED ORDER — HYOSCYAMINE SULFATE SL 0.125 MG SL SUBL: 1.0000 | SUBLINGUAL_TABLET | Freq: Four times a day (QID) | SUBLINGUAL | 1 refills | Status: DC | PRN

## 2016-12-11 NOTE — Telephone Encounter (Signed)
See results notes for details.  

## 2016-12-12 ENCOUNTER — Telehealth: Payer: Self-pay | Admitting: Gastroenterology

## 2016-12-12 NOTE — Telephone Encounter (Signed)
Pt calling wanting to know how long she needs to stay on the low fiber diet. Please advise.

## 2016-12-12 NOTE — Telephone Encounter (Signed)
Spoke with pt and she is aware.

## 2016-12-12 NOTE — Telephone Encounter (Signed)
For 2 weeks and given the severity of sigmoid diverticulosis, its probably best for to avoid high fiber diet. Thanks

## 2017-01-14 ENCOUNTER — Encounter: Payer: Self-pay | Admitting: Gastroenterology

## 2017-01-14 ENCOUNTER — Ambulatory Visit (INDEPENDENT_AMBULATORY_CARE_PROVIDER_SITE_OTHER): Payer: Medicare Other | Admitting: Gastroenterology

## 2017-01-14 ENCOUNTER — Other Ambulatory Visit: Payer: Self-pay | Admitting: *Deleted

## 2017-01-14 VITALS — BP 136/90 | HR 92 | Ht 62.0 in | Wt 165.0 lb

## 2017-01-14 DIAGNOSIS — K5730 Diverticulosis of large intestine without perforation or abscess without bleeding: Secondary | ICD-10-CM | POA: Diagnosis not present

## 2017-01-14 DIAGNOSIS — K5732 Diverticulitis of large intestine without perforation or abscess without bleeding: Secondary | ICD-10-CM

## 2017-01-14 NOTE — Progress Notes (Signed)
Linda [name]    [id number]    [birth year]  Primary Care Physician:Nathan Darrold Junker  Referring Physician: [facility] [address] [place], [place] [address]  Chief complaint:  Recurrent diverticulitis HPI: 71 year old female here for follow-up visit after recent episode of sigmoid diverticulitis in January 2018, prior to that she had an episode of sigmoid diverticulitis in November 2017. Around the same time she also passed renal stone. Last colonoscopy in April 2017 with evidence of severe sigmoid diverticulosis and small internal hemorrhoids, exam was otherwise unremarkable. She is currently asymptomatic after completion of antibiotic course, trying to avoid high-fiber diet. Denies any nausea, vomiting, abdominal pain, melena or bright red blood per rectum    Outpatient Encounter Prescriptions as of 01/14/2017  Medication Sig  . Multiple Vitamins-Minerals (CENTRUM ADULTS PO) Take by mouth.  . Omega-3 Fatty Acids (EQL OMEGA 3 FISH OIL PO) Take 300 mg by mouth daily.  . [DISCONTINUED] Hyoscyamine Sulfate SL (LEVSIN/SL) 0.125 MG SUBL Place 1 tablet under the tongue every 6 (six) hours as needed. For abdominal cramping (Patient not taking: Reported on 01/14/2017)   No facility-administered encounter medications on file as of 01/14/2017.     Allergies as of 01/14/2017 - Review Complete 01/14/2017  Allergen Reaction Noted  . Ciprofloxacin Itching 12/11/2015  . Codeine Nausea Only 12/11/2015  . Naproxen Hives 10/28/2011  . Nitrofurantoin monohyd macro Rash 10/28/2011  . Penicillins Rash 10/28/2011    Past Medical History:  Diagnosis Date  . Cholelithiasis 09/25/2016  . Complication of anesthesia    was told by anesthesia in 1970's to watch her heart during surgery  . Diverticulitis   . Kidney stone   . Kidney stones     Past Surgical History:  Procedure Laterality Date  . ABDOMINAL HYSTERECTOMY    . CESAREAN SECTION    . HEMORRHOID  SURGERY    . KNEE SURGERY     x3  . TOTAL SHOULDER REPLACEMENT      Family History  Problem Relation Age of Onset  . Nephrolithiasis Other   . Heart disease Father   . Colon cancer Neg Hx     Social History   Social History  . Marital status: Widowed    Spouse name: N/A  . Number of children: N/A  . Years of education: N/A   Occupational History  . Not on file.   Social History Main Topics  . Smoking status: Former Smoker    Years: 7.00  . Smokeless tobacco: Never Used  . Alcohol use No  . Drug use: No  . Sexual activity: Not on file   Other Topics Concern  . Not on file   Social History Narrative  . No narrative on file      Review of systems: Review of Systems  Constitutional: Negative for fever and chills.  HENT: Negative.   Eyes: Negative for blurred vision.  Respiratory: Negative for cough, shortness of breath and wheezing.   Cardiovascular: Negative for chest pain and palpitations.  Gastrointestinal: as per HPI Genitourinary: Negative for dysuria, urgency, frequency and hematuria.  Musculoskeletal: Negative for myalgias, back pain and joint pain.  Skin: Negative for itching and rash.  Neurological: Negative for dizziness, tremors, focal weakness, seizures and loss of consciousness.  Endo/Heme/Allergies: Positive for seasonal allergies.  Psychiatric/Behavioral: Negative for depression, suicidal ideas and hallucinations.  All other systems reviewed and are negative.   Physical Exam: Vitals:   01/14/17 1962  BP: 136/90  Pulse: 92   Body mass index is 30.18 kg/m. Gen:      No acute distress HEENT:  EOMI, sclera anicteric Neck:     No masses; no thyromegaly Lungs:    Clear to auscultation bilaterally; normal respiratory effort CV:         Regular rate and rhythm; no murmurs Abd:      + bowel sounds; soft, non-tender; no palpable masses, no distension Ext:    No edema; adequate peripheral perfusion Skin:      Warm and dry; no rash Neuro: alert  and oriented x 3 Psych: normal mood and affect  Data Reviewed:  Reviewed labs, radiology imaging, old records and pertinent past GI work up  CT abdomen and pelvis without contrast 09/25/16 1. 11 mm calculus within the left renal pelvis, suspected to be obstructive as there is mild left hydronephrosis. 2. Additional small bilateral nonobstructive nephrolithiasis as above. 3. Hazy inflammatory stranding about several sigmoid diverticula, compatible with concomitant acute sigmoid diverticulitis. No evidence for perforation or other complication. 4. Cholelithiasis. 5. Moderate aorto bi-iliac atherosclerotic disease.  CT abdomen and pelvis with contrast 12/10/16 1. Mild diffuse mucosal edema and soft tissue inflammation at the mid sigmoid colon, with associated inflamed diverticula, compatible with acute diverticulitis. Trace associated soft tissue inflammation tracks about the left side of the vaginal cuff. No definite evidence of perforation or abscess formation at this time. Associated mildly prominent nodes seen. Given the similar appearance on the recent prior CT, sigmoidoscopy would be helpful after completion of treatment for diverticulitis, to exclude underlying mass. 2. Nonobstructing bilateral renal stones, measuring up to 2 mm in size. 3. Small bilateral renal cysts seen. 4. Scattered aortic atherosclerosis. 5. Minimal degenerative change along the lower lumbar spine.  Assessment and Plan/Recommendations:  71 year old female with history of nephrolithiasis and severe sigmoid diverticulosis here for follow-up visit after recurrent sigmoid diverticulitis. She had 2 episodes of sigmoid diverticulitis confirmed based on imaging in the past 3 months. We'll refer to [facility] Dr. Leighton Ruff to consider possible sigmoid colectomy to prevent recurrent episodes of diverticulitis. Diverticular disease is mostly confined to the sigmoid colon Continue low fiber diet Due for recall  colonoscopy in April 2027 Return as needed 25 minutes was spent face-to-face with the patient. Greater than 50% of the time used for counseling as well as treatment plan and follow-up. She had multiple questions which were answered to her satisfaction  K. Denzil Magnuson , MD [phone] Mon-Fri 8a-5p [phone] after 5p, weekends, holidays  CC: Practice, [employer]*

## 2017-01-14 NOTE — Patient Instructions (Signed)
We will refer you to see [facility], we will contact you when that appointment becomes available

## 2017-01-15 ENCOUNTER — Telehealth: Payer: Self-pay | Admitting: *Deleted

## 2017-01-15 NOTE — Telephone Encounter (Signed)
Referral to [facility] for recurrent diverticulitis   01/27/2017 at 10:30am  Pt aware

## 2017-01-27 ENCOUNTER — Other Ambulatory Visit: Payer: Self-pay | Admitting: General Surgery

## 2017-01-27 NOTE — H&P (Signed)
History of Present Illness Leighton Ruff MD; 2/33/0076 10:53 AM) The patient is a 71 year old female who presents with diverticulitis. The patient presents today for evaluation of recurrent diverticulitis. She states that her first episode was in 2017. She continues to have ongoing pain every 3-4 months. Her last documented episode was in January 2018. She has 2 CT documented cases currently. These are both uncomplicated. Her main complaint is pain which waxes and wanes. She denies any history of constipation. She denies any bleeding. Her last colonoscopy in 2017 was normal except for severe diverticulosis.   Past Surgical History Mammie Lorenzo, LPN; 01/05/3334 45:62 AM) Cesarean Section - 1 Hemorrhoidectomy Hysterectomy (not due to cancer) - Partial Knee Surgery Right. Shoulder Surgery Left.  Diagnostic Studies History Mammie Lorenzo, LPN; 5/63/8937 34:28 AM) Colonoscopy within last year Mammogram within last year Pap Smear 1-5 years ago  Allergies Mammie Lorenzo, LPN; 7/68/1157 26:20 AM) Codeine and Related Nausea. Penicillins Rash. Naproxen *ANALGESICS - ANTI-INFLAMMATORY* Hives, Itching. Cipro *FLUOROQUINOLONES* Itching. Macrobid *URINARY ANTI-INFECTIVES* Hives.  Medication History Mammie Lorenzo, LPN; 3/55/9741 63:84 AM) Multivitamin Adult (Oral) Active. Fish Oil (1000MG  Capsule, Oral) Active. Medications Reconciled  Social History Mammie Lorenzo, LPN; 5/36/4680 32:12 AM) Caffeine use Carbonated beverages, Tea. No alcohol use No drug use Tobacco use Former smoker.  Family History Mammie Lorenzo, LPN; 2/48/2500 37:04 AM) Cerebrovascular Accident Mother. Heart Disease Father. Heart disease in female family member before age 59 Hypertension Father, Mother, Sister. Kidney Disease Father, Mother. Thyroid problems Sister.  Pregnancy / Birth History Mammie Lorenzo, LPN; 8/88/9169 45:03 AM) Age at menarche 82 years. Age of menopause  5-60 Gravida 1 Maternal age 9-40 Para 1  Other Problems Mammie Lorenzo, LPN; 8/88/2800 34:91 AM) Back Pain Bladder Problems Cholelithiasis Diverticulosis Gastroesophageal Reflux Disease Hemorrhoids Hypercholesterolemia Kidney Stone     Review of Systems Claiborne Billings Dockery LPN; 7/91/5056 97:94 AM) General Not Present- Appetite Loss, Chills, Fatigue, Fever, Night Sweats, Weight Gain and Weight Loss. Skin Present- Dryness. Not Present- Change in Wart/Mole, Hives, Jaundice, New Lesions, Non-Healing Wounds, Rash and Ulcer. HEENT Present- Ringing in the Ears, Seasonal Allergies and Wears glasses/contact lenses. Not Present- Earache, Hearing Loss, Hoarseness, Nose Bleed, Oral Ulcers, Sinus Pain, Sore Throat, Visual Disturbances and Yellow Eyes. Respiratory Not Present- Bloody sputum, Chronic Cough, Difficulty Breathing, Snoring and Wheezing. Breast Not Present- Breast Mass, Breast Pain, Nipple Discharge and Skin Changes. Cardiovascular Not Present- Chest Pain, Difficulty Breathing Lying Down, Leg Cramps, Palpitations, Rapid Heart Rate, Shortness of Breath and Swelling of Extremities. Gastrointestinal Present- Abdominal Pain. Not Present- Bloating, Bloody Stool, Change in Bowel Habits, Chronic diarrhea, Constipation, Difficulty Swallowing, Excessive gas, Gets full quickly at meals, Hemorrhoids, Indigestion, Nausea, Rectal Pain and Vomiting. Female Genitourinary Not Present- Frequency, Nocturia, Painful Urination, Pelvic Pain and Urgency. Musculoskeletal Not Present- Back Pain, Joint Pain, Joint Stiffness, Muscle Pain, Muscle Weakness and Swelling of Extremities. Neurological Not Present- Decreased Memory, Fainting, Headaches, Numbness, Seizures, Tingling, Tremor, Trouble walking and Weakness. Psychiatric Not Present- Anxiety, Bipolar, Change in Sleep Pattern, Depression, Fearful and Frequent crying. Endocrine Present- Hair Changes. Not Present- Cold Intolerance, Excessive Hunger, Heat  Intolerance, Hot flashes and New Diabetes. Hematology Not Present- Blood Thinners, Easy Bruising, Excessive bleeding, Gland problems, HIV and Persistent Infections.  Vitals Claiborne Billings Dockery LPN; 06/10/6552 74:82 AM) 01/27/2017 10:08 AM Weight: 165.8 lb Height: 62in Body Surface Area: 1.77 m Body Mass Index: 30.32 kg/m  Temp.: 98.67F(Oral)  Pulse: 100 (Regular)  BP: 124/78 (Sitting, Left Arm, Standard)      Physical Exam Elmo Putt  Marcello Moores MD; 01/27/2017 10:54 AM)  General Mental Status-Alert. General Appearance-Not in acute distress. Build & Nutrition-Well nourished. Posture-Normal posture. Gait-Normal.  Head and Neck Head-normocephalic, atraumatic with no lesions or palpable masses. Trachea-midline.  Chest and Lung Exam Chest and lung exam reveals -on auscultation, normal breath sounds, no adventitious sounds and normal vocal resonance.  Cardiovascular Cardiovascular examination reveals -normal heart sounds, regular rate and rhythm with no murmurs and no digital clubbing, cyanosis, edema, increased warmth or tenderness.  Abdomen Inspection Inspection of the abdomen reveals - No Hernias. Palpation/Percussion Palpation and Percussion of the abdomen reveal - Soft, Non Tender, No Rigidity (guarding), No hepatosplenomegaly and No Palpable abdominal masses.  Neurologic Neurologic evaluation reveals -alert and oriented x 3 with no impairment of recent or remote memory, normal attention span and ability to concentrate, normal sensation and normal coordination.  Musculoskeletal Normal Exam - Bilateral-Upper Extremity Strength Normal and Lower Extremity Strength Normal.    Assessment & Plan Leighton Ruff MD; 04/07/4131 10:29 AM)  DIVERTICULITIS, COLON (K57.32) Impression: 71 year old female who presents to the office for evaluation of recurrent diverticulitis. She has had 2 documented cases of uncomplicated diverticulitis by CT scan. She continues  to have ongoing pain in between episodes. We discussed medical management versus resection. She would like to proceed with resection. I would like to wait until mid May to do the surgery and allow for her current inflammation to resolve and avoid an ostomy if possible. The surgery and anatomy were described to the patient as well as the risks of surgery and the possible complications. These include: Bleeding, deep abdominal infections and possible wound complications such as hernia and infection, damage to adjacent structures, leak of surgical connections, which can lead to other surgeries and possibly an ostomy, possible need for other procedures, such as abscess drains in radiology, possible prolonged hospital stay, possible diarrhea from removal of part of the colon, possible constipation from narcotics, possible bowel, bladder or sexual dysfunction if having rectal surgery, prolonged fatigue/weakness or appetite loss, possible early recurrence of of disease, possible complications of their medical problems such as heart disease or arrhythmias or lung problems, death (less than 1%). I believe the patient understands and wishes to proceed with the surgery.

## 2017-01-27 NOTE — H&P (Deleted)
    History of Present Illness Linda Ruff MD; 12/07/7865 10:43 AM) The patient is a 71 year old female who presents with Crohn's disease. 71 year old female with a recent diagnosis of Crohn's disease who presented to the hospital with severe perirectal discomfort. She was taken to the operating room and a large abscess was drained that appeared to be arising from a posterior midline fissure. An opening was made in the left lateral perianal region. They were originally packing this at home. I saw her 2 weeks ago and stopped the packing. 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  Medication History Nance Pear, [place]; 01/27/2017 10:12 AM) Lenda Kelp 1.5/30 (1.5-30MG -MCG Tablet, Oral) Active. Polyethylene Glycol 3350 (2 capfulls Oral) Active. PredniSONE (20MG  Tablet, Oral) Active. Multivitamin (Oral) Active. Medications Reconciled  Social History Linda Ruff, MD; 4/65/0354 10:44 AM) Alcohol use Occasional alcohol use. Caffeine use Coffee. No drug use Tobacco use Never smoker.  Family History Linda Ruff, MD; 6/56/8127 10:44 AM) Heart disease in female family member before age 38  Pregnancy / Birth History Linda Ruff, MD; 03/26/16 10:44 AM) Age at menarche 48 years. Contraceptive History Oral contraceptives. Gravida 0 Para 0 Regular periods  Other Problems Linda Ruff, MD; 4/94/4967 10:44 AM) Crohn's Disease    Vitals (Sade Bradford CMA; 01/27/2017 10:13 AM) 01/27/2017 10:12 AM Weight: 164.2 lb Height: 65in Body Surface Area: 1.82 m Body Mass  Index: 27.32 kg/m  Temp.: 98.88F  Pulse: 76 (Regular)  BP: 112/78 (Sitting, Left Arm, Standard)      Physical Exam Linda Ruff MD; 5/91/6384 10:44 AM)  General Mental Status-Alert. General Appearance-Not in acute distress. Build & Nutrition-Well nourished. Posture-Normal posture. Gait-Normal.  Head and Neck Head-normocephalic, atraumatic with no lesions or palpable masses. Trachea-midline.  Chest and Lung Exam Chest and lung exam reveals -on auscultation, normal breath sounds, no adventitious sounds and normal vocal resonance.  Cardiovascular Cardiovascular examination reveals -normal heart sounds, regular rate and rhythm with no murmurs.  Abdomen Inspection Inspection of the abdomen reveals - No Hernias. Palpation/Percussion Palpation and Percussion of the abdomen reveal - Soft, Non Tender, No Rigidity (guarding), No hepatosplenomegaly and No Palpable abdominal masses.  Rectal Anorectal Exam External - Note: large anal fissure, left lateral external opening.  Neurologic Neurologic evaluation reveals -alert and oriented x 3 with no impairment of recent or remote memory, normal attention span and ability to concentrate, normal sensation and normal coordination.  Musculoskeletal Normal Exam - Bilateral-Upper Extremity Strength Normal and Lower Extremity Strength Normal.    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 She is hoping to start Humira within the next month. On exam she has a perirectal fissure and fistula. I have recommended seton placement to allow for this to heal appropriately. We will try to do this in the next 2-3 weeks.

## 2017-02-02 IMAGING — CT CT ABD-PELV W/ CM
2 of 5 series · 14 of 46 positions shown, 16 images · IV contrast (ISOVUE 300)
Comparison: CT of the abdomen and pelvis from 09/25/2016, and
abdominal radiograph performed 10/28/2016

CLINICAL DATA: Acute onset of left lower quadrant abdominal pain
and lower pelvic pain. Nausea. Initial encounter.

EXAM:
CT ABDOMEN AND PELVIS WITH CONTRAST
TECHNIQUE: Multidetector CT imaging of the abdomen and pelvis was performed
using the standard protocol following bolus administration of
intravenous contrast.
CONTRAST:  100mL [id number] IOPAMIDOL (IAXY7V-PWW) INJECTION 61%

[Series 2: abd/pel w · axial · 0.76mm/px · z∈[-398,+2]mm · 11 of 90 slices shown, 13 images]
[im 5/90  soft-tissue]
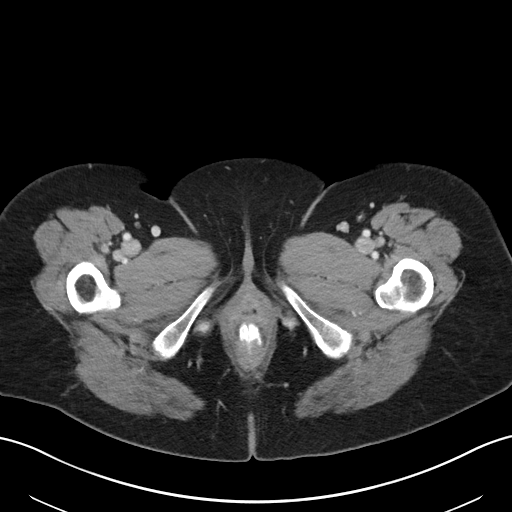
[im 5/90  bone]
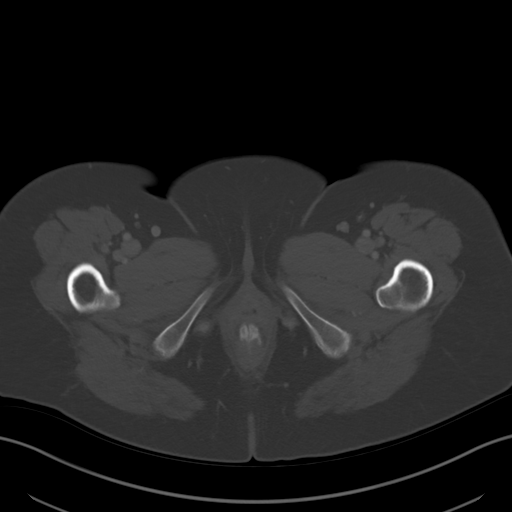
[im 15/90  soft-tissue]
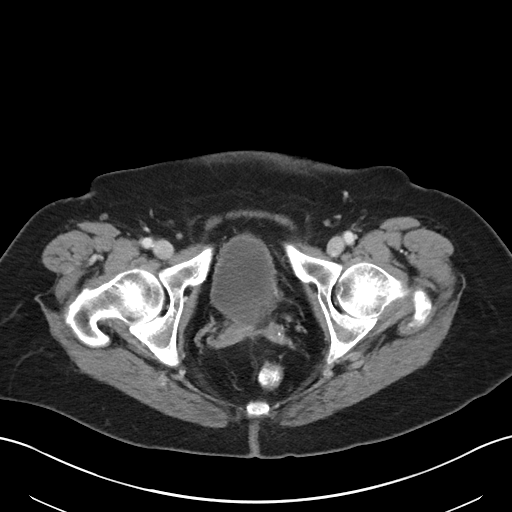
[im 20/90  soft-tissue]
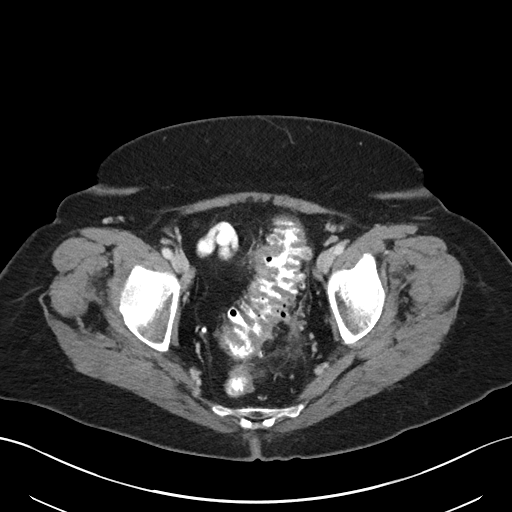
[im 30/90  soft-tissue]
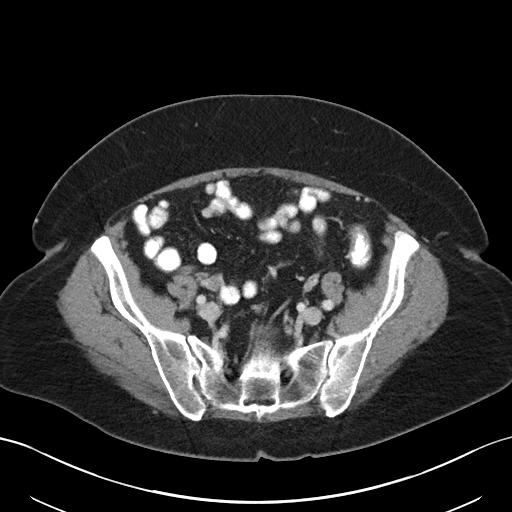
[im 35/90  soft-tissue]
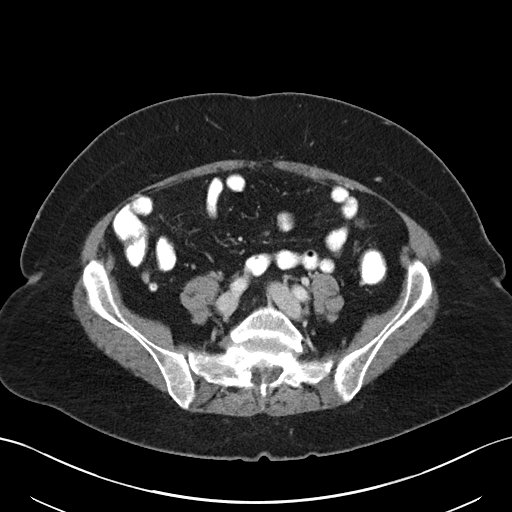
[im 45/90  soft-tissue]
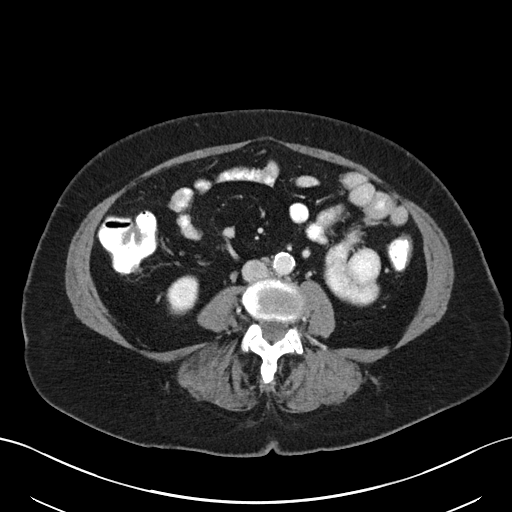
[im 55/90  soft-tissue]
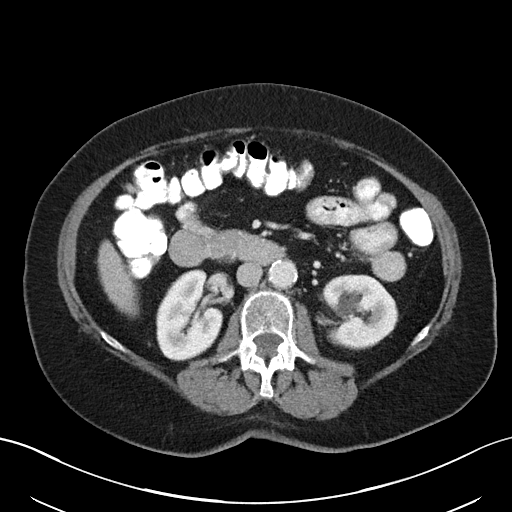
[im 60/90  soft-tissue]
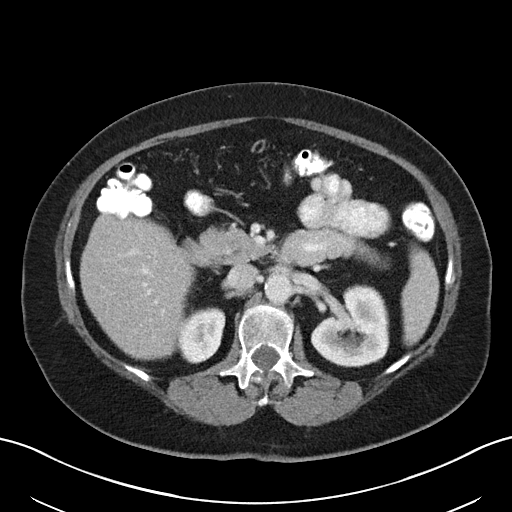
[im 70/90  soft-tissue]
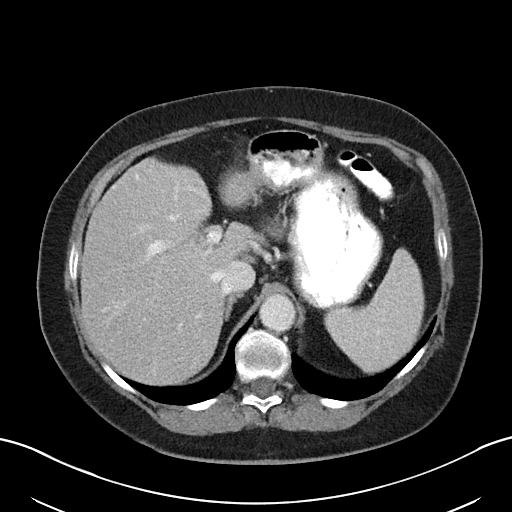
[im 70/90  bone]
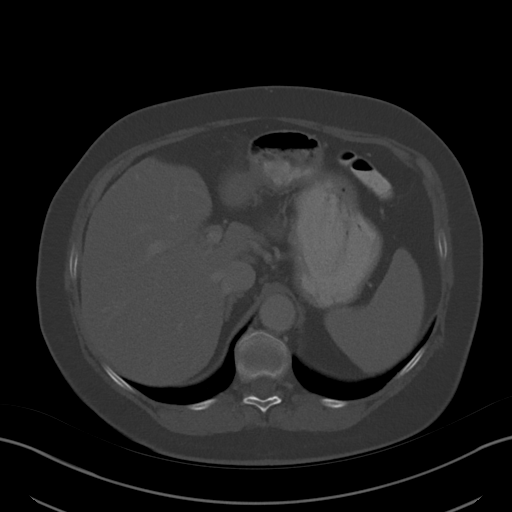
[im 75/90  soft-tissue]
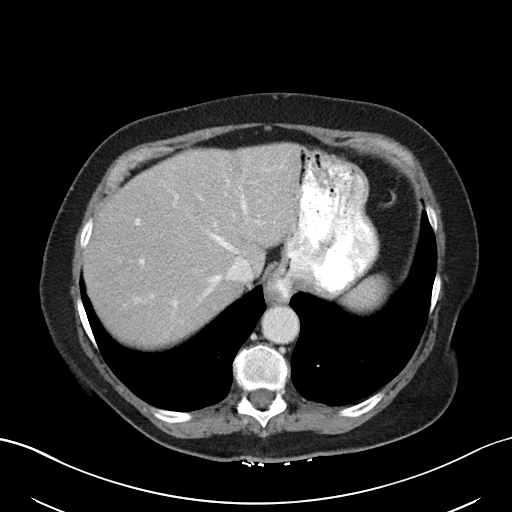
[im 85/90  soft-tissue]
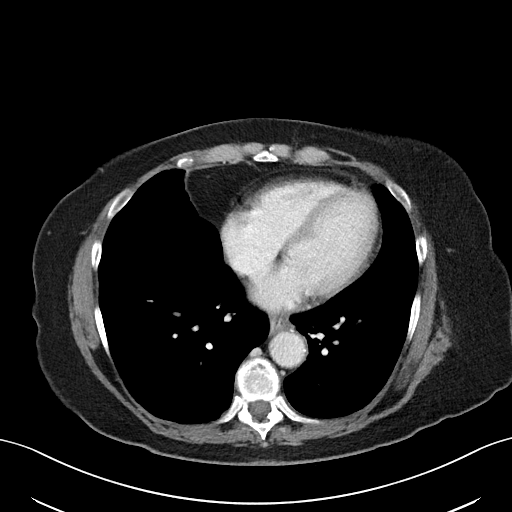

[Series 6: abd/pel w st · coronal · 0.69mm/px · 3 of 84 slices shown]
[im 28/84  soft-tissue]
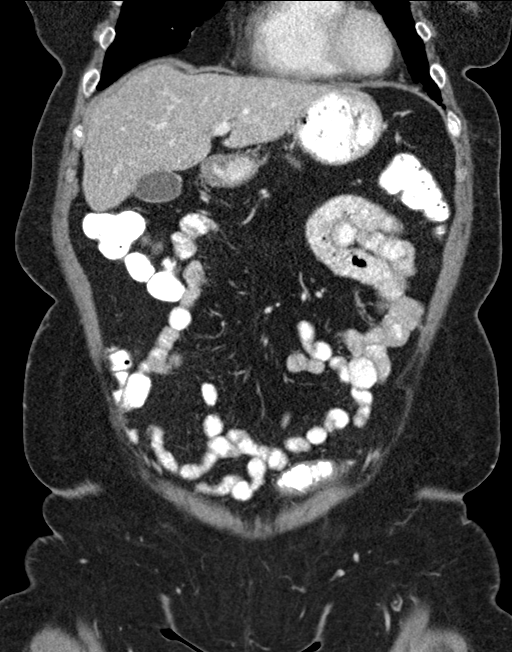
[im 37/84  soft-tissue]
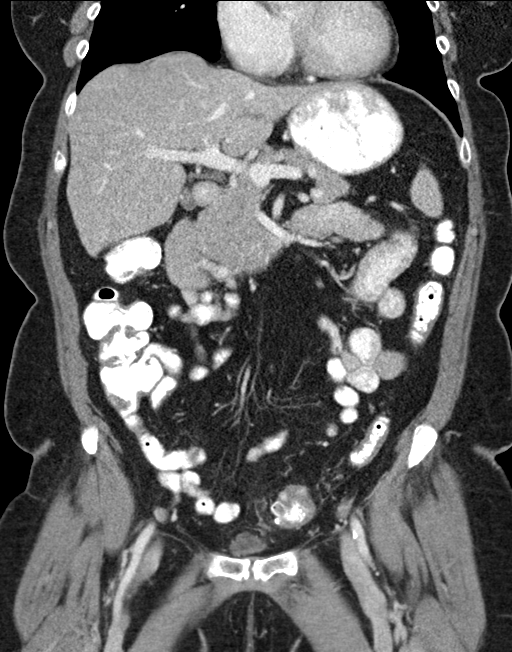
[im 47/84  soft-tissue]
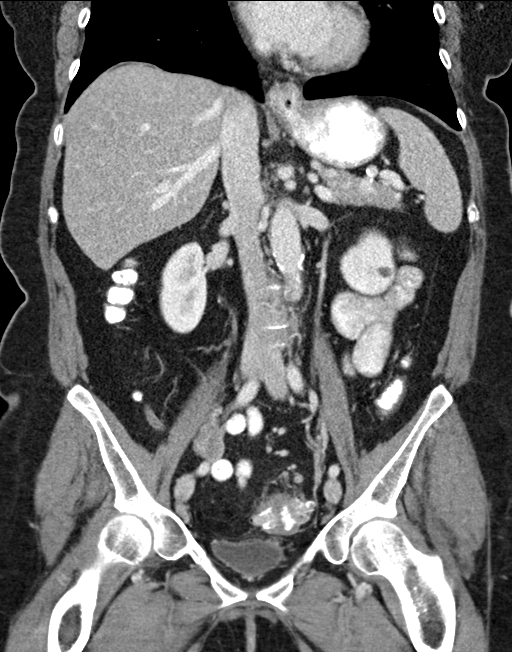

[14 of 46 positions shown; findings below may reference images not displayed]

FINDINGS: Lower chest: The visualized lung bases are grossly clear. The
visualized portions of the mediastinum are unremarkable.

Hepatobiliary: The liver is unremarkable in appearance. The
gallbladder is unremarkable in appearance. The common bile duct
remains normal in caliber.

Pancreas: The pancreas is within normal limits.

Spleen: The spleen is unremarkable in appearance.

Adrenals/Urinary Tract: The adrenal glands are unremarkable in
appearance.

Nonobstructing bilateral renal stones are seen, measuring up to 2 mm
in size. Small bilateral renal cysts are noted. There is no evidence
of hydronephrosis. No obstructing ureteral stones are identified. No
perinephric stranding is seen.

Stomach/Bowel: The stomach is unremarkable in appearance. The small
bowel is within normal limits. The appendix is normal in caliber,
without evidence of appendicitis.

There is mild diffuse mucosal edema and soft tissue inflammation
noted at the mid sigmoid colon, with associated inflamed
diverticula, compatible with acute diverticulitis. Trace associated
soft tissue inflammation tracks about the left side of the vaginal
cuff. There is no definite evidence of perforation or abscess
formation at this time. Associated mildly prominent nodes are noted.

Given the similar appearance on the recent prior CT, sigmoidoscopy
would be helpful after completion of treatment for diverticulitis,
to exclude underlying mass.

Vascular/Lymphatic: Scattered calcification is seen along the
abdominal aorta and its branches. The abdominal aorta is otherwise
grossly unremarkable. The inferior vena cava is grossly
unremarkable. No retroperitoneal lymphadenopathy is seen. No pelvic
sidewall lymphadenopathy is identified.

Reproductive: The bladder is mildly distended and grossly
unremarkable. The patient is status post hysterectomy. The left
ovary is unremarkable in appearance. No suspicious adnexal masses
are seen.

Other: No additional soft tissue abnormalities are seen.

Musculoskeletal: No acute osseous abnormalities are identified.
There is grade 1 anterolisthesis of L4 on L5, and mild grade 1
anterolisthesis of L5 on S1, with underlying vacuum phenomenon and
facet disease. The visualized musculature is unremarkable in
appearance.
IMPRESSION: 1. Mild diffuse mucosal edema and soft tissue inflammation at the
mid sigmoid colon, with associated inflamed diverticula, compatible
with acute diverticulitis. Trace associated soft tissue inflammation
tracks about the left side of the vaginal cuff. No definite evidence
of perforation or abscess formation at this time. Associated mildly
prominent nodes seen. Given the similar appearance on the recent
prior CT, sigmoidoscopy would be helpful after completion of
treatment for diverticulitis, to exclude underlying mass.
2. Nonobstructing bilateral renal stones, measuring up to 2 mm in
size.
3. Small bilateral renal cysts seen.
4. Scattered aortic atherosclerosis.
5. Minimal degenerative change along the lower lumbar spine.

## 2017-03-26 NOTE — Progress Notes (Signed)
EKG 09-25-16 epic

## 2017-03-27 NOTE — Patient Instructions (Signed)
BAYLIE DRAKES  03/27/2017   Your procedure is scheduled on: 04-08-17  Report to [place]  elevators to 3rd floor to  [facility] at 630AM.   Call this number if you have problems the morning of surgery [phone]    Remember: ONLY 1 PERSON MAY GO WITH YOU TO SHORT STAY TO GET  READY MORNING OF [place].  Do not eat food After Midnight on Monday 04-06-17. Drink plenty of clear liquids all day Tuesday 04-07-17 and follow all of Dr Marcello Moores' bowel prep instructions. Nothing by mouth after midnight on Tuesday!!     Take these medicines the morning of surgery with A SIP OF WATER: tylenol as needed                                You may not have any metal on your body including hair pins and              piercings  Do not wear jewelry, make-up, lotions, powders or perfumes, deodorant             Do not wear nail polish.  Do not shave  48 hours prior to surgery.     Do not bring valuables to the hospital. [place].  Contacts, dentures or bridgework may not be worn into surgery.  Leave suitcase in the car. After surgery it may be brought to your room.                Please read over the following fact sheets you were given: _____________________________________________________________________   CLEAR LIQUID DIET   Foods Allowed                                                                     Foods Excluded  Coffee and tea, regular and decaf                             liquids that you cannot  Plain Jell-O in any flavor                                             see through such as: Fruit ices (not with fruit pulp)                                     milk, soups, orange juice  Iced Popsicles                                    All solid food Carbonated beverages, regular and diet  Cranberry, grape and apple juices Sports drinks like  Gatorade Lightly seasoned clear broth or consume(fat free) Sugar, honey syrup  Sample Menu Breakfast                                Lunch                                     Supper Cranberry juice                    Beef broth                            Chicken broth Jell-O                                     Grape juice                           Apple juice Coffee or tea                        Jell-O                                      Popsicle                                                Coffee or tea                        Coffee or tea  _____________________________________________________________________  [facility] - Preparing for Surgery Before surgery, you can play an important role.  Because skin is not sterile, your skin needs to be as free of germs as possible.  You can reduce the number of germs on your skin by washing with CHG (chlorahexidine gluconate) soap before surgery.  CHG is an antiseptic cleaner which kills germs and bonds with the skin to continue killing germs even after washing. Please DO NOT use if you have an allergy to CHG or antibacterial soaps.  If your skin becomes reddened/irritated stop using the CHG and inform your nurse when you arrive at Short Stay. Do not shave (including legs and underarms) for at least 48 hours prior to the first CHG shower.  You may shave your face/neck. Please follow these instructions carefully:  1.  Shower with CHG Soap the night before surgery and the  morning of Surgery.  2.  If you choose to wash your hair, wash your hair first as usual with your  normal  shampoo.  3.  After you shampoo, rinse your hair and body thoroughly to remove the  shampoo.                           4.  Use CHG as you would any other liquid soap.  You can apply chg directly  to the skin and wash  Gently with a scrungie or clean washcloth.  5.  Apply the CHG Soap to your body ONLY FROM THE NECK DOWN.   Do not use on face/ open                            Wound or open sores. Avoid contact with eyes, ears mouth and genitals (private parts).                       Wash face,  Genitals (private parts) with your normal soap.             6.  Wash thoroughly, paying special attention to the area where your surgery  will be performed.  7.  Thoroughly rinse your body with warm water from the neck down.  8.  DO NOT shower/wash with your normal soap after using and rinsing off  the CHG Soap.                9.  Pat yourself dry with a clean towel.            10.  Wear clean pajamas.            11.  Place clean sheets on your bed the night of your first shower and do not  sleep with pets. Day of Surgery : Do not apply any lotions/deodorants the morning of surgery.  Please wear clean clothes to the hospital/surgery center.  FAILURE TO FOLLOW THESE INSTRUCTIONS MAY RESULT IN THE CANCELLATION OF YOUR SURGERY PATIENT SIGNATURE_________________________________  NURSE SIGNATURE__________________________________  ________________________________________________________________________

## 2017-03-31 ENCOUNTER — Encounter (HOSPITAL_COMMUNITY)
Admission: RE | Admit: 2017-03-31 | Discharge: 2017-03-31 | Disposition: A | Discharge status: Home / Self Care | Payer: Medicare Other | Source: Ambulatory Visit | Attending: General Surgery | Admitting: General Surgery

## 2017-03-31 ENCOUNTER — Encounter (HOSPITAL_COMMUNITY): Payer: Self-pay

## 2017-03-31 DIAGNOSIS — K5790 Diverticulosis of intestine, part unspecified, without perforation or abscess without bleeding: Secondary | ICD-10-CM | POA: Diagnosis not present

## 2017-03-31 DIAGNOSIS — Z01818 Encounter for other preprocedural examination: Secondary | ICD-10-CM | POA: Diagnosis present

## 2017-03-31 LAB — CBC
HCT: 47.1 % — ABNORMAL HIGH (ref 36.0–46.0)
Hemoglobin: 15.9 g/dL — ABNORMAL HIGH (ref 12.0–15.0)
MCH: 31.9 pg (ref 26.0–34.0)
MCHC: 33.8 g/dL (ref 30.0–36.0)
MCV: 94.6 fL (ref 78.0–100.0)
Platelets: 207 10*3/uL (ref 150–400)
RBC: 4.98 MIL/uL (ref 3.87–5.11)
RDW: 13.1 % (ref 11.5–15.5)
WBC: 7.6 10*3/uL (ref 4.0–10.5)

## 2017-03-31 LAB — BASIC METABOLIC PANEL
Anion gap: 9 (ref 5–15)
BUN: 9 mg/dL (ref 6–20)
CO2: 27 mmol/L (ref 22–32)
Calcium: 10.4 mg/dL — ABNORMAL HIGH (ref 8.9–10.3)
Chloride: 107 mmol/L (ref 101–111)
Creatinine, Ser: 0.75 mg/dL (ref 0.44–1.00)
GFR calc Af Amer: >60 mL/min (ref >60)
GFR calc non Af Amer: >60 mL/min (ref >60)
Glucose, Bld: 97 mg/dL (ref 65–99)
Potassium: 5.5 mmol/L — ABNORMAL HIGH (ref 3.5–5.1)
Sodium: 143 mmol/L (ref 135–145)

## 2017-03-31 LAB — ABO/RH: ABO/RH(D): O POS

## 2017-04-01 LAB — HEMOGLOBIN A1C
Hgb A1c MFr Bld: 5.2 % (ref 4.8–5.6)
Mean Plasma Glucose: 103 mg/dL

## 2017-04-08 ENCOUNTER — Encounter (HOSPITAL_COMMUNITY)
Admission: RE | Disposition: A | Discharge status: Home / Self Care | Payer: Self-pay | Source: Ambulatory Visit | Attending: General Surgery

## 2017-04-08 ENCOUNTER — Encounter (HOSPITAL_COMMUNITY): Payer: Self-pay | Admitting: Anesthesiology

## 2017-04-08 ENCOUNTER — Inpatient Hospital Stay (HOSPITAL_COMMUNITY)
Admission: RE | Admit: 2017-04-08 | Discharge: 2017-04-12 | DRG: 331 | Disposition: A | Discharge status: Home / Self Care | Payer: Medicare Other | Source: Ambulatory Visit | Attending: General Surgery | Admitting: General Surgery

## 2017-04-08 ENCOUNTER — Inpatient Hospital Stay (HOSPITAL_COMMUNITY): Payer: Medicare Other | Admitting: Anesthesiology

## 2017-04-08 DIAGNOSIS — Z87891 Personal history of nicotine dependence: Secondary | ICD-10-CM | POA: Diagnosis not present

## 2017-04-08 DIAGNOSIS — Z880 Allergy status to penicillin: Secondary | ICD-10-CM | POA: Diagnosis not present

## 2017-04-08 DIAGNOSIS — Z841 Family history of disorders of kidney and ureter: Secondary | ICD-10-CM

## 2017-04-08 DIAGNOSIS — Z8249 Family history of ischemic heart disease and other diseases of the circulatory system: Secondary | ICD-10-CM

## 2017-04-08 DIAGNOSIS — Z90710 Acquired absence of both cervix and uterus: Secondary | ICD-10-CM

## 2017-04-08 DIAGNOSIS — Z823 Family history of stroke: Secondary | ICD-10-CM

## 2017-04-08 DIAGNOSIS — Z881 Allergy status to other antibiotic agents status: Secondary | ICD-10-CM | POA: Diagnosis not present

## 2017-04-08 DIAGNOSIS — K5732 Diverticulitis of large intestine without perforation or abscess without bleeding: Principal | ICD-10-CM | POA: Diagnosis present

## 2017-04-08 DIAGNOSIS — Z883 Allergy status to other anti-infective agents status: Secondary | ICD-10-CM | POA: Diagnosis not present

## 2017-04-08 DIAGNOSIS — K5790 Diverticulosis of intestine, part unspecified, without perforation or abscess without bleeding: Secondary | ICD-10-CM | POA: Diagnosis present

## 2017-04-08 DIAGNOSIS — Z885 Allergy status to narcotic agent status: Secondary | ICD-10-CM

## 2017-04-08 DIAGNOSIS — K5792 Diverticulitis of intestine, part unspecified, without perforation or abscess without bleeding: Secondary | ICD-10-CM | POA: Diagnosis present

## 2017-04-08 LAB — TYPE AND SCREEN
ABO/RH(D): O POS
Antibody Screen: NEGATIVE

## 2017-04-08 SURGERY — COLECTOMY, PARTIAL, ROBOT-ASSISTED, LAPAROSCOPIC
Anesthesia: General | Site: Abdomen

## 2017-04-08 MED ORDER — DEXAMETHASONE SODIUM PHOSPHATE 10 MG/ML IJ SOLN
INTRAMUSCULAR | Status: DC | PRN
  Administered 2017-04-08: 10 mg via INTRAVENOUS

## 2017-04-08 MED ORDER — LACTATED RINGERS IV SOLN: INTRAVENOUS | Status: DC

## 2017-04-08 MED ORDER — FENTANYL CITRATE (PF) 100 MCG/2ML IJ SOLN: 25.0000 ug | INTRAMUSCULAR | Status: DC | PRN

## 2017-04-08 MED ORDER — PROPOFOL 10 MG/ML IV BOLUS
INTRAVENOUS | Status: AC
  Filled 2017-04-08: qty 20

## 2017-04-08 MED ORDER — 0.9 % SODIUM CHLORIDE (POUR BTL) OPTIME
TOPICAL | Status: DC | PRN
  Administered 2017-04-08: 2000 mL

## 2017-04-08 MED ORDER — ROCURONIUM BROMIDE 50 MG/5ML IV SOSY
PREFILLED_SYRINGE | INTRAVENOUS | Status: AC
  Filled 2017-04-08: qty 5

## 2017-04-08 MED ORDER — ROCURONIUM BROMIDE 10 MG/ML (PF) SYRINGE
PREFILLED_SYRINGE | INTRAVENOUS | Status: DC | PRN
  Administered 2017-04-08: 50 mg via INTRAVENOUS
  Administered 2017-04-08: 20 mg via INTRAVENOUS

## 2017-04-08 MED ORDER — SUGAMMADEX SODIUM 200 MG/2ML IV SOLN
INTRAVENOUS | Status: AC
  Filled 2017-04-08: qty 2

## 2017-04-08 MED ORDER — MORPHINE SULFATE (PF) 10 MG/ML IV SOLN
2.0000 mg | INTRAVENOUS | Status: DC | PRN
  Administered 2017-04-08: 2 mg via INTRAVENOUS
  Administered 2017-04-09 (×2): 4 mg via INTRAVENOUS
  Filled 2017-04-08 (×3): qty 1

## 2017-04-08 MED ORDER — CEFOTETAN DISODIUM 2 G IJ SOLR: 2.0000 g | INTRAMUSCULAR | Status: DC

## 2017-04-08 MED ORDER — SODIUM CHLORIDE 0.9 % IJ SOLN
INTRAMUSCULAR | Status: AC
  Filled 2017-04-08: qty 10

## 2017-04-08 MED ORDER — ONDANSETRON HCL 4 MG/2ML IJ SOLN: 4.0000 mg | Freq: Four times a day (QID) | INTRAMUSCULAR | Status: DC | PRN

## 2017-04-08 MED ORDER — EPHEDRINE SULFATE 50 MG/ML IJ SOLN
INTRAMUSCULAR | Status: DC | PRN
  Administered 2017-04-08 (×2): 5 mg via INTRAVENOUS

## 2017-04-08 MED ORDER — DIPHENHYDRAMINE HCL 12.5 MG/5ML PO ELIX: 12.5000 mg | ORAL_SOLUTION | Freq: Four times a day (QID) | ORAL | Status: DC | PRN

## 2017-04-08 MED ORDER — LACTATED RINGERS IV SOLN
INTRAVENOUS | Status: DC
  Administered 2017-04-08 (×3): via INTRAVENOUS

## 2017-04-08 MED ORDER — DIPHENHYDRAMINE HCL 50 MG/ML IJ SOLN: 12.5000 mg | Freq: Four times a day (QID) | INTRAMUSCULAR | Status: DC | PRN

## 2017-04-08 MED ORDER — METOCLOPRAMIDE HCL 5 MG/ML IJ SOLN
INTRAMUSCULAR | Status: AC
  Administered 2017-04-08: 10 mg via INTRAVENOUS
  Filled 2017-04-08: qty 2

## 2017-04-08 MED ORDER — EPHEDRINE 5 MG/ML INJ
INTRAVENOUS | Status: AC
  Filled 2017-04-08: qty 10

## 2017-04-08 MED ORDER — ALUM & MAG HYDROXIDE-SIMETH 200-200-20 MG/5ML PO SUSP: 30.0000 mL | Freq: Four times a day (QID) | ORAL | Status: DC | PRN

## 2017-04-08 MED ORDER — ALVIMOPAN 12 MG PO CAPS
12.0000 mg | ORAL_CAPSULE | Freq: Once | ORAL | Status: AC
  Administered 2017-04-08: 12 mg via ORAL
  Filled 2017-04-08: qty 1

## 2017-04-08 MED ORDER — ONDANSETRON HCL 4 MG/2ML IJ SOLN
INTRAMUSCULAR | Status: AC
  Filled 2017-04-08: qty 2

## 2017-04-08 MED ORDER — LIDOCAINE 2% (20 MG/ML) 5 ML SYRINGE
INTRAMUSCULAR | Status: AC
  Filled 2017-04-08: qty 20

## 2017-04-08 MED ORDER — MIDAZOLAM HCL 2 MG/2ML IJ SOLN
INTRAMUSCULAR | Status: DC | PRN
  Administered 2017-04-08: 2 mg via INTRAVENOUS

## 2017-04-08 MED ORDER — ONDANSETRON HCL 4 MG/2ML IJ SOLN
INTRAMUSCULAR | Status: DC | PRN
  Administered 2017-04-08: 4 mg via INTRAVENOUS

## 2017-04-08 MED ORDER — SUGAMMADEX SODIUM 200 MG/2ML IV SOLN
INTRAVENOUS | Status: DC | PRN
  Administered 2017-04-08: 150 mg via INTRAVENOUS

## 2017-04-08 MED ORDER — ALVIMOPAN 12 MG PO CAPS
12.0000 mg | ORAL_CAPSULE | Freq: Two times a day (BID) | ORAL | Status: DC
  Filled 2017-04-08: qty 1

## 2017-04-08 MED ORDER — LIDOCAINE 2% (20 MG/ML) 5 ML SYRINGE
INTRAMUSCULAR | Status: DC | PRN
  Administered 2017-04-08: 100 mg via INTRAVENOUS

## 2017-04-08 MED ORDER — SUCCINYLCHOLINE CHLORIDE 200 MG/10ML IV SOSY
PREFILLED_SYRINGE | INTRAVENOUS | Status: AC
  Filled 2017-04-08: qty 10

## 2017-04-08 MED ORDER — LIDOCAINE 2% (20 MG/ML) 5 ML SYRINGE
INTRAMUSCULAR | Status: DC | PRN
  Administered 2017-04-08: .5 mg/kg/h via INTRAVENOUS

## 2017-04-08 MED ORDER — METOCLOPRAMIDE HCL 5 MG/ML IJ SOLN
10.0000 mg | Freq: Once | INTRAMUSCULAR | Status: AC | PRN
  Administered 2017-04-08: 10 mg via INTRAVENOUS

## 2017-04-08 MED ORDER — SUFENTANIL CITRATE 50 MCG/ML IV SOLN
INTRAVENOUS | Status: DC | PRN
  Administered 2017-04-08 (×4): 10 ug via INTRAVENOUS
  Administered 2017-04-08 (×2): 5 ug via INTRAVENOUS

## 2017-04-08 MED ORDER — PROPOFOL 10 MG/ML IV BOLUS
INTRAVENOUS | Status: DC | PRN
  Administered 2017-04-08: 100 mg via INTRAVENOUS

## 2017-04-08 MED ORDER — MEPERIDINE HCL 50 MG/ML IJ SOLN: 6.2500 mg | INTRAMUSCULAR | Status: DC | PRN

## 2017-04-08 MED ORDER — KCL IN DEXTROSE-NACL 20-5-0.45 MEQ/L-%-% IV SOLN
INTRAVENOUS | Status: DC
  Administered 2017-04-08 – 2017-04-09 (×2): via INTRAVENOUS
  Administered 2017-04-09: 75 mL/h via INTRAVENOUS
  Administered 2017-04-10 – 2017-04-12 (×3): via INTRAVENOUS
  Filled 2017-04-08 (×8): qty 1000

## 2017-04-08 MED ORDER — DEXAMETHASONE SODIUM PHOSPHATE 10 MG/ML IJ SOLN
INTRAMUSCULAR | Status: AC
  Filled 2017-04-08: qty 1

## 2017-04-08 MED ORDER — ACETAMINOPHEN 500 MG PO TABS
1000.0000 mg | ORAL_TABLET | ORAL | Status: AC
  Administered 2017-04-08: 1000 mg via ORAL
  Filled 2017-04-08: qty 2

## 2017-04-08 MED ORDER — ACETAMINOPHEN 500 MG PO TABS
1000.0000 mg | ORAL_TABLET | Freq: Four times a day (QID) | ORAL | Status: DC
  Administered 2017-04-08 – 2017-04-09 (×5): 1000 mg via ORAL
  Filled 2017-04-08 (×6): qty 2

## 2017-04-08 MED ORDER — SUFENTANIL CITRATE 50 MCG/ML IV SOLN
INTRAVENOUS | Status: AC
  Filled 2017-04-08: qty 1

## 2017-04-08 MED ORDER — BUPIVACAINE LIPOSOME 1.3 % IJ SUSP
20.0000 mL | Freq: Once | INTRAMUSCULAR | Status: AC
  Administered 2017-04-08: 20 mL
  Filled 2017-04-08: qty 20

## 2017-04-08 MED ORDER — BUPIVACAINE HCL (PF) 0.25 % IJ SOLN
INTRAMUSCULAR | Status: AC
  Filled 2017-04-08: qty 30

## 2017-04-08 MED ORDER — GABAPENTIN 300 MG PO CAPS
300.0000 mg | ORAL_CAPSULE | ORAL | Status: AC
  Administered 2017-04-08: 300 mg via ORAL
  Filled 2017-04-08: qty 1

## 2017-04-08 MED ORDER — ENOXAPARIN SODIUM 40 MG/0.4ML ~~LOC~~ SOLN
40.0000 mg | SUBCUTANEOUS | Status: DC
  Administered 2017-04-09 – 2017-04-12 (×4): 40 mg via SUBCUTANEOUS
  Filled 2017-04-08 (×4): qty 0.4

## 2017-04-08 MED ORDER — FENTANYL CITRATE (PF) 100 MCG/2ML IJ SOLN
INTRAMUSCULAR | Status: AC
  Filled 2017-04-08: qty 2

## 2017-04-08 MED ORDER — ONDANSETRON HCL 4 MG PO TABS: 4.0000 mg | ORAL_TABLET | Freq: Four times a day (QID) | ORAL | Status: DC | PRN

## 2017-04-08 MED ORDER — MIDAZOLAM HCL 2 MG/2ML IJ SOLN
INTRAMUSCULAR | Status: AC
  Filled 2017-04-08: qty 2

## 2017-04-08 MED ORDER — CEFOTETAN DISODIUM-DEXTROSE 2-2.08 GM-% IV SOLR
2.0000 g | Freq: Once | INTRAVENOUS | Status: AC
  Administered 2017-04-08: 2 g via INTRAVENOUS
  Filled 2017-04-08: qty 50

## 2017-04-08 MED ORDER — BUPIVACAINE HCL (PF) 0.25 % IJ SOLN
INTRAMUSCULAR | Status: DC | PRN
  Administered 2017-04-08: 30 mL

## 2017-04-08 MED ORDER — SACCHAROMYCES BOULARDII 250 MG PO CAPS
250.0000 mg | ORAL_CAPSULE | Freq: Two times a day (BID) | ORAL | Status: DC
  Administered 2017-04-08 – 2017-04-12 (×8): 250 mg via ORAL
  Filled 2017-04-08 (×8): qty 1

## 2017-04-08 MED ORDER — LACTATED RINGERS IR SOLN
Status: DC | PRN
  Administered 2017-04-08: 2000 mL

## 2017-04-08 SURGICAL SUPPLY — 96 items
ADH SKN CLS APL DERMABOND .7 (GAUZE/BANDAGES/DRESSINGS) ×1
BLADE EXTENDED COATED 6.5IN (ELECTRODE) IMPLANT
CANNULA REDUC XI 12-8 STAPL (CANNULA) ×1
CANNULA REDUCER 12-8 DVNC XI (CANNULA) ×1 IMPLANT
CELLS DAT CNTRL 66122 CELL SVR (MISCELLANEOUS) IMPLANT
CLIP LIGATING HEM O LOK PURPLE (MISCELLANEOUS) IMPLANT
CLIP LIGATING HEMOLOK MED (MISCELLANEOUS) IMPLANT
COUNTER NEEDLE 20 DBL MAG RED (NEEDLE) ×2 IMPLANT
COVER MAYO STAND STRL (DRAPES) ×2 IMPLANT
COVER SURGICAL LIGHT HANDLE (MISCELLANEOUS) ×3 IMPLANT
COVER TIP SHEARS 8 DVNC (MISCELLANEOUS) ×1 IMPLANT
COVER TIP SHEARS 8MM DA VINCI (MISCELLANEOUS) ×1
DECANTER SPIKE VIAL GLASS SM (MISCELLANEOUS) ×1 IMPLANT
DERMABOND ADVANCED (GAUZE/BANDAGES/DRESSINGS) ×1
DERMABOND ADVANCED .7 DNX12 (GAUZE/BANDAGES/DRESSINGS) IMPLANT
DEVICE TROCAR PUNCTURE CLOSURE (ENDOMECHANICALS) IMPLANT
DRAIN CHANNEL 19F RND (DRAIN) IMPLANT
DRAPE ARM DVNC X/XI (DISPOSABLE) ×4 IMPLANT
DRAPE COLUMN DVNC XI (DISPOSABLE) ×1 IMPLANT
DRAPE DA VINCI XI ARM (DISPOSABLE) ×4
DRAPE DA VINCI XI COLUMN (DISPOSABLE) ×1
DRAPE SURG IRRIG POUCH 19X23 (DRAPES) ×2 IMPLANT
DRSG OPSITE POSTOP 4X10 (GAUZE/BANDAGES/DRESSINGS) IMPLANT
DRSG OPSITE POSTOP 4X6 (GAUZE/BANDAGES/DRESSINGS) ×1 IMPLANT
DRSG OPSITE POSTOP 4X8 (GAUZE/BANDAGES/DRESSINGS) IMPLANT
ELECT PENCIL ROCKER SW 15FT (MISCELLANEOUS) ×3 IMPLANT
ELECT REM PT RETURN 15FT ADLT (MISCELLANEOUS) ×2 IMPLANT
ENDOLOOP SUT PDS II  0 18 (SUTURE)
ENDOLOOP SUT PDS II 0 18 (SUTURE) IMPLANT
EVACUATOR SILICONE 100CC (DRAIN) IMPLANT
GAUZE SPONGE 4X4 12PLY STRL (GAUZE/BANDAGES/DRESSINGS) IMPLANT
GLOVE BIO SURGEON STRL SZ 6.5 (GLOVE) ×6 IMPLANT
GLOVE BIOGEL PI IND STRL 7.0 (GLOVE) ×3 IMPLANT
GLOVE BIOGEL PI INDICATOR 7.0 (GLOVE) ×3
GOWN STRL REUS W/TWL 2XL LVL3 (GOWN DISPOSABLE) ×6 IMPLANT
GOWN STRL REUS W/TWL XL LVL3 (GOWN DISPOSABLE) ×8 IMPLANT
GRASPER ENDOPATH ANVIL 10MM (MISCELLANEOUS) IMPLANT
HOLDER FOLEY CATH W/STRAP (MISCELLANEOUS) ×2 IMPLANT
IRRIG SUCT STRYKERFLOW 2 WTIP (MISCELLANEOUS) ×2
IRRIGATION SUCT STRKRFLW 2 WTP (MISCELLANEOUS) ×1 IMPLANT
KIT PROCEDURE DA VINCI SI (MISCELLANEOUS) ×1
KIT PROCEDURE DVNC SI (MISCELLANEOUS) ×1 IMPLANT
LEGGING LITHOTOMY PAIR STRL (DRAPES) ×2 IMPLANT
NDL INSUFFLATION 14GA 120MM (NEEDLE) ×1 IMPLANT
NEEDLE INSUFFLATION 14GA 120MM (NEEDLE) ×2 IMPLANT
PACK CARDIOVASCULAR III (CUSTOM PROCEDURE TRAY) ×2 IMPLANT
PACK COLON (CUSTOM PROCEDURE TRAY) ×2 IMPLANT
PORT LAP GEL ALEXIS MED 5-9CM (MISCELLANEOUS) ×1 IMPLANT
RELOAD STAPLE 45 BLU REG DVNC (STAPLE) IMPLANT
RELOAD STAPLE 45 GRN THCK DVNC (STAPLE) IMPLANT
RETRACTOR WND ALEXIS 18 MED (MISCELLANEOUS) IMPLANT
RTRCTR WOUND ALEXIS 18CM MED (MISCELLANEOUS)
SCISSORS LAP 5X35 DISP (ENDOMECHANICALS) ×2 IMPLANT
SEAL CANN UNIV 5-8 DVNC XI (MISCELLANEOUS) ×3 IMPLANT
SEAL XI 5MM-8MM UNIVERSAL (MISCELLANEOUS) ×3
SEALER VESSEL DA VINCI XI (MISCELLANEOUS) ×1
SEALER VESSEL EXT DVNC XI (MISCELLANEOUS) ×1 IMPLANT
SLEEVE ADV FIXATION 5X100MM (TROCAR) IMPLANT
SOLUTION ELECTROLUBE (MISCELLANEOUS) ×2 IMPLANT
STAPLER 45 BLU RELOAD XI (STAPLE) ×1 IMPLANT
STAPLER 45 BLUE RELOAD XI (STAPLE) ×1
STAPLER 45 GREEN RELOAD XI (STAPLE)
STAPLER 45 GRN RELOAD XI (STAPLE) IMPLANT
STAPLER CANNULA SEAL DVNC XI (STAPLE) ×1 IMPLANT
STAPLER CANNULA SEAL XI (STAPLE) ×1
STAPLER SHEATH (SHEATH) ×1
STAPLER SHEATH ENDOWRIST DVNC (SHEATH) ×1 IMPLANT
STAPLER VISISTAT 35W (STAPLE) ×1 IMPLANT
SUT ETHILON 2 0 PS N (SUTURE) IMPLANT
SUT NOVA NAB GS-21 0 18 T12 DT (SUTURE) ×2 IMPLANT
SUT PDS AB 1 CTX 36 (SUTURE) IMPLANT
SUT PDS AB 1 TP1 96 (SUTURE) IMPLANT
SUT PROLENE 2 0 KS (SUTURE) ×2 IMPLANT
SUT SILK 2 0 (SUTURE) ×2
SUT SILK 2 0 SH CR/8 (SUTURE) ×2 IMPLANT
SUT SILK 2-0 18XBRD TIE 12 (SUTURE) ×1 IMPLANT
SUT SILK 3 0 (SUTURE) ×2
SUT SILK 3 0 SH CR/8 (SUTURE) ×2 IMPLANT
SUT SILK 3-0 18XBRD TIE 12 (SUTURE) ×1 IMPLANT
SUT V-LOC BARB 180 2/0GR6 GS22 (SUTURE)
SUT VIC AB 2-0 SH 18 (SUTURE) ×2 IMPLANT
SUT VIC AB 2-0 SH 27 (SUTURE) ×2
SUT VIC AB 2-0 SH 27X BRD (SUTURE) ×1 IMPLANT
SUT VIC AB 3-0 SH 18 (SUTURE) IMPLANT
SUT VIC AB 4-0 PS2 27 (SUTURE) ×4 IMPLANT
SUT VICRYL 0 UR6 27IN ABS (SUTURE) ×2 IMPLANT
SUTURE V-LC BRB 180 2/0GR6GS22 (SUTURE) IMPLANT
SYR 10ML LL (SYRINGE) ×2 IMPLANT
SYS LAPSCP GELPORT 120MM (MISCELLANEOUS)
SYSTEM LAPSCP GELPORT 120MM (MISCELLANEOUS) IMPLANT
TOWEL OR 17X26 10 PK STRL BLUE (TOWEL DISPOSABLE) ×2 IMPLANT
TOWEL OR NON WOVEN STRL DISP B (DISPOSABLE) ×2 IMPLANT
TRAY FOLEY W/METER SILVER 16FR (SET/KITS/TRAYS/PACK) ×1 IMPLANT
TROCAR ADV FIXATION 5X100MM (TROCAR) ×2 IMPLANT
TUBING CONNECTING 10 (TUBING) IMPLANT
TUBING INSUFFLATION 10FT LAP (TUBING) ×2 IMPLANT

## 2017-04-08 NOTE — Anesthesia Postprocedure Evaluation (Signed)
Anesthesia Post Note  Patient: Linda [name]  Procedure(s) Performed: Procedure(s) (LRB): XI ROBOTIC SIGMOIDECTOMY (N/A)  Patient location during evaluation: PACU Anesthesia Type: General Level of consciousness: awake and alert Pain management: pain level controlled Vital Signs Assessment: post-procedure vital signs reviewed and stable Respiratory status: spontaneous breathing, nonlabored ventilation, respiratory function stable and patient connected to nasal cannula oxygen Cardiovascular status: blood pressure returned to baseline and stable Postop Assessment: no signs of nausea or vomiting Anesthetic complications: no       Last Vitals:  Vitals:   04/08/17 1230 04/08/17 1330  BP: (!) 147/76 136/79  Pulse: 66 69  Resp: 14 16  Temp: 36.5 C 36.7 C    Last Pain:  Vitals:   04/08/17 1433  TempSrc:   PainSc: 2                  Montez Hageman

## 2017-04-08 NOTE — Op Note (Signed)
04/08/2017  11:10 AM  PATIENT:  Linda [name]  71 y.o. female  Patient Care Team: Cyndi Bender, PA-C as PCP - General (Physician Assistant)  PRE-OPERATIVE DIAGNOSIS:  diverticular disease  POST-OPERATIVE DIAGNOSIS:  diverticular disease  PROCEDURE:   XI ROBOTIC SIGMOIDECTOMY  :  Surgeon(s): Michael Boston, MD Leighton Ruff, MD  ASSISTANT: Dr Johney Maine   ANESTHESIA:   local and general  EBL: 135ml  Total I/O In: 1000 [I.V.:1000] Out: 75 [Urine:25; Blood:50]  Delay start of Pharmacological VTE agent (>24hrs) due to surgical blood loss or risk of bleeding:  no  DRAINS: none   SPECIMEN:  Source of Specimen:  Sigmoid colon  DISPOSITION OF SPECIMEN:  PATHOLOGY  COUNTS:  YES  PLAN OF CARE: Admit to inpatient   PATIENT DISPOSITION:  PACU - hemodynamically stable.  INDICATION:    71 y.o. F with recurrent diverticular disease.  I recommended segmental resection:  The anatomy & physiology of the digestive tract was discussed.  The pathophysiology was discussed.  Natural history risks without surgery was discussed.   I worked to give an overview of the disease and the frequent need to have multispecialty involvement.  I feel the risks of no intervention will lead to serious problems that outweigh the operative risks; therefore, I recommended a partial colectomy to remove the pathology.  Laparoscopic & open techniques were discussed.   Risks such as bleeding, infection, abscess, leak, reoperation, possible ostomy, hernia, heart attack, death, and other risks were discussed.  I noted a good likelihood this will help address the problem.   Goals of post-operative recovery were discussed as well.    The patient expressed understanding & wished to proceed with surgery.  OR FINDINGS:   Patient had obvious disease of the distal sigmoid colon.  The anastomosis rests 12 cm from the anal verge by rigid proctoscopy.  DESCRIPTION:   Informed consent was confirmed.  The patient  underwent general anaesthesia without difficulty.  The patient was positioned appropriately.  VTE prevention in place.  The patient's abdomen was clipped, prepped, & draped in a sterile fashion.  Surgical timeout confirmed our plan.  The patient was positioned in reverse Trendelenburg.  Abdominal entry was gained using a Varies needle in the LUQ.  Entry was clean.  I induced carbon dioxide insufflation.  An 55mm robotic port was placed in the RUQ.  Camera inspection revealed no injury.  Extra ports were carefully placed under direct laparoscopic visualization.  The robot was docked to the patient's left side.  Instruments were placed under direct visualization.     I reflected the greater omentum and the upper abdomen the small bowel in the upper abdomen.  I bluntly took down adhesions to the left pelvic wall.  I then sharply took down the bladder attachments to the sigmoid.  I then scored the base of peritoneum of the right side of the mesentery of the left colon from the ligament of Treitz to the peritoneal reflection of the mid rectum.  I elevated the sigmoid mesentery and enetered into the retro-mesenteric plane. We were able to identify the left ureter and gonadal vessels. We kept those posterior within the retroperitoneum and elevated the left colon mesentery off that. I did isolated IMA pedicle but did not ligate it yet.  I continued distally and got into the avascular plane posterior to the mesorectum. This allowed me to help mobilize the rectum as well by freeing the mesorectum off the sacrum.  I mobilized the peritoneal coverings towards the  peritoneal reflection on both the right and left sides of the rectum.  I could see the right and left ureters and stayed away from them.    I skeletonized the inferior mesenteric artery pedicle.  I went down to its takeoff from the aorta.   After confirming the left ureter was out of the way, I went ahead and ligated the inferior mesenteric artery pedicle with  bipolar robotic vessel sealer ~2cm above its takeoff from the aorta.   I did ligate the inferior mesenteric vein in a similar fashion.  We ensured hemostasis. I skeletonized the mesorectum at the junction at the proximal rectum using blunt dissection & bipolar vessel sealer.  I mobilized the left colon in a lateral to medial fashion off the line of Toldt up towards the splenic flexure to ensure good mobilization of the left colon to reach into the pelvis.  I skeletonized the rectosigmoid using the robotic vessel sealer. I then divided the rectosigmoid junction with a blue load robotic stapler. I confirmed the remaining colon will reach into the pelvis. We then undocked the robot and desufflated the abdomen. I enlarged the 12 mm right lower quadrant incision and placed an Wheeler wound protector.  The colon was brought out through this site and a pursestring was placed via a pursestring applicator. The colon was then divided. A 29 mm EEA was amber was placed and the pursestring was tied tightly around this. This was then placed back into the abdomen. The EEA stapler was introduced into the rectum and an anastomosis was created and a end-to-side fashion.  The anastomosis rests approximately 12 cm from the anal verge. There was no leak when tested with insufflation under water. The abdomen was irrigated. Hemostasis was good. The omentum was brought down over the abdominal contents. The ports were removed and the robot was undocked. The 12 mm port site was closed using a running 2-0 Vicryl for the peritoneum.  0 Novafil sutures were used to close the fascia in an interrupted fashion.  The subcutaneous tissue was reapproximated using 2-0 Vicryl suture. The skin was closed using a running 4-0 Vicryl suture. The port sites were also closed using 4-0 Vicryl suture and Dermabond. A dressing was placed over the extraction site. The patient was then awakened from anesthesia and sent to the postanesthesia care unit in stable  condition. All counts were correct per operating room staff.

## 2017-04-08 NOTE — Anesthesia Preprocedure Evaluation (Signed)
Anesthesia Evaluation  Patient identified by MRN, date of birth, ID band Patient awake    Reviewed: Allergy & Precautions, NPO status , Patient's Chart, lab work & pertinent test results  Airway Mallampati: II  TM Distance: >3 FB Neck ROM: Full    Dental no notable dental hx.    Pulmonary neg pulmonary ROS, former smoker,    Pulmonary exam normal breath sounds clear to auscultation       Cardiovascular negative cardio ROS Normal cardiovascular exam Rhythm:Regular Rate:Normal     Neuro/Psych negative neurological ROS  negative psych ROS   GI/Hepatic negative GI ROS, Neg liver ROS,   Endo/Other  negative endocrine ROS  Renal/GU negative Renal ROS  negative genitourinary   Musculoskeletal negative musculoskeletal ROS (+)   Abdominal   Peds negative pediatric ROS (+)  Hematology negative hematology ROS (+)   Anesthesia Other Findings   Reproductive/Obstetrics negative OB ROS                             Anesthesia Physical Anesthesia Plan  ASA: II  Anesthesia Plan: General   Post-op Pain Management:    Induction: Intravenous  Airway Management Planned: Oral ETT  Additional Equipment:   Intra-op Plan:   Post-operative Plan: Extubation in OR  Informed Consent: I have reviewed the patients History and Physical, chart, labs and discussed the procedure including the risks, benefits and alternatives for the proposed anesthesia with the patient or authorized representative who has indicated his/her understanding and acceptance.   Dental advisory given  Plan Discussed with: CRNA  Anesthesia Plan Comments:         Anesthesia Quick Evaluation

## 2017-04-08 NOTE — H&P (Signed)
The patient is a 71 year old female who presents with diverticulitis. The patient presents today for evaluation of recurrent diverticulitis. She states that her first episode was in 2017. She continues to have ongoing pain every 3-4 months. Her last documented episode was in January 2018. She has 2 CT documented cases currently. These are both uncomplicated. Her main complaint is pain which waxes and wanes. She denies any history of constipation. She denies any bleeding. Her last colonoscopy in 2017 was normal except for severe diverticulosis.   Past Surgical History  Cesarean Section - 1 Hemorrhoidectomy Hysterectomy (not due to cancer) - Partial Knee Surgery Right. Shoulder Surgery Left.  Diagnostic Studies History  Colonoscopy within last year Mammogram within last year Pap Smear 1-5 years ago  Allergies  Codeine and Related Nausea. Penicillins Rash. Naproxen *ANALGESICS - ANTI-INFLAMMATORY* Hives, Itching. Cipro *FLUOROQUINOLONES* Itching. Macrobid *URINARY ANTI-INFECTIVES* Hives.  Medication History Multivitamin Adult (Oral) Active. Fish Oil (1000MG  Capsule, Oral) Active. Medications Reconciled  Social History  Caffeine use Carbonated beverages, Tea. No alcohol use No drug use Tobacco use Former smoker.  Family History  Cerebrovascular Accident Mother. Heart Disease Father. Heart disease in female family member before age 60 Hypertension Father, Mother, Sister. Kidney Disease Father, Mother. Thyroid problems Sister.  Pregnancy / Birth History  Age at menarche 26 years. Age of menopause 74-60 Gravida 1 Maternal age 69-40 Para 1  Other Problems  Back Pain Bladder Problems Cholelithiasis Diverticulosis Gastroesophageal Reflux Disease Hemorrhoids Hypercholesterolemia Kidney Stone     Review of Systems General Not Present- Appetite Loss, Chills, Fatigue, Fever, Night Sweats, Weight Gain and  Weight Loss. Skin Present- Dryness. Not Present- Change in Wart/Mole, Hives, Jaundice, New Lesions, Non-Healing Wounds, Rash and Ulcer. HEENT Present- Ringing in the Ears, Seasonal Allergies and Wears glasses/contact lenses. Not Present- Earache, Hearing Loss, Hoarseness, Nose Bleed, Oral Ulcers, Sinus Pain, Sore Throat, Visual Disturbances and Yellow Eyes. Respiratory Not Present- Bloody sputum, Chronic Cough, Difficulty Breathing, Snoring and Wheezing. Breast Not Present- Breast Mass, Breast Pain, Nipple Discharge and Skin Changes. Cardiovascular Not Present- Chest Pain, Difficulty Breathing Lying Down, Leg Cramps, Palpitations, Rapid Heart Rate, Shortness of Breath and Swelling of Extremities. Gastrointestinal Present- Abdominal Pain. Not Present- Bloating, Bloody Stool, Change in Bowel Habits, Chronic diarrhea, Constipation, Difficulty Swallowing, Excessive gas, Gets full quickly at meals, Hemorrhoids, Indigestion, Nausea, Rectal Pain and Vomiting. Female Genitourinary Not Present- Frequency, Nocturia, Painful Urination, Pelvic Pain and Urgency. Musculoskeletal Not Present- Back Pain, Joint Pain, Joint Stiffness, Muscle Pain, Muscle Weakness and Swelling of Extremities. Neurological Not Present- Decreased Memory, Fainting, Headaches, Numbness, Seizures, Tingling, Tremor, Trouble walking and Weakness. Psychiatric Not Present- Anxiety, Bipolar, Change in Sleep Pattern, Depression, Fearful and Frequent crying. Endocrine Present- Hair Changes. Not Present- Cold Intolerance, Excessive Hunger, Heat Intolerance, Hot flashes and New Diabetes. Hematology Not Present- Blood Thinners, Easy Bruising, Excessive bleeding, Gland problems, HIV and Persistent Infections.  BP 118/67   Pulse 93   Temp 98.1 F (36.7 C) (Oral)   Resp 18   Ht 5' 1.5" (1.562 m)   Wt 73.9 kg (163 lb)   SpO2 96%   BMI 30.30 kg/m     Physical Exam   General Mental Status-Alert. General Appearance-Not in acute  distress. Build & Nutrition-Well nourished. Posture-Normal posture. Gait-Normal.  Head and Neck Head-normocephalic, atraumatic with no lesions or palpable masses. Trachea-midline.  Chest and Lung Exam Chest and lung exam reveals -on auscultation, normal breath sounds, no adventitious sounds and normal vocal resonance.  Cardiovascular Cardiovascular examination reveals -  normal heart sounds, regular rate and rhythm with no murmurs and no digital clubbing, cyanosis, edema, increased warmth or tenderness.  Abdomen Inspection Inspection of the abdomen reveals - No Hernias. Palpation/Percussion Palpation and Percussion of the abdomen reveal - Soft, Non Tender, No Rigidity (guarding), No hepatosplenomegaly and No Palpable abdominal masses.  Neurologic Neurologic evaluation reveals -alert and oriented x 3 with no impairment of recent or remote memory, normal attention span and ability to concentrate, normal sensation and normal coordination.  Musculoskeletal Normal Exam - Bilateral-Upper Extremity Strength Normal and Lower Extremity Strength Normal.    Assessment & Plan  DIVERTICULITIS, COLON (K57.32) Impression: 71 year old female who presents to the office for evaluation of recurrent diverticulitis. She has had 2 documented cases of uncomplicated diverticulitis by CT scan. She continues to have ongoing pain in between episodes. We discussed medical management versus resection. She would like to proceed with resection. I would like to wait until mid May to do the surgery and allow for her current inflammation to resolve and avoid an ostomy if possible. The surgery and anatomy were described to the patient as well as the risks of surgery and the possible complications. These include: Bleeding, deep abdominal infections and possible wound complications such as hernia and infection, damage to adjacent structures, leak of surgical connections, which can lead to other  surgeries and possibly an ostomy, possible need for other procedures, such as abscess drains in radiology, possible prolonged hospital stay, possible diarrhea from removal of part of the colon, possible constipation from narcotics, possible bowel, bladder or sexual dysfunction if having rectal surgery, prolonged fatigue/weakness or appetite loss, possible early recurrence of of disease, possible complications of their medical problems such as heart disease or arrhythmias or lung problems, death (less than 1%). I believe the patient understands and wishes to proceed with the surgery.

## 2017-04-08 NOTE — Anesthesia Procedure Notes (Signed)
Procedure Name: Intubation Date/Time: 04/08/2017 8:44 AM Performed by: Danley Danker L Patient Re-evaluated:Patient Re-evaluated prior to inductionOxygen Delivery Method: Circle system utilized Preoxygenation: Pre-oxygenation with 100% oxygen Intubation Type: IV induction Ventilation: Mask ventilation without difficulty and Oral airway inserted - appropriate to patient size Laryngoscope Size: Sabra Heck and 2 Grade View: Grade I Tube type: Oral Tube size: 7.5 mm Number of attempts: 1 Airway Equipment and Method: Stylet Placement Confirmation: ETT inserted through vocal cords under direct vision,  positive ETCO2 and breath sounds checked- equal and bilateral Secured at: 21 cm Tube secured with: Tape Dental Injury: Teeth and Oropharynx as per pre-operative assessment

## 2017-04-08 NOTE — Transfer of Care (Signed)
Immediate Anesthesia Transfer of Care Note  Patient: DELISHA PEADEN  Procedure(s) Performed: Procedure(s): XI ROBOTIC SIGMOIDECTOMY (N/A)  Patient Location: PACU  Anesthesia Type:General  Level of Consciousness: sedated  Airway & Oxygen Therapy: Patient Spontanous Breathing and Patient connected to face mask oxygen  Post-op Assessment: Report given to RN and Post -op Vital signs reviewed and stable  Post vital signs: Reviewed and stable  Last Vitals:  Vitals:   04/08/17 0601  BP: 118/67  Pulse: 93  Resp: 18  Temp: 36.7 C    Last Pain:  Vitals:   04/08/17 0626  TempSrc:   PainSc: 0-No pain      Patients Stated Pain Goal: 3 (76/15/18 3437)  Complications: No apparent anesthesia complications

## 2017-04-09 LAB — BASIC METABOLIC PANEL
Anion gap: 6 (ref 5–15)
BUN: 10 mg/dL (ref 6–20)
CO2: 25 mmol/L (ref 22–32)
Calcium: 8.9 mg/dL (ref 8.9–10.3)
Chloride: 107 mmol/L (ref 101–111)
Creatinine, Ser: 0.77 mg/dL (ref 0.44–1.00)
GFR calc Af Amer: >60 mL/min (ref >60)
GFR calc non Af Amer: >60 mL/min (ref >60)
Glucose, Bld: 127 mg/dL — ABNORMAL HIGH (ref 65–99)
Potassium: 4.5 mmol/L (ref 3.5–5.1)
Sodium: 138 mmol/L (ref 135–145)

## 2017-04-09 LAB — CBC
HCT: 38.2 % (ref 36.0–46.0)
Hemoglobin: 12.9 g/dL (ref 12.0–15.0)
MCH: 31.8 pg (ref 26.0–34.0)
MCHC: 33.8 g/dL (ref 30.0–36.0)
MCV: 94.1 fL (ref 78.0–100.0)
Platelets: 189 10*3/uL (ref 150–400)
RBC: 4.06 MIL/uL (ref 3.87–5.11)
RDW: 13.3 % (ref 11.5–15.5)
WBC: 12.3 10*3/uL — ABNORMAL HIGH (ref 4.0–10.5)

## 2017-04-09 MED ORDER — MORPHINE SULFATE (PF) 2 MG/ML IV SOLN
2.0000 mg | INTRAVENOUS | Status: DC | PRN
  Administered 2017-04-09: 4 mg via INTRAVENOUS
  Administered 2017-04-10: 21:00:00 2 mg via INTRAVENOUS
  Administered 2017-04-10: 4 mg via INTRAVENOUS
  Administered 2017-04-10 – 2017-04-12 (×6): 2 mg via INTRAVENOUS
  Filled 2017-04-09: qty 1
  Filled 2017-04-09: qty 2
  Filled 2017-04-09 (×2): qty 1
  Filled 2017-04-09: qty 2
  Filled 2017-04-09 (×4): qty 1
  Filled 2017-04-09: qty 2

## 2017-04-09 NOTE — Progress Notes (Signed)
1 Day Post-Op robotic sigmoidectomy Subjective: No nausea, pain controlled.    Objective: Vital signs in last 24 hours: Temp:  [97.4 F (36.3 C)-98.6 F (37 C)] 98.1 F (36.7 C) (05/31 0522) Pulse Rate:  [63-88] 63 (05/31 0522) Resp:  [13-18] 18 (05/31 0522) BP: (113-151)/(64-87) 124/64 (05/31 0522) SpO2:  [93 %-100 %] 94 % (05/31 0522)   Intake/Output from previous day: 05/30 0701 - 05/31 0700 In: 2390 [P.O.:60; I.V.:2330] Out: 1150 [Urine:1050; Blood:100] Intake/Output this shift: No intake/output data recorded.   General appearance: alert and cooperative GI: normal findings: soft, non-tender  Incision: no significant drainage  Lab Results:   Recent Labs  04/09/17 0511  WBC 12.3*  HGB 12.9  HCT 38.2  PLT 189   BMET  Recent Labs  04/09/17 0511  NA 138  K 4.5  CL 107  CO2 25  GLUCOSE 127*  BUN 10  CREATININE 0.77  CALCIUM 8.9   PT/INR No results for input(s): LABPROT, INR in the last 72 hours. ABG No results for input(s): PHART, HCO3 in the last 72 hours.  Invalid input(s): PCO2, PO2  MEDS, Scheduled . acetaminophen  1,000 mg Oral Q6H  . alvimopan  12 mg Oral BID  . enoxaparin (LOVENOX) injection  40 mg Subcutaneous Q24H  . saccharomyces boulardii  250 mg Oral BID    Studies/Results: No results found.  Assessment: s/p Procedure(s): XI ROBOTIC SIGMOIDECTOMY Patient Active Problem List   Diagnosis Date Noted  . Diverticular disease 04/08/2017  . Kidney stone 09/25/2016  . Diverticulosis of colon 09/25/2016  . Hydronephrosis of left kidney 09/25/2016  . Cholelithiasis 09/25/2016  . Atherosclerosis of abdominal aorta ([place]) 09/25/2016  . Renal colic on left side 82/99/3716    Expected post op course  Plan: d/c foley Advance diet to clears and advance to FLD as tolerated Ambulate in hall Decrease MIV   LOS: 1 day     .Rosario Adie, [facility], [place]   04/09/2017 7:44 AM

## 2017-04-09 NOTE — Evaluation (Signed)
Physical Therapy Evaluation Patient Details Name: Linda [name] MRN: [id number] DOB: [birth year] Today's Date: 04/09/2017   History of Present Illness   robotic sigmoidectomy 5/30 /18  Clinical Impression  The patient is mobilizing well. Very motivated to ambulate. Pt admitted with above diagnosis. Pt currently with functional limitations due to the deficits listed below (see PT Problem List). Pt will benefit from skilled PT to increase their independence and safety with mobility to allow discharge to the venue listed below.   '    Follow Up Recommendations No PT follow up    Equipment Recommendations  None recommended by PT    Recommendations for Other Services       Precautions / Restrictions Precautions Precaution Comments: abdomen      Mobility  Bed Mobility               General bed mobility comments: inrecliner  Transfers Overall transfer level: Needs assistance Equipment used: None Transfers: Sit to/from Stand              Ambulation/Gait Ambulation/Gait assistance: Min guard Ambulation Distance (Feet): 400 Feet Assistive device: None Gait Pattern/deviations: Drifts right/left     General Gait Details: slightly drifts when turns head, Holding abeoment during ambulation  Stairs            Wheelchair Mobility    Modified Rankin (Stroke Patients Only)       Balance Overall balance assessment: Needs assistance         Standing balance support: During functional activity;No upper extremity supported Standing balance-Leahy Scale: Fair                               Pertinent Vitals/Pain Pain Assessment: 0-10 Pain Score: 2  Pain Location: abdomen Pain Descriptors / Indicators: Cramping;Discomfort Pain Intervention(s): Monitored during session;Premedicated before session    Home Living Family/patient expects to be discharged to:: Private residence Living Arrangements: Alone   Type of Home: House Home Access:  Stairs to enter Entrance Stairs-Rails: None Entrance Stairs-Number of Steps: 3 with wall to support Home Layout: One level Home Equipment: None      Prior Function                 Hand Dominance        Extremity/Trunk Assessment   Upper Extremity Assessment Upper Extremity Assessment: Overall WFL for tasks assessed    Lower Extremity Assessment Lower Extremity Assessment: Overall WFL for tasks assessed    Cervical / Trunk Assessment Cervical / Trunk Assessment: Kyphotic  Communication      Cognition Arousal/Alertness: Awake/alert Behavior During Therapy: WFL for tasks assessed/performed Overall Cognitive Status: Within Functional Limits for tasks assessed                                        General Comments      Exercises     Assessment/Plan    PT Assessment Patient needs continued PT services  PT Problem List Decreased activity tolerance;Decreased mobility       PT Treatment Interventions Gait training    PT Goals (Current goals can be found in the Care Plan section)  Acute Rehab PT Goals Patient Stated Goal: to go home and take care of myself PT Goal Formulation: With patient Time For Goal Achievement: 04/16/17 Potential to Achieve Goals: Good  Frequency Min 2X/week   Barriers to discharge        Co-evaluation               AM-PAC PT "6 Clicks" Daily Activity  Outcome Measure Difficulty turning over in bed (including adjusting bedclothes, sheets and blankets)?: None Difficulty moving from lying on back to sitting on the side of the bed? : None Difficulty sitting down on and standing up from a chair with arms (e.g., wheelchair, bedside commode, etc,.)?: None Help needed moving to and from a bed to chair (including a wheelchair)?: None Help needed walking in hospital room?: A Little Help needed climbing 3-5 steps with a railing? : A Little 6 Click Score: 22    End of Session   Activity Tolerance: Patient  tolerated treatment well Patient left: in chair;with call bell/phone within reach   PT Visit Diagnosis: Unsteadiness on feet (R26.81);Pain    Time: 0917-0929 PT Time Calculation (min) (ACUTE ONLY): 12 min   Charges:   PT Evaluation $PT Eval Low Complexity: 1 Procedure     PT G CodesTresa Endo PT [phone]   Claretha Cooper 04/09/2017, 9:36 AM

## 2017-04-10 LAB — CBC
HCT: 39.2 % (ref 36.0–46.0)
Hemoglobin: 13 g/dL (ref 12.0–15.0)
MCH: 31.7 pg (ref 26.0–34.0)
MCHC: 33.2 g/dL (ref 30.0–36.0)
MCV: 95.6 fL (ref 78.0–100.0)
Platelets: 160 10*3/uL (ref 150–400)
RBC: 4.1 MIL/uL (ref 3.87–5.11)
RDW: 13.6 % (ref 11.5–15.5)
WBC: 7.7 10*3/uL (ref 4.0–10.5)

## 2017-04-10 LAB — BASIC METABOLIC PANEL
Anion gap: 5 (ref 5–15)
BUN: 6 mg/dL (ref 6–20)
CO2: 28 mmol/L (ref 22–32)
Calcium: 9.2 mg/dL (ref 8.9–10.3)
Chloride: 108 mmol/L (ref 101–111)
Creatinine, Ser: 0.68 mg/dL (ref 0.44–1.00)
GFR calc Af Amer: >60 mL/min (ref >60)
GFR calc non Af Amer: >60 mL/min (ref >60)
Glucose, Bld: 95 mg/dL (ref 65–99)
Potassium: 4 mmol/L (ref 3.5–5.1)
Sodium: 141 mmol/L (ref 135–145)

## 2017-04-10 LAB — C DIFFICILE QUICK SCREEN W PCR REFLEX
C Diff antigen: NEGATIVE
C Diff interpretation: NOT DETECTED
C Diff toxin: NEGATIVE

## 2017-04-10 MED ORDER — LOPERAMIDE HCL 2 MG PO CAPS: 2.0000 mg | ORAL_CAPSULE | Freq: Three times a day (TID) | ORAL | Status: DC | PRN

## 2017-04-10 MED ORDER — ZINC OXIDE 40 % EX OINT
TOPICAL_OINTMENT | CUTANEOUS | Status: DC | PRN
Start: 2017-04-10 — End: 2017-04-12
  Filled 2017-04-10: qty 114

## 2017-04-10 NOTE — Progress Notes (Signed)
2 Days Post-Op robotic sigmoidectomy Subjective: No nausea.  Having a lot of watery diarrhea   Objective: Vital signs in last 24 hours: Temp:  [97.8 F (36.6 C)-98.6 F (37 C)] 97.9 F (36.6 C) (06/01 0513) Pulse Rate:  [65-80] 74 (06/01 0513) Resp:  [16-20] 20 (06/01 0513) BP: (123-159)/(69-92) 159/92 (06/01 0513) SpO2:  [94 %-97 %] 95 % (06/01 0513)   Intake/Output from previous day: 05/31 0701 - 06/01 0700 In: 3312.9 [P.O.:1320; I.V.:1992.9] Out: 800 [Urine:800] Intake/Output this shift: No intake/output data recorded.   General appearance: alert and cooperative GI: normal findings: soft, non-tender  Incision: no significant drainage  Lab Results:   Recent Labs  04/09/17 0511 04/10/17 0613  WBC 12.3* 7.7  HGB 12.9 13.0  HCT 38.2 39.2  PLT 189 160   BMET  Recent Labs  04/09/17 0511 04/10/17 0613  NA 138 141  K 4.5 4.0  CL 107 108  CO2 25 28  GLUCOSE 127* 95  BUN 10 6  CREATININE 0.77 0.68  CALCIUM 8.9 9.2   PT/INR No results for input(s): LABPROT, INR in the last 72 hours. ABG No results for input(s): PHART, HCO3 in the last 72 hours.  Invalid input(s): PCO2, PO2  MEDS, Scheduled . enoxaparin (LOVENOX) injection  40 mg Subcutaneous Q24H  . saccharomyces boulardii  250 mg Oral BID    Studies/Results: No results found.  Assessment: s/p Procedure(s): XI ROBOTIC SIGMOIDECTOMY Patient Active Problem List   Diagnosis Date Noted  . Diverticular disease 04/08/2017  . Kidney stone 09/25/2016  . Diverticulosis of colon 09/25/2016  . Hydronephrosis of left kidney 09/25/2016  . Cholelithiasis 09/25/2016  . Atherosclerosis of abdominal aorta ([place]) 09/25/2016  . Renal colic on left side 62/01/5596    Expected post op course  Plan: d/c foley Advance diet to soft foods as tolerated Ambulate in hall Cont MIV Will send stool for C diff.  If neg, will start imodium prn desitin for anal irritation   LOS: 2 days     .Rosario Adie,  [facility], [place] [phone]   04/10/2017 9:15 AM

## 2017-04-11 LAB — BASIC METABOLIC PANEL
Anion gap: 8 (ref 5–15)
BUN: 5 mg/dL — ABNORMAL LOW (ref 6–20)
CO2: 27 mmol/L (ref 22–32)
Calcium: 9.5 mg/dL (ref 8.9–10.3)
Chloride: 106 mmol/L (ref 101–111)
Creatinine, Ser: 0.65 mg/dL (ref 0.44–1.00)
GFR calc Af Amer: >60 mL/min (ref >60)
GFR calc non Af Amer: >60 mL/min (ref >60)
Glucose, Bld: 100 mg/dL — ABNORMAL HIGH (ref 65–99)
Potassium: 3.9 mmol/L (ref 3.5–5.1)
Sodium: 141 mmol/L (ref 135–145)

## 2017-04-11 LAB — CBC
HCT: 42.1 % (ref 36.0–46.0)
Hemoglobin: 14.2 g/dL (ref 12.0–15.0)
MCH: 31.9 pg (ref 26.0–34.0)
MCHC: 33.7 g/dL (ref 30.0–36.0)
MCV: 94.6 fL (ref 78.0–100.0)
Platelets: 174 10*3/uL (ref 150–400)
RBC: 4.45 MIL/uL (ref 3.87–5.11)
RDW: 13.5 % (ref 11.5–15.5)
WBC: 8.1 10*3/uL (ref 4.0–10.5)

## 2017-04-11 NOTE — Progress Notes (Signed)
Patient ID: ANYELIN MOGLE, female   DOB: 1946/08/27, 71 y.o.   MRN: [id number] 3 Days Post-Op   Subjective: Feels much better today. He was having a lot of pain yesterday that is significantly improved. Diarrhea has slowed and had to semi-formed bowel movements this morning. Tolerating diet.  Objective: Vital signs in last 24 hours: Temp:  [98.1 F (36.7 C)-98.7 F (37.1 C)] 98.1 F (36.7 C) (06/02 0450) Pulse Rate:  [77-85] 77 (06/02 0450) Resp:  [18] 18 (06/02 0450) BP: (135-156)/(63-86) 144/86 (06/02 0450) SpO2:  [93 %-96 %] 93 % (06/02 0450) Last BM Date: 04/10/17  Intake/Output from previous day: 06/01 0701 - 06/02 0700 In: 1920 [P.O.:1320; I.V.:600] Out: 2200 [Urine:2200] Intake/Output this shift: Total I/O In: -  Out: 500 [Urine:500]  General appearance: alert, cooperative and no distress GI: Nondistended. Mild diffuse tenderness. Incisions clean and dry.   Lab Results:   Recent Labs  04/10/17 0613 04/11/17 0457  WBC 7.7 8.1  HGB 13.0 14.2  HCT 39.2 42.1  PLT 160 174   BMET  Recent Labs  04/10/17 0613 04/11/17 0457  NA 141 141  K 4.0 3.9  CL 108 106  CO2 28 27  GLUCOSE 95 100*  BUN 6 5*  CREATININE 0.68 0.65  CALCIUM 9.2 9.5     Studies/Results: No results found.  Anti-infectives: Anti-infectives    Start     Dose/Rate Route Frequency Ordered Stop   04/08/17 0715  cefoTEtan in Dextrose 5% (CEFOTAN) IVPB 2 g     2 g Intravenous  Once 04/08/17 0707 04/08/17 0901   04/08/17 0606  cefoTEtan (CEFOTAN) 2 g in dextrose 5 % 50 mL IVPB  Status:  Discontinued     2 g 100 mL/hr over 30 Minutes Intravenous On call to O.R. 04/08/17 0606 04/08/17 0707      Assessment/Plan: s/p Procedure(s): XI ROBOTIC SIGMOIDECTOMY Doing better today. C. difficile negative. Continue soft diet and increase activity today. Possible discharge tomorrow.   LOS: 3 days    Aashvi Rezabek T 04/11/2017

## 2017-04-12 MED ORDER — OXYCODONE HCL 5 MG PO TABS: 5.0000 mg | ORAL_TABLET | ORAL | 0 refills | Status: DC | PRN

## 2017-04-12 NOTE — Care Management Note (Signed)
Case Management Note  Patient Details  Name: MADDYSON KEIL MRN: [id number] Date of Birth: 08-07-1946  Subjective/Objective:    XI Robotic Sigmoidectomy                Action/Plan: Discharge Planning: NCM spoke to pt and sister will be there to assist at home. No NCM needs identified.   PCP Cyndi Bender MD  Expected Discharge Date:  04/12/17               Expected Discharge Plan:  Home/Self Care  In-House Referral:  NA  Discharge planning Services  CM Consult  Post Acute Care Choice:  NA Choice offered to:  Patient  DME Arranged:  N/A DME Agency:  NA  HH Arranged:  NA HH Agency:  NA  Status of Service:  Completed, signed off  If discussed at [place] of Stay Meetings, dates discussed:    Additional Comments:  Erenest Rasher, RN 04/12/2017, 9:48 AM

## 2017-04-12 NOTE — Care Management Important Message (Signed)
Important Message  Patient Details  Name: Linda [name] MRN: [id number] Date of Birth: Dec 05, 1945   Medicare Important Message Given:  Yes    Erenest Rasher, RN 04/12/2017, 9:48 AM

## 2017-04-12 NOTE — Progress Notes (Signed)
Pt's vitals are WNL tolerating diet and pain is under control. Discussed discharge instructions with patient. All questions and concerns addressed. Discharged to home with prescription.

## 2017-04-12 NOTE — Discharge Instructions (Signed)
[facility] ______[facility] °LAPAROSCOPIC SURGERY: POST OP INSTRUCTIONS °Always review your discharge instruction sheet given to you by the facility where your surgery was performed. °IF YOU HAVE DISABILITY OR FAMILY LEAVE FORMS, YOU MUST BRING THEM TO THE OFFICE FOR PROCESSING.   °DO NOT GIVE THEM TO YOUR DOCTOR. ° °1. A prescription for pain medication may be given to you upon discharge.  Take your pain medication as prescribed, if needed.  If narcotic pain medicine is not needed, then you may take acetaminophen (Tylenol) or ibuprofen (Advil) as needed. °2. Take your usually prescribed medications unless otherwise directed. °3. If you need a refill on your pain medication, please contact your pharmacy.  They will contact our office to request authorization. Prescriptions will not be filled after 5pm or on week-ends. °4. You should follow a light diet the first few days after arrival home, such as soup and crackers, etc.  Be sure to include lots of fluids daily. °5. Most patients will experience some swelling and bruising in the area of the incisions.  Ice packs will help.  Swelling and bruising can take several days to resolve.  °6. It is common to experience some constipation if taking pain medication after surgery.  Increasing fluid intake and taking a stool softener (such as Colace) will usually help or prevent this problem from occurring.  A mild laxative (Milk of Magnesia or Miralax) should be taken according to package instructions if there are no bowel movements after 48 hours. °7. Unless discharge instructions indicate otherwise, you may remove your bandages 24-48 hours after surgery, and you may shower at that time.  You may have steri-strips (small skin tapes) in place directly over the incision.  These strips should be left on the skin for 7-10 days.  If your surgeon used skin glue on the incision, you may shower in 24 hours.  The glue will flake off over the next 2-3 weeks.  Any sutures or  staples will be removed at the office during your follow-up visit. °8. ACTIVITIES:  You may resume regular (light) daily activities beginning the next day--such as daily self-care, walking, climbing stairs--gradually increasing activities as tolerated.  You may have sexual intercourse when it is comfortable.  Refrain from any heavy lifting or straining until approved by your doctor. °a. You may drive when you are no longer taking prescription pain medication, you can comfortably wear a seatbelt, and you can safely maneuver your car and apply brakes. °b. RETURN TO WORK:  __________________________________________________________ °9. You should see your doctor in the office for a follow-up appointment approximately 2-3 weeks after your surgery.  Make sure that you call for this appointment within a day or two after you arrive home to insure a convenient appointment time. °10. OTHER INSTRUCTIONS: __________________________________________________________________________________________________________________________ __________________________________________________________________________________________________________________________ °WHEN TO CALL YOUR DOCTOR: °1. Fever over 101.0 °2. Inability to urinate °3. Continued bleeding from incision. °4. Increased pain, redness, or drainage from the incision. °5. Increasing abdominal pain ° °The clinic staff is available to answer your questions during regular business hours.  Please don’t hesitate to call and ask to speak to one of the nurses for clinical concerns.  If you have a medical emergency, go to the nearest emergency room or call 911.  A surgeon from [facility] is always on call at the hospital. °[address], [address], [place], [place]  [address] ? [address] [address], [place], [place]   [address] °[phone] ? [phone] ? FAX [phone] °Web site:   [email or web address] °

## 2017-04-12 NOTE — Progress Notes (Signed)
Patient ID: Linda [name], female   DOB: 23-Aug-1946, 71 y.o.   MRN: [id number] 4 Days Post-Op   Subjective: Feels much better today. Tolerating soft diet. Had a relatively normal bowel movement. Denies pain or nausea. Feels ready to go home.  Objective: Vital signs in last 24 hours: Temp:  [97.4 F (36.3 C)-98.4 F (36.9 C)] 97.4 F (36.3 C) (06/03 0535) Pulse Rate:  [91-98] 91 (06/03 0535) Resp:  [17-18] 17 (06/03 0535) BP: (123-161)/(70-89) 123/70 (06/03 0535) SpO2:  [93 %-98 %] 93 % (06/03 0535) Last BM Date: 04/11/17  Intake/Output from previous day: 06/02 0701 - 06/03 0700 In: 7711 [P.O.:600; I.V.:595] Out: 700 [Urine:700] Intake/Output this shift: No intake/output data recorded.  General appearance: alert, cooperative and no distress GI: normal findings: soft, non-tender and Nondistended Incision/Wound: Clean and dry without erythema or drainage  Lab Results:   Recent Labs  04/10/17 0613 04/11/17 0457  WBC 7.7 8.1  HGB 13.0 14.2  HCT 39.2 42.1  PLT 160 174   BMET  Recent Labs  04/10/17 0613 04/11/17 0457  NA 141 141  K 4.0 3.9  CL 108 106  CO2 28 27  GLUCOSE 95 100*  BUN 6 5*  CREATININE 0.68 0.65  CALCIUM 9.2 9.5     Studies/Results: No results found.  Anti-infectives: Anti-infectives    Start     Dose/Rate Route Frequency Ordered Stop   04/08/17 0715  cefoTEtan in Dextrose 5% (CEFOTAN) IVPB 2 g     2 g Intravenous  Once 04/08/17 0707 04/08/17 0901   04/08/17 0606  cefoTEtan (CEFOTAN) 2 g in dextrose 5 % 50 mL IVPB  Status:  Discontinued     2 g 100 mL/hr over 30 Minutes Intravenous On call to O.R. 04/08/17 0606 04/08/17 0707      Assessment/Plan: s/p Procedure(s): XI ROBOTIC SIGMOIDECTOMY Doing well. Okay for discharge today.   LOS: 4 days    Tamora Huneke T 04/12/2017

## 2017-04-14 NOTE — Discharge Summary (Addendum)
Patient ID: Linda [name] [id number] 70 y.o. [birth year]  04/08/2017  Discharge date and time: 04/12/2017 11:23 AM  Admitting Physician: Rosario Adie  Discharge Physician: Rosario Adie.  Admission Diagnoses: diverticular disease  Discharge Diagnoses: same  Operations: Procedure(s): XI ROBOTIC SIGMOIDECTOMY    Discharged Condition: good    Hospital Course: Patient was admitted after surgery.  Her diet was advanced as tolerated.  She was felt to be in stable condition for discharge by POD 4.   Consults: None  Significant Diagnostic Studies: labs  Treatments: IV hydration, analgesia: acetaminophen w/ codeine and surgery: see above  Disposition: Home

## 2017-05-21 ENCOUNTER — Emergency Department (HOSPITAL_COMMUNITY)
Admission: EM | Admit: 2017-05-21 | Discharge: 2017-05-21 | Disposition: A | Discharge status: Home / Self Care | Payer: Medicare Other | Attending: Emergency Medicine | Admitting: Emergency Medicine

## 2017-05-21 ENCOUNTER — Encounter (HOSPITAL_COMMUNITY): Payer: Self-pay | Admitting: Emergency Medicine

## 2017-05-21 ENCOUNTER — Emergency Department (HOSPITAL_COMMUNITY): Payer: Medicare Other

## 2017-05-21 DIAGNOSIS — M722 Plantar fascial fibromatosis: Secondary | ICD-10-CM | POA: Diagnosis not present

## 2017-05-21 DIAGNOSIS — Y939 Activity, unspecified: Secondary | ICD-10-CM | POA: Insufficient documentation

## 2017-05-21 DIAGNOSIS — Y999 Unspecified external cause status: Secondary | ICD-10-CM | POA: Diagnosis not present

## 2017-05-21 DIAGNOSIS — Y929 Unspecified place or not applicable: Secondary | ICD-10-CM | POA: Diagnosis not present

## 2017-05-21 DIAGNOSIS — Z96612 Presence of left artificial shoulder joint: Secondary | ICD-10-CM | POA: Diagnosis not present

## 2017-05-21 DIAGNOSIS — Z79899 Other long term (current) drug therapy: Secondary | ICD-10-CM | POA: Insufficient documentation

## 2017-05-21 DIAGNOSIS — S99912A Unspecified injury of left ankle, initial encounter: Secondary | ICD-10-CM | POA: Diagnosis present

## 2017-05-21 DIAGNOSIS — X58XXXA Exposure to other specified factors, initial encounter: Secondary | ICD-10-CM | POA: Diagnosis not present

## 2017-05-21 DIAGNOSIS — S93402A Sprain of unspecified ligament of left ankle, initial encounter: Secondary | ICD-10-CM

## 2017-05-21 DIAGNOSIS — Z87891 Personal history of nicotine dependence: Secondary | ICD-10-CM | POA: Diagnosis not present

## 2017-05-21 MED ORDER — METHYLPREDNISOLONE 4 MG PO TBPK: ORAL_TABLET | ORAL | 0 refills | Status: DC

## 2017-05-21 MED ORDER — HYDROCODONE-ACETAMINOPHEN 5-325 MG PO TABS
2.0000 | ORAL_TABLET | Freq: Once | ORAL | Status: AC
  Administered 2017-05-21: 2 via ORAL
  Filled 2017-05-21: qty 2

## 2017-05-21 MED ORDER — HYDROCODONE-ACETAMINOPHEN 5-325 MG PO TABS: 1.0000 | ORAL_TABLET | Freq: Four times a day (QID) | ORAL | 0 refills | Status: DC | PRN

## 2017-05-21 NOTE — ED Provider Notes (Signed)
[facility] DEPT Provider Note   CSN: [id number] Arrival date & time: 05/21/17  0932     History   Chief Complaint Chief Complaint  Patient presents with  . Ankle Pain  . Foot Pain    HPI Linda [name] is a 71 y.o. female.  HPI   71 yo F with pMHx as below here with left foot pain. Pt states her pain began gradually 2-3 days ago with aching, throbbing pain along the bottom of her foot. Denies any trauma but has been walking more than usual. Over the past 2 days, she has had progressively worsening aching, throbbing, left foot pain that is present immediately with WB and relieves with rest. No alleviating factors beyond rest. Pain also mildly worse with plantarflexion. No weakness or numbness. No other med complaints.  Past Medical History:  Diagnosis Date  . Cholelithiasis 09/25/2016  . Complication of anesthesia    was told by anesthesia in 1970's to watch her heart during surgery following a knee surgery; but ha shad no problems with surgeries afterwards  . Diverticulitis   . Kidney stone   . Kidney stones     Patient Active Problem List   Diagnosis Date Noted  . Diverticular disease s/p robotic sigmoid colectomy 04/08/2017 04/08/2017  . Kidney stone 09/25/2016  . Diverticulosis of colon 09/25/2016  . Hydronephrosis of left kidney 09/25/2016  . Cholelithiasis 09/25/2016  . Atherosclerosis of abdominal aorta ([place]) 09/25/2016  . Renal colic on left side 41/74/0814    Past Surgical History:  Procedure Laterality Date  . ABDOMINAL HYSTERECTOMY    . CESAREAN SECTION    . HEMORRHOID SURGERY    . KNEE SURGERY     x3  . lithotripsy   09/2016  . TOTAL SHOULDER REPLACEMENT Left 07/13/2010    OB History    No data available       Home Medications    Prior to Admission medications   Medication Sig Start Date End Date Taking? Authorizing Provider  acetaminophen (TYLENOL) 500 MG tablet Take 250 mg by mouth daily as needed for mild pain or headache (0.5  tablet).    [provider]  clindamycin (CLEOCIN) 300 MG capsule Take 300 mg by mouth once.    [provider]  HYDROcodone-acetaminophen (NORCO/VICODIN) 5-325 MG tablet Take 1-2 tablets by mouth every 6 (six) hours as needed for moderate pain or severe pain. 05/21/17   Duffy Bruce, MD  methylPREDNISolone (MEDROL DOSEPAK) 4 MG TBPK tablet Take as directed on package 05/21/17   Duffy Bruce, MD  Multiple Vitamins-Minerals (CENTRUM ADULTS PO) Take 1 tablet by mouth daily.     [provider]  OMEGA-3 KRILL OIL PO Take 1 tablet by mouth daily.    [provider]  oxyCODONE (OXY IR/ROXICODONE) 5 MG immediate release tablet Take 1-2 tablets (5-10 mg total) by mouth every 4 (four) hours as needed for moderate pain, severe pain or breakthrough pain. 04/12/17   Excell Seltzer, MD    Family History Family History  Problem Relation Age of Onset  . Nephrolithiasis Other   . Heart disease Father   . Colon cancer Neg Hx     Social History Social History  Substance Use Topics  . Smoking status: Former Smoker    Years: 7.00  . Smokeless tobacco: Never Used  . Alcohol use No     Allergies   Ciprofloxacin; Codeine; Naproxen; Nitrofurantoin monohyd macro; and Penicillins   Review of Systems Review of Systems  HENT: Negative  for postnasal drip.   Musculoskeletal: Positive for arthralgias and gait problem.  All other systems reviewed and are negative.    Physical Exam Updated Vital Signs BP (!) 152/91 (BP Location: Right Arm)   Pulse 76   Temp 98.1 F (36.7 C) (Oral)   Resp 18   Ht 5\' 2"  (1.575 m)   Wt 71.2 kg (157 lb)   SpO2 92%   BMI 28.72 kg/m   Physical Exam  Constitutional: She is oriented to person, place, and time. She appears well-developed and well-nourished. No distress.  HENT:  Head: Normocephalic and atraumatic.  Eyes: Conjunctivae are normal.  Neck: Neck supple.  Cardiovascular: Normal rate, regular rhythm and normal heart  sounds.   Pulmonary/Chest: Effort normal. No respiratory distress. She has no wheezes.  Abdominal: She exhibits no distension.  Musculoskeletal: She exhibits no edema.  Neurological: She is alert and oriented to person, place, and time. She exhibits normal muscle tone.  Skin: Skin is warm. Capillary refill takes less than 2 seconds. No rash noted.  Nursing note and vitals reviewed.   LOWER EXTREMITY EXAM: LEFT  INSPECTION & PALPATION: Moderate TTP over plantar aspect of foot just distal to heel. No open wounds. No deformity. No bruising/TTP.  SENSORY: sensation is intact to light touch in:  Superficial peroneal nerve distribution (over dorsum of foot) Deep peroneal nerve distribution (over first dorsal web space) Sural nerve distribution (over lateral aspect 5th metatarsal) Saphenous nerve distribution (over medial instep)  MOTOR:  + Motor EHL (great toe dorsiflexion) + FHL (great toe plantar flexion)  + TA (ankle dorsiflexion)  + GSC (ankle plantar flexion)  VASCULAR: 2+ dorsalis pedis and posterior tibialis pulses Capillary refill < 2 sec, toes warm and well-perfused  COMPARTMENTS: Soft, warm, well-perfused No pain with passive extension No parethesias    ED Treatments / Results  Labs (all labs ordered are listed, but only abnormal results are displayed) Labs Reviewed - No data to display  EKG  EKG Interpretation None       Radiology Dg Ankle Complete Left  Result Date: 05/21/2017 CLINICAL DATA:  71 year old with progressively worsening left foot and ankle pain over the past 2 days. No known injuries. EXAM: LEFT ANKLE COMPLETE - 3+ VIEW COMPARISON:  None. FINDINGS: No evidence of acute fracture. Ankle mortise intact with well-preserved joint space. Well-preserved bone mineral density for age. No intrinsic osseous abnormalities. No visible joint effusion. IMPRESSION: Normal examination. Electronically Signed   By: Evangeline Dakin M.D.   On: 05/21/2017 10:16    Dg Foot Complete Left  Result Date: 05/21/2017 CLINICAL DATA:  71 year old with progressively worsening left foot and ankle pain over the past 2 days. No known injuries. EXAM: LEFT FOOT - COMPLETE 3+ VIEW COMPARISON:  01/16/2012. FINDINGS: No evidence of acute fracture or dislocation. Remote fracture involving the base of the fifth metatarsal with normal healing. Well-preserved joint spaces. Well-preserved bone mineral density for age. IMPRESSION: No acute osseous abnormality. Electronically Signed   By: Evangeline Dakin M.D.   On: 05/21/2017 10:20    Procedures Procedures (including critical care time)  Medications Ordered in ED Medications  HYDROcodone-acetaminophen (NORCO/VICODIN) 5-325 MG per tablet 2 tablet (2 tablets Oral Given 05/21/17 1205)     Initial Impression / Assessment and Plan / ED Course  I have reviewed the triage vital signs and the nursing notes.  Pertinent labs & imaging results that were available during my care of the patient were reviewed by me and considered in my medical decision  making (see chart for details).    71 yo F with PMHx as above here with atraumatic left plantar foot pain. Exam is c/w plantar fasciitis versus sprain. No signs of fx or pathologic bone lesion on plain films. She has bounding, 2+ Dp/PT pulses, no h/o PVD, no smoking, and do not suspect PVD or ischemia. No signs of DVT and pani localized to plantar aspect of foot with WB only. No evidence to suggest cellulitis, septic arthritis, or infectious etiology. Will tx with steroids, tramadol PRN, and outpt follow-up.  Final Clinical Impressions(s) / ED Diagnoses   Final diagnoses:  Plantar fasciitis  Sprain of left ankle, unspecified ligament, initial encounter    New Prescriptions Discharge Medication List as of 05/21/2017 12:30 PM    START taking these medications   Details  HYDROcodone-acetaminophen (NORCO/VICODIN) 5-325 MG tablet Take 1-2 tablets by mouth every 6 (six) hours as needed  for moderate pain or severe pain., Starting Thu 05/21/2017, Print    methylPREDNISolone (MEDROL DOSEPAK) 4 MG TBPK tablet Take as directed on package, Print         Duffy Bruce, MD 05/21/17 1630

## 2017-05-21 NOTE — ED Notes (Signed)
Bed: SF68 Expected date: 05/21/17 Expected time: 2:42 AM Means of arrival:  Comments:

## 2017-05-21 NOTE — ED Triage Notes (Addendum)
Pt complaint of left ankle and foot pain worsening since Tuesday; unsure of recent injury. Swelling noted to posterior ankle.

## 2017-05-21 NOTE — ED Notes (Signed)
Discharge instructions reviewed with patient. Patient verbalizes understanding. VSS.   

## 2017-05-21 NOTE — ED Notes (Signed)
Bed: WA07 Expected date:  Expected time:  Means of arrival:  Comments: 

## 2017-07-14 IMAGING — CR DG ANKLE COMPLETE 3+V*L*
3 series · 3 of 3 positions shown · non-contrast
Comparison: None.

CLINICAL DATA: 71-year-old with progressively worsening left foot
and ankle pain over the past 2 days. No known injuries.

EXAM:
LEFT ANKLE COMPLETE - 3+ VIEW

[x ankle ap left]
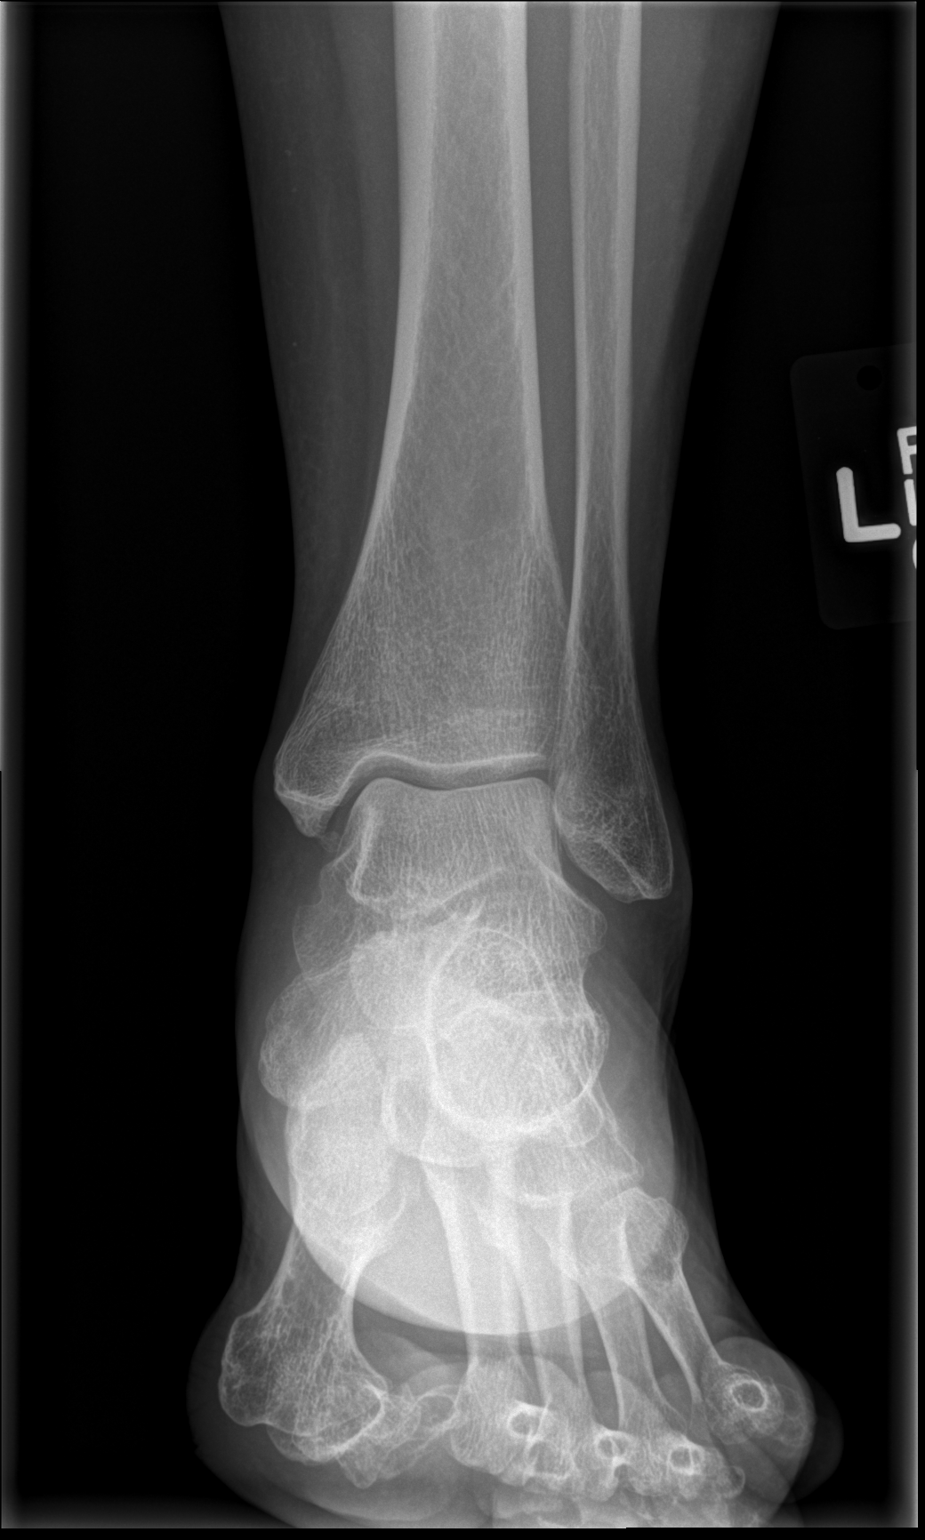

[x ankle obl left]
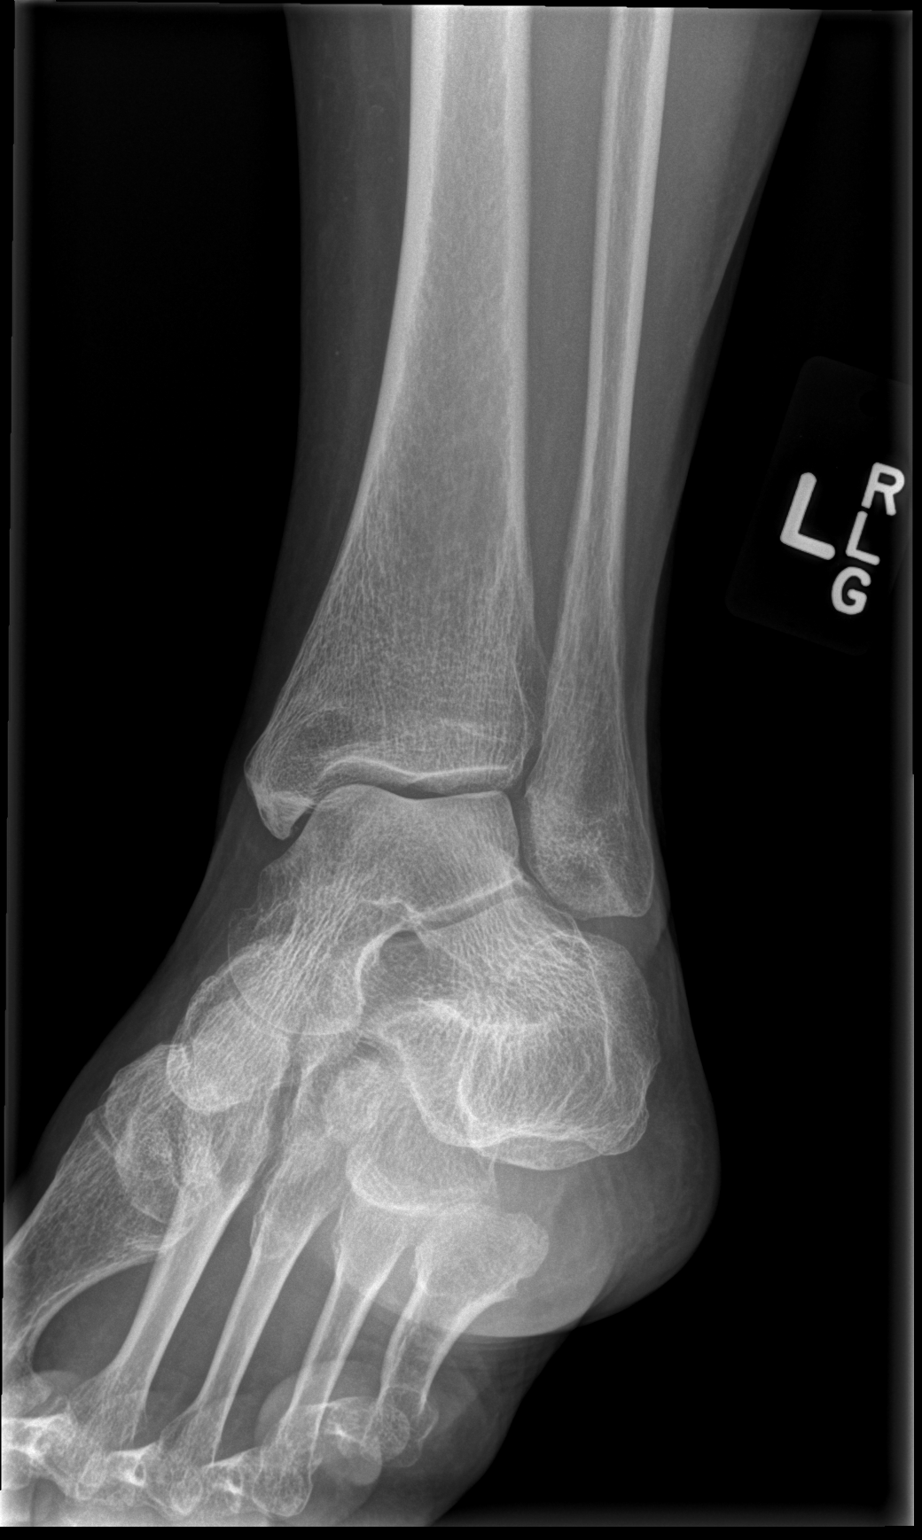

[x ankle lat left]
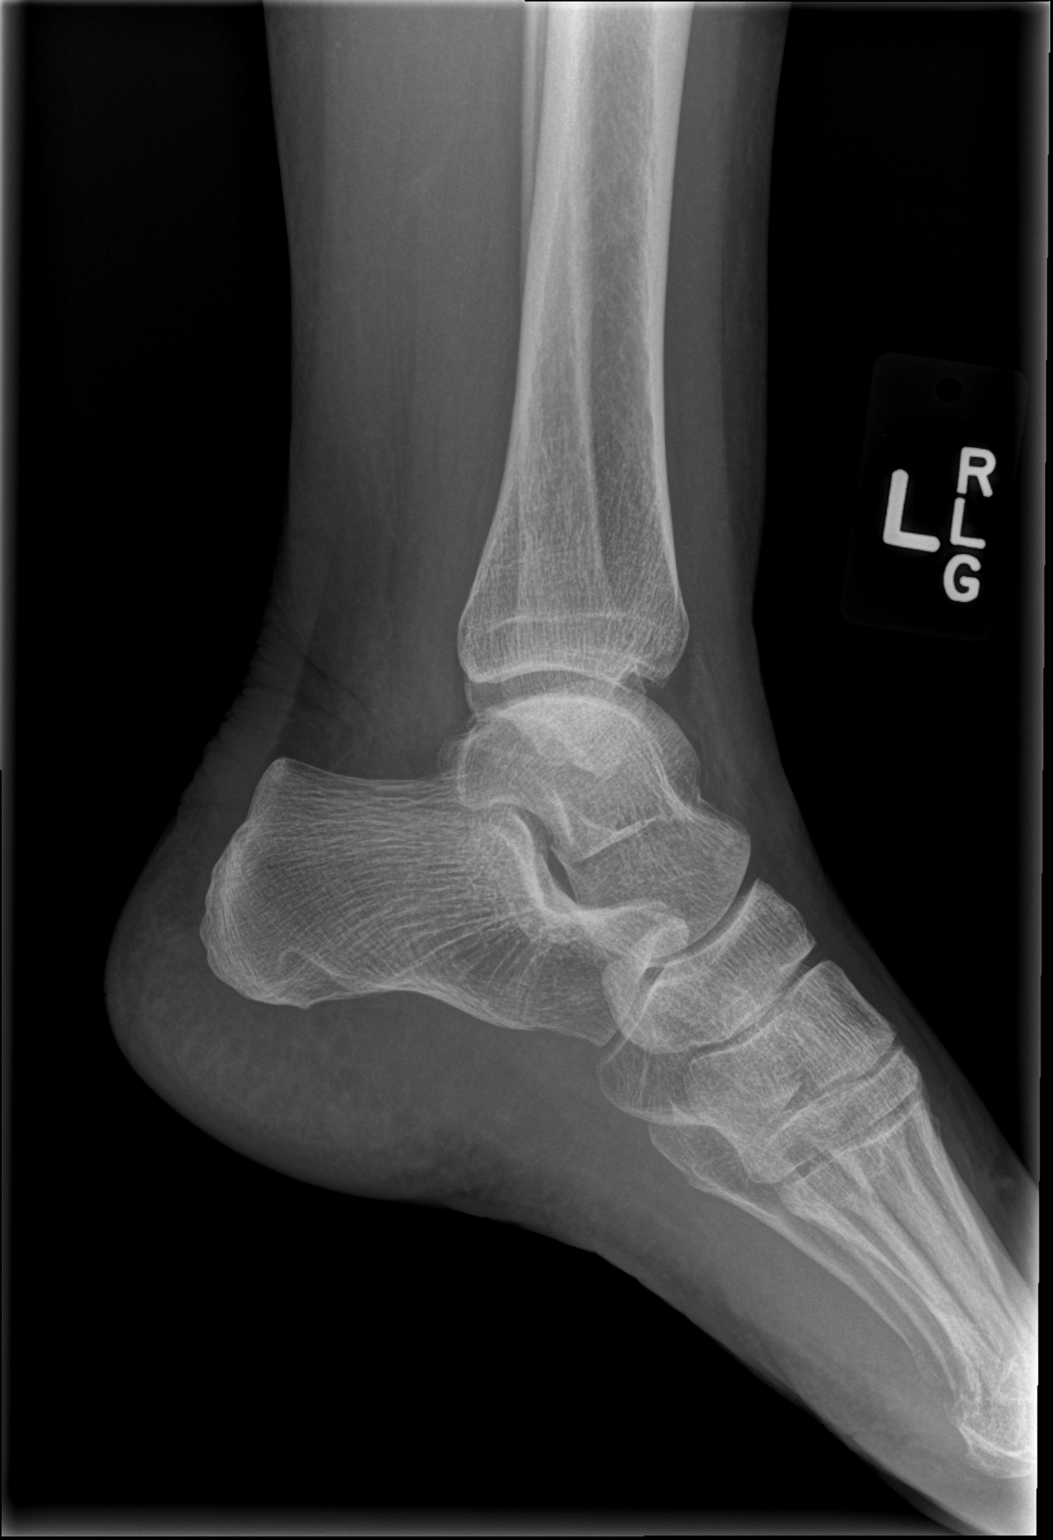

[3 of 3 positions shown; findings below may reference images not displayed]

FINDINGS: No evidence of acute fracture. Ankle mortise intact with
well-preserved joint space. Well-preserved bone mineral density for
age. No intrinsic osseous abnormalities. No visible joint effusion.
IMPRESSION: Normal examination.

## 2017-11-19 ENCOUNTER — Other Ambulatory Visit: Payer: Self-pay | Admitting: Optometry

## 2017-11-25 ENCOUNTER — Other Ambulatory Visit: Payer: Self-pay | Admitting: Optometry

## 2017-11-25 DIAGNOSIS — H5462 Unqualified visual loss, left eye, normal vision right eye: Secondary | ICD-10-CM

## 2018-02-15 ENCOUNTER — Other Ambulatory Visit: Payer: Self-pay | Admitting: Family Medicine

## 2018-02-15 DIAGNOSIS — R591 Generalized enlarged lymph nodes: Secondary | ICD-10-CM

## 2018-02-23 ENCOUNTER — Ambulatory Visit
Admission: RE | Admit: 2018-02-23 | Discharge: 2018-02-23 | Disposition: A | Discharge status: Home / Self Care | Payer: Medicare Other | Source: Ambulatory Visit | Attending: Family Medicine | Admitting: Family Medicine

## 2018-02-23 ENCOUNTER — Other Ambulatory Visit: Payer: Self-pay | Admitting: Family Medicine

## 2018-02-23 DIAGNOSIS — R7401 Elevation of levels of liver transaminase levels: Secondary | ICD-10-CM

## 2018-02-23 DIAGNOSIS — R740 Nonspecific elevation of levels of transaminase and lactic acid dehydrogenase [LDH]: Principal | ICD-10-CM

## 2018-02-23 DIAGNOSIS — R591 Generalized enlarged lymph nodes: Secondary | ICD-10-CM

## 2018-02-23 MED ORDER — IOPAMIDOL (ISOVUE-300) INJECTION 61%
75.0000 mL | Freq: Once | INTRAVENOUS | Status: AC | PRN
  Administered 2018-02-23: 75 mL via INTRAVENOUS

## 2019-01-22 ENCOUNTER — Encounter (HOSPITAL_BASED_OUTPATIENT_CLINIC_OR_DEPARTMENT_OTHER): Payer: Self-pay | Admitting: Adult Health

## 2019-01-22 ENCOUNTER — Other Ambulatory Visit: Payer: Self-pay

## 2019-01-22 ENCOUNTER — Observation Stay (HOSPITAL_BASED_OUTPATIENT_CLINIC_OR_DEPARTMENT_OTHER)
Admission: EM | Admit: 2019-01-22 | Discharge: 2019-01-23 | Disposition: A | Discharge status: Home / Self Care | Payer: Medicare Other | Attending: Urology | Admitting: Urology

## 2019-01-22 ENCOUNTER — Emergency Department (HOSPITAL_BASED_OUTPATIENT_CLINIC_OR_DEPARTMENT_OTHER): Payer: Medicare Other

## 2019-01-22 DIAGNOSIS — Z880 Allergy status to penicillin: Secondary | ICD-10-CM | POA: Diagnosis not present

## 2019-01-22 DIAGNOSIS — Z881 Allergy status to other antibiotic agents status: Secondary | ICD-10-CM | POA: Insufficient documentation

## 2019-01-22 DIAGNOSIS — Z79899 Other long term (current) drug therapy: Secondary | ICD-10-CM | POA: Diagnosis not present

## 2019-01-22 DIAGNOSIS — Z886 Allergy status to analgesic agent status: Secondary | ICD-10-CM | POA: Insufficient documentation

## 2019-01-22 DIAGNOSIS — Z96612 Presence of left artificial shoulder joint: Secondary | ICD-10-CM | POA: Diagnosis not present

## 2019-01-22 DIAGNOSIS — Z885 Allergy status to narcotic agent status: Secondary | ICD-10-CM | POA: Insufficient documentation

## 2019-01-22 DIAGNOSIS — Z87891 Personal history of nicotine dependence: Secondary | ICD-10-CM | POA: Insufficient documentation

## 2019-01-22 DIAGNOSIS — N132 Hydronephrosis with renal and ureteral calculous obstruction: Secondary | ICD-10-CM | POA: Diagnosis not present

## 2019-01-22 DIAGNOSIS — Z87442 Personal history of urinary calculi: Secondary | ICD-10-CM | POA: Insufficient documentation

## 2019-01-22 DIAGNOSIS — N23 Unspecified renal colic: Secondary | ICD-10-CM

## 2019-01-22 DIAGNOSIS — N200 Calculus of kidney: Secondary | ICD-10-CM

## 2019-01-22 DIAGNOSIS — N201 Calculus of ureter: Secondary | ICD-10-CM

## 2019-01-22 LAB — URINALYSIS, ROUTINE W REFLEX MICROSCOPIC
Glucose, UA: NEGATIVE mg/dL
Ketones, ur: NEGATIVE mg/dL
Nitrite: NEGATIVE
Protein, ur: NEGATIVE mg/dL
Specific Gravity, Urine: 1.025 (ref 1.005–1.030)
pH: 6 (ref 5.0–8.0)

## 2019-01-22 LAB — CBC
HCT: 45.1 % (ref 36.0–46.0)
Hemoglobin: 15.2 g/dL — ABNORMAL HIGH (ref 12.0–15.0)
MCH: 32 pg (ref 26.0–34.0)
MCHC: 33.7 g/dL (ref 30.0–36.0)
MCV: 94.9 fL (ref 80.0–100.0)
Platelets: 228 10*3/uL (ref 150–400)
RBC: 4.75 MIL/uL (ref 3.87–5.11)
RDW: 12.6 % (ref 11.5–15.5)
WBC: 10.1 10*3/uL (ref 4.0–10.5)
nRBC: 0 % (ref 0.0–0.2)

## 2019-01-22 LAB — BASIC METABOLIC PANEL
Anion gap: 10 (ref 5–15)
BUN: 15 mg/dL (ref 8–23)
CO2: 19 mmol/L — ABNORMAL LOW (ref 22–32)
Calcium: 10 mg/dL (ref 8.9–10.3)
Chloride: 105 mmol/L (ref 98–111)
Creatinine, Ser: 1.25 mg/dL — ABNORMAL HIGH (ref 0.44–1.00)
GFR calc Af Amer: 50 mL/min — ABNORMAL LOW (ref >60)
GFR calc non Af Amer: 43 mL/min — ABNORMAL LOW (ref >60)
Glucose, Bld: 105 mg/dL — ABNORMAL HIGH (ref 70–99)
Potassium: 4.3 mmol/L (ref 3.5–5.1)
Sodium: 134 mmol/L — ABNORMAL LOW (ref 135–145)

## 2019-01-22 LAB — URINALYSIS, MICROSCOPIC (REFLEX)

## 2019-01-22 MED ORDER — SENNA 8.6 MG PO TABS: 1.0000 | ORAL_TABLET | Freq: Two times a day (BID) | ORAL | Status: DC

## 2019-01-22 MED ORDER — ONDANSETRON HCL 4 MG/2ML IJ SOLN
4.0000 mg | Freq: Four times a day (QID) | INTRAMUSCULAR | Status: DC | PRN
  Administered 2019-01-22 – 2019-01-23 (×3): 4 mg via INTRAVENOUS
  Filled 2019-01-22 (×3): qty 2

## 2019-01-22 MED ORDER — FENTANYL CITRATE (PF) 100 MCG/2ML IJ SOLN
50.0000 ug | INTRAMUSCULAR | Status: DC | PRN
  Administered 2019-01-22: 50 ug via INTRAVENOUS
  Filled 2019-01-22: qty 2

## 2019-01-22 MED ORDER — HYDROMORPHONE HCL 1 MG/ML IJ SOLN
0.5000 mg | Freq: Once | INTRAMUSCULAR | Status: AC
  Administered 2019-01-22: 0.5 mg via INTRAVENOUS
  Filled 2019-01-22: qty 1

## 2019-01-22 MED ORDER — SODIUM CHLORIDE 0.9 % IV SOLN
INTRAVENOUS | Status: DC | PRN
Start: 2019-01-22 — End: 2019-01-23
  Administered 2019-01-22: 500 mL via INTRAVENOUS

## 2019-01-22 MED ORDER — ONDANSETRON HCL 4 MG/2ML IJ SOLN
4.0000 mg | Freq: Once | INTRAMUSCULAR | Status: AC
  Administered 2019-01-22: 4 mg via INTRAVENOUS
  Filled 2019-01-22: qty 2

## 2019-01-22 MED ORDER — OXYCODONE HCL 5 MG PO TABS: 5.0000 mg | ORAL_TABLET | ORAL | Status: DC | PRN

## 2019-01-22 MED ORDER — ONDANSETRON HCL 4 MG/2ML IJ SOLN: 4.0000 mg | Freq: Once | INTRAMUSCULAR | Status: DC

## 2019-01-22 MED ORDER — HYDROMORPHONE HCL 1 MG/ML IJ SOLN
0.5000 mg | INTRAMUSCULAR | Status: DC | PRN
  Administered 2019-01-22 – 2019-01-23 (×4): 1 mg via INTRAVENOUS
  Filled 2019-01-22 (×4): qty 1

## 2019-01-22 MED ORDER — CEFTRIAXONE SODIUM 1 G IJ SOLR
1.0000 g | Freq: Once | INTRAMUSCULAR | Status: AC
  Administered 2019-01-22: 1 g via INTRAVENOUS
  Filled 2019-01-22: qty 10

## 2019-01-22 MED ORDER — KCL IN DEXTROSE-NACL 20-5-0.45 MEQ/L-%-% IV SOLN
INTRAVENOUS | Status: DC
  Administered 2019-01-22 – 2019-01-23 (×2): via INTRAVENOUS
  Filled 2019-01-22 (×3): qty 1000

## 2019-01-22 NOTE — ED Provider Notes (Addendum)
[facility] EMERGENCY DEPARTMENT Provider Note   CSN: [id number] Arrival date & time: 01/22/19  1213    History   Chief Complaint Chief Complaint  Patient presents with  . Nephrolithiasis    HPI Linda [name] is a 73 y.o. female.     Patient c/o acute onset left flank pain 1 week ago. Pain acute onset, constant, mod-severe, radiates towards LLQ. Was told kidney stone after urgent care visit last week - states has been taking antibiotic and ultram without relief. Denies gross hematuria or dysuria. No fever or chills. Nausea. No vomiting. Similar to prior kidney stone pain.   The history is provided by the patient.    Past Medical History:  Diagnosis Date  . Cholelithiasis 09/25/2016  . Complication of anesthesia    was told by anesthesia in 1970's to watch her heart during surgery following a knee surgery; but ha shad no problems with surgeries afterwards  . Diverticulitis   . Kidney stone   . Kidney stones     Patient Active Problem List   Diagnosis Date Noted  . Diverticular disease s/p robotic sigmoid colectomy 04/08/2017 04/08/2017  . Kidney stone 09/25/2016  . Diverticulosis of colon 09/25/2016  . Hydronephrosis of left kidney 09/25/2016  . Cholelithiasis 09/25/2016  . Atherosclerosis of abdominal aorta ([place]) 09/25/2016  . Renal colic on left side 35/57/3220    Past Surgical History:  Procedure Laterality Date  . ABDOMINAL HYSTERECTOMY    . CESAREAN SECTION    . HEMORRHOID SURGERY    . KNEE SURGERY     x3  . lithotripsy   09/2016  . TOTAL SHOULDER REPLACEMENT Left 07/13/2010     OB History   No obstetric history on file.      Home Medications    Prior to Admission medications   Medication Sig Start Date End Date Taking? Authorizing Provider  acetaminophen (TYLENOL) 500 MG tablet Take 250 mg by mouth daily as needed for mild pain or headache (0.5 tablet).    [provider]  clindamycin (CLEOCIN) 300 MG capsule Take 300 mg  by mouth once.    [provider]  HYDROcodone-acetaminophen (NORCO/VICODIN) 5-325 MG tablet Take 1-2 tablets by mouth every 6 (six) hours as needed for moderate pain or severe pain. 05/21/17   Duffy Bruce, MD  methylPREDNISolone (MEDROL DOSEPAK) 4 MG TBPK tablet Take as directed on package 05/21/17   Duffy Bruce, MD  Multiple Vitamins-Minerals (CENTRUM ADULTS PO) Take 1 tablet by mouth daily.     [provider]  OMEGA-3 KRILL OIL PO Take 1 tablet by mouth daily.    [provider]  oxyCODONE (OXY IR/ROXICODONE) 5 MG immediate release tablet Take 1-2 tablets (5-10 mg total) by mouth every 4 (four) hours as needed for moderate pain, severe pain or breakthrough pain. 04/12/17   Excell Seltzer, MD    Family History Family History  Problem Relation Age of Onset  . Nephrolithiasis Other   . Heart disease Father   . Colon cancer Neg Hx     Social History Social History   Tobacco Use  . Smoking status: Former Smoker    Years: 7.00  . Smokeless tobacco: Never Used  Substance Use Topics  . Alcohol use: No    Alcohol/week: 0.0 standard drinks  . Drug use: No     Allergies   Ciprofloxacin; Codeine; Naproxen; Nitrofurantoin monohyd macro; and Penicillins   Review of Systems Review of Systems  Constitutional: Negative for chills and fever.  HENT: Negative for sore throat.   Eyes: Negative for redness.  Respiratory: Negative for shortness of breath.   Cardiovascular: Negative for chest pain.  Gastrointestinal: Positive for abdominal pain and nausea. Negative for diarrhea and vomiting.  Endocrine: Negative for polyuria.  Genitourinary: Positive for flank pain. Negative for dysuria.  Musculoskeletal: Negative for neck pain.  Skin: Negative for rash.  Neurological: Negative for headaches.  Hematological: Does not bruise/bleed easily.  Psychiatric/Behavioral: Negative for confusion.     Physical Exam Updated Vital Signs BP (!) 141/116 (BP  Location: Right Arm)   Pulse 86   Temp 98.1 F (36.7 C) (Oral)   Resp 20   Ht 1.575 m (5\' 2" )   Wt 76.2 kg   SpO2 98%   BMI 30.73 kg/m   Physical Exam Vitals signs and nursing note reviewed.  Constitutional:      Appearance: Normal appearance. She is well-developed.  HENT:     Head: Atraumatic.     Nose: Nose normal.     Mouth/Throat:     Mouth: Mucous membranes are moist.  Eyes:     General: No scleral icterus.    Conjunctiva/sclera: Conjunctivae normal.  Neck:     Musculoskeletal: Normal range of motion and neck supple. No neck rigidity or muscular tenderness.     Trachea: No tracheal deviation.  Cardiovascular:     Rate and Rhythm: Normal rate.     Pulses: Normal pulses.  Pulmonary:     Effort: Pulmonary effort is normal. No respiratory distress.     Breath sounds: Normal breath sounds.  Abdominal:     General: Bowel sounds are normal. There is no distension.     Palpations: Abdomen is soft. There is no mass.     Tenderness: There is no abdominal tenderness. There is no guarding or rebound.     Hernia: No hernia is present.  Genitourinary:    Comments: No cva tenderness.  Musculoskeletal:        General: No swelling.  Skin:    General: Skin is warm and dry.     Findings: No rash.     Comments: No rash/shingles in area of pain.   Neurological:     Mental Status: She is alert.     Comments: Alert, speech normal.   Psychiatric:        Mood and Affect: Mood normal.      ED Treatments / Results  Labs (all labs ordered are listed, but only abnormal results are displayed) Results for orders placed or performed during the hospital encounter of 01/22/19  Urinalysis, Routine w reflex microscopic  Result Value Ref Range   Color, Urine YELLOW YELLOW   APPearance HAZY (A) CLEAR   Specific Gravity, Urine 1.025 1.005 - 1.030   pH 6.0 5.0 - 8.0   Glucose, UA NEGATIVE NEGATIVE mg/dL   Hgb urine dipstick LARGE (A) NEGATIVE   Bilirubin Urine SMALL (A) NEGATIVE    Ketones, ur NEGATIVE NEGATIVE mg/dL   Protein, ur NEGATIVE NEGATIVE mg/dL   Nitrite NEGATIVE NEGATIVE   Leukocytes,Ua MODERATE (A) NEGATIVE  Urinalysis, Microscopic (reflex)  Result Value Ref Range   RBC / HPF 21-50 0 - 5 RBC/hpf   WBC, UA 21-50 0 - 5 WBC/hpf   Bacteria, UA FEW (A) NONE SEEN   Squamous Epithelial / LPF 0-5 0 - 5   Mucus PRESENT   CBC  Result Value Ref Range   WBC 10.1 4.0 - 10.5 K/uL   RBC 4.75 3.87 -  5.11 MIL/uL   Hemoglobin 15.2 (H) 12.0 - 15.0 g/dL   HCT 45.1 36.0 - 46.0 %   MCV 94.9 80.0 - 100.0 fL   MCH 32.0 26.0 - 34.0 pg   MCHC 33.7 30.0 - 36.0 g/dL   RDW 12.6 11.5 - 15.5 %   Platelets 228 150 - 400 K/uL   nRBC 0.0 0.0 - 0.2 %  Basic metabolic panel  Result Value Ref Range   Sodium 134 (L) 135 - 145 mmol/L   Potassium 4.3 3.5 - 5.1 mmol/L   Chloride 105 98 - 111 mmol/L   CO2 19 (L) 22 - 32 mmol/L   Glucose, Bld 105 (H) 70 - 99 mg/dL   BUN 15 8 - 23 mg/dL   Creatinine, Ser 1.25 (H) 0.44 - 1.00 mg/dL   Calcium 10.0 8.9 - 10.3 mg/dL   GFR calc non Af Amer 43 (L) >60 mL/min   GFR calc Af Amer 50 (L) >60 mL/min   Anion gap 10 5 - 15   Ct Renal Stone Study  Result Date: 01/22/2019 CLINICAL DATA:  73 year old female recently diagnosed with 4 millimeter kidney stone. Left lower quadrant pain. EXAM: CT ABDOMEN AND PELVIS WITHOUT CONTRAST TECHNIQUE: Multidetector CT imaging of the abdomen and pelvis was performed following the standard protocol without IV contrast. COMPARISON:  CT Abdomen and Pelvis 12/10/2016. FINDINGS: Lower chest: Minor atelectasis. Stable mild cardiomegaly. No pericardial or pleural effusion. Small chronic hiatal hernia. Hepatobiliary: Evidence of a small gallstone on series 2, image 29. Otherwise negative noncontrast liver and gallbladder. Pancreas: Negative. Spleen: Negative. Adrenals/Urinary Tract: Normal adrenal glands. Right upper pole punctate nephrolithiasis. No right hydronephrosis or hydroureter. Asymmetrically enlarged and inflamed  left kidney with hydronephrosis and obstructing 5 x 6 millimeter calculus just distal to the left ureteropelvic junction (coronal image 57). Left hydronephrosis and proximal periureteral stranding. Additional punctate left intrarenal calculi. Beyond the obstructing calculus the left ureter has a normal appearance to the bladder. Unremarkable urinary bladder. Incidental pelvic phleboliths. Stomach/Bowel: Evidence of previous partial sigmoid resection with rectosigmoid anastomosis on series 2, image 66 with no adverse features. Mild residual left colon diverticulosis, no active inflammation. Negative transverse and right colon. Normal appendix. Negative terminal ileum. No dilated small bowel. Negative stomach aside from hiatal hernia. No free air, free fluid. Vascular/Lymphatic: Aortoiliac calcified atherosclerosis. Vascular patency is not evaluated in the absence of IV contrast. No lymphadenopathy. Reproductive: Surgically absent uterus. Diminutive and normal ovaries. Other: No pelvic free fluid. 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 No acute osseous abnormality identified. IMPRESSION: 1. Acute obstructive uropathy on the left with a 5 x 6 mm calculus just distal to the left UPJ. 2. Punctate bilateral nephrolithiasis. 3. Punctate cholelithiasis. 4. Previous partial sigmoid resection with rectosigmoid anastomosis and no adverse features. Electronically Signed   By: Genevie Ann M.D.   On: 01/22/2019 15:10    EKG None  Radiology Ct Renal Stone Study  Result Date: 01/22/2019 CLINICAL DATA:  73 year old female recently diagnosed with 4 millimeter kidney stone. Left lower quadrant pain. EXAM: CT ABDOMEN AND PELVIS WITHOUT CONTRAST TECHNIQUE: Multidetector CT imaging of the abdomen and pelvis was performed following the standard protocol without IV contrast. COMPARISON:  CT Abdomen and Pelvis 12/10/2016. FINDINGS: Lower chest: Minor atelectasis. Stable  mild cardiomegaly. No pericardial or pleural effusion. Small chronic hiatal hernia. Hepatobiliary: Evidence of a small gallstone on series 2, image 29. Otherwise negative noncontrast liver and gallbladder. Pancreas: Negative. Spleen: Negative. Adrenals/Urinary Tract: Normal  adrenal glands. Right upper pole punctate nephrolithiasis. No right hydronephrosis or hydroureter. Asymmetrically enlarged and inflamed left kidney with hydronephrosis and obstructing 5 x 6 millimeter calculus just distal to the left ureteropelvic junction (coronal image 57). Left hydronephrosis and proximal periureteral stranding. Additional punctate left intrarenal calculi. Beyond the obstructing calculus the left ureter has a normal appearance to the bladder. Unremarkable urinary bladder. Incidental pelvic phleboliths. Stomach/Bowel: Evidence of previous partial sigmoid resection with rectosigmoid anastomosis on series 2, image 66 with no adverse features. Mild residual left colon diverticulosis, no active inflammation. Negative transverse and right colon. Normal appendix. Negative terminal ileum. No dilated small bowel. Negative stomach aside from hiatal hernia. No free air, free fluid. Vascular/Lymphatic: Aortoiliac calcified atherosclerosis. Vascular patency is not evaluated in the absence of IV contrast. No lymphadenopathy. Reproductive: Surgically absent uterus. Diminutive and normal ovaries. Other: No pelvic free fluid. 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 No acute osseous abnormality identified. IMPRESSION: 1. Acute obstructive uropathy on the left with a 5 x 6 mm calculus just distal to the left UPJ. 2. Punctate bilateral nephrolithiasis. 3. Punctate cholelithiasis. 4. Previous partial sigmoid resection with rectosigmoid anastomosis and no adverse features. Electronically Signed   By: Genevie Ann M.D.   On: 01/22/2019 15:10    Procedures Procedures (including critical  care time)  Medications Ordered in ED Medications  fentaNYL (SUBLIMAZE) injection 50 mcg (50 mcg Intravenous Given 01/22/19 1315)  ondansetron (ZOFRAN) injection 4 mg (has no administration in time range)  HYDROmorphone (DILAUDID) injection 0.5 mg (has no administration in time range)  ondansetron (ZOFRAN) injection 4 mg (has no administration in time range)     Initial Impression / Assessment and Plan / ED Course  I have reviewed the triage vital signs and the nursing notes.  Pertinent labs & imaging results that were available during my care of the patient were reviewed by me and considered in my medical decision making (see chart for details).  Iv ns. Dilaudid .5 mg iv. zofran iv. UA.   CT.   Reviewed nursing notes and prior charts for additional history.   Labs reviewed - rbcs and wbcs. Given 21-50 wbc w few bacteria, will culture urine and rx rocephin.  Ct reviewed - proximal ureteral stone, 6 mm.  Additional dilaudid and zofran iv. (patient with allergy to nsaid)  Patient with persistent pain and nausea. After daily/persistent pain x 8 days, proximal stone, ?uti as well, and persistent nausea/pain currently - will consult urology.   Discussed pt with Dr Tresa Moore - he accepts in transfer to [facility], requests pt be kept NPO.  Patient agreeable to transfer to [facility].       Final Clinical Impressions(s) / ED Diagnoses   Final diagnoses:  None    ED Discharge Orders    None       Lajean Saver, MD 01/22/19 1525    Lajean Saver, MD 01/22/19 1540

## 2019-01-22 NOTE — ED Notes (Addendum)
Pt tearful, writhing with pain, states she is unable to get comfortable.

## 2019-01-22 NOTE — ED Notes (Addendum)
Pt went to UC on 03/09 and was diagnosed with 47mm kidney stone. Pt c/o LLQ pain, denies back pain, c/o N/V, denies diarrhea. Denies fever. Pt took tramadol at 1000 with no relief. Pt states she has no appetite, has not eaten today

## 2019-01-22 NOTE — ED Notes (Signed)
ED Provider at bedside. Dr Ashok Cordia

## 2019-01-22 NOTE — ED Notes (Signed)
Patient transported to CT 

## 2019-01-22 NOTE — ED Notes (Signed)
ED Provider at bedside. 

## 2019-01-22 NOTE — ED Triage Notes (Signed)
Presents with known 33mm kidney stone, She reports that she has been taking tramadol, flomax and bactrim with no relief. She is able to urinate

## 2019-01-22 NOTE — ED Notes (Signed)
Pts son and aunt going to eat and requests call if patient transferred before return. Beverley Allender - [phone]

## 2019-01-22 NOTE — H&P (Signed)
Linda [name] is an 73 y.o. female.    Chief Complaint: Recurrent Urolithiasis  HPI:   1 - Recurrent Urolithiasis -  Pre 2020 - SWL x several 01/2019 - Left 13mm proximal ureteral stone, bilatearl punctate stones on CT eval colic. UA nit negative, no fevers, Cr 1.2, WBC 10k. SSD 14cm 760HU.  2 - Medical Stone Disease -  Eval 2020 - BMP, PTH, Urate - pending; Composition - CaOx; 62 Hr Urines - pending  PMH sig for obesity, benign parital hyst, sigmoid resection for diverticular disease, ortho surgery. Her PCP is with [facility].   Today "Linda [name]" is seen for admission and mangmeent of left ureteral stone with pain that could not be controlled at referring ER.   Past Medical History:  Diagnosis Date  . Cholelithiasis 09/25/2016  . Complication of anesthesia    was told by anesthesia in 1970's to watch her heart during surgery following a knee surgery; but ha shad no problems with surgeries afterwards  . Diverticulitis   . Kidney stone   . Kidney stones     Past Surgical History:  Procedure Laterality Date  . ABDOMINAL HYSTERECTOMY    . CESAREAN SECTION    . HEMORRHOID SURGERY    . KNEE SURGERY     x3  . lithotripsy   09/2016  . TOTAL SHOULDER REPLACEMENT Left 07/13/2010    Family History  Problem Relation Age of Onset  . Nephrolithiasis Other   . Heart disease Father   . Colon cancer Neg Hx    Social History:  reports that she has quit smoking. She quit after 7.00 years of use. She has never used smokeless tobacco. She reports that she does not drink alcohol or use drugs.  Allergies:  Allergies  Allergen Reactions  . Ciprofloxacin Itching  . Codeine Nausea Only  . Naproxen Hives and Itching  . Nitrofurantoin Monohyd Macro Rash  . Penicillins Rash    Has patient had a PCN reaction causing immediate rash, facial/tongue/throat swelling, SOB or lightheadedness with hypotension: No Has patient had a PCN reaction causing severe rash involving mucus membranes or  skin necrosis: Yes Has patient had a PCN reaction that required hospitalization No Has patient had a PCN reaction occurring within the last 10 years: No If all of the above answers are "NO", then may proceed with Cephalosporin use.     (Not in a hospital admission)   Results for orders placed or performed during the hospital encounter of 01/22/19 (from the past 48 hour(s))  Urinalysis, Routine w reflex microscopic     Status: Abnormal   Collection Time: 01/22/19 12:35 PM  Result Value Ref Range   Color, Urine YELLOW YELLOW   APPearance HAZY (A) CLEAR   Specific Gravity, Urine 1.025 1.005 - 1.030   pH 6.0 5.0 - 8.0   Glucose, UA NEGATIVE NEGATIVE mg/dL   Hgb urine dipstick LARGE (A) NEGATIVE   Bilirubin Urine SMALL (A) NEGATIVE   Ketones, ur NEGATIVE NEGATIVE mg/dL   Protein, ur NEGATIVE NEGATIVE mg/dL   Nitrite NEGATIVE NEGATIVE   Leukocytes,Ua MODERATE (A) NEGATIVE    Comment: Performed at [facility], [address] [place], [place] [address]  Urinalysis, Microscopic (reflex)     Status: Abnormal   Collection Time: 01/22/19 12:35 PM  Result Value Ref Range   RBC / HPF 21-50 0 - 5 RBC/hpf   WBC, UA 21-50 0 - 5 WBC/hpf   Bacteria, UA FEW (A) NONE SEEN  Squamous Epithelial / LPF 0-5 0 - 5   Mucus PRESENT     Comment: Performed at [facility], [address] [place], [place] [address]  CBC     Status: Abnormal   Collection Time: 01/22/19  1:48 PM  Result Value Ref Range   WBC 10.1 4.0 - 10.5 K/uL   RBC 4.75 3.87 - 5.11 MIL/uL   Hemoglobin 15.2 (H) 12.0 - 15.0 g/dL   HCT 45.1 36.0 - 46.0 %   MCV 94.9 80.0 - 100.0 fL   MCH 32.0 26.0 - 34.0 pg   MCHC 33.7 30.0 - 36.0 g/dL   RDW 12.6 11.5 - 15.5 %   Platelets 228 150 - 400 K/uL   nRBC 0.0 0.0 - 0.2 %    Comment: Performed at [facility], [address] [place], [place] [address]  Basic metabolic panel     Status: Abnormal   Collection Time: 01/22/19  1:48 PM  Result Value Ref Range    Sodium 134 (L) 135 - 145 mmol/L   Potassium 4.3 3.5 - 5.1 mmol/L   Chloride 105 98 - 111 mmol/L   CO2 19 (L) 22 - 32 mmol/L   Glucose, Bld 105 (H) 70 - 99 mg/dL   BUN 15 8 - 23 mg/dL   Creatinine, Ser 1.25 (H) 0.44 - 1.00 mg/dL   Calcium 10.0 8.9 - 10.3 mg/dL   GFR calc non Af Amer 43 (L) >60 mL/min   GFR calc Af Amer 50 (L) >60 mL/min   Anion gap 10 5 - 15    Comment: Performed at [facility], [address] [place], [place] [address]   Ct Renal Joaquim Lai Study  Result Date: 01/22/2019 CLINICAL DATA:  73 year old female recently diagnosed with 4 millimeter kidney stone. Left lower quadrant pain. EXAM: CT ABDOMEN AND PELVIS WITHOUT CONTRAST TECHNIQUE: Multidetector CT imaging of the abdomen and pelvis was performed following the standard protocol without IV contrast. COMPARISON:  CT Abdomen and Pelvis 12/10/2016. FINDINGS: Lower chest: Minor atelectasis. Stable mild cardiomegaly. No pericardial or pleural effusion. Small chronic hiatal hernia. Hepatobiliary: Evidence of a small gallstone on series 2, image 29. Otherwise negative noncontrast liver and gallbladder. Pancreas: Negative. Spleen: Negative. Adrenals/Urinary Tract: Normal adrenal glands. Right upper pole punctate nephrolithiasis. No right hydronephrosis or hydroureter. Asymmetrically enlarged and inflamed left kidney with hydronephrosis and obstructing 5 x 6 millimeter calculus just distal to the left ureteropelvic junction (coronal image 57). Left hydronephrosis and proximal periureteral stranding. Additional punctate left intrarenal calculi. Beyond the obstructing calculus the left ureter has a normal appearance to the bladder. Unremarkable urinary bladder. Incidental pelvic phleboliths. Stomach/Bowel: Evidence of previous partial sigmoid resection with rectosigmoid anastomosis on series 2, image 66 with no adverse features. Mild residual left colon diverticulosis, no active inflammation. Negative transverse and right colon.  Normal appendix. Negative terminal ileum. No dilated small bowel. Negative stomach aside from hiatal hernia. No free air, free fluid. Vascular/Lymphatic: Aortoiliac calcified atherosclerosis. Vascular patency is not evaluated in the absence of IV contrast. No lymphadenopathy. Reproductive: Surgically absent uterus. Diminutive and normal ovaries. Other: No pelvic free fluid. 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 No acute osseous abnormality identified. IMPRESSION: 1. Acute obstructive uropathy on the left with a 5 x 6 mm calculus just distal to the left UPJ. 2. Punctate bilateral nephrolithiasis. 3. Punctate cholelithiasis. 4. Previous partial sigmoid resection with rectosigmoid anastomosis and no adverse features. Electronically Signed  By: Genevie Ann M.D.   On: 01/22/2019 15:10    Review of Systems  Constitutional: Negative.  Negative for chills and fever.  HENT: Negative.   Eyes: Negative.   Respiratory: Negative.   Cardiovascular: Negative.   Gastrointestinal: Negative for nausea and vomiting.  Genitourinary: Positive for flank pain.  Skin: Negative.   Neurological: Negative.   Endo/Heme/Allergies: Negative.   Psychiatric/Behavioral: Negative.     Blood pressure (!) 141/81, pulse 72, temperature 98.1 F (36.7 C), temperature source Oral, resp. rate 18, height 5\' 2"  (1.575 m), weight 76.2 kg, SpO2 93 %. Physical Exam   Assessment/Plan  Discussed options for refracotry colic including medical management only, stent, or definitive stone management with left ureteroscopy with goal of left side stone free. She opts for ureteroscopy tomorrow as add on.  Risks, benefits, alternatives, expected peri-op course discussed.      Alexis Frock, MD 01/22/2019, 3:39 PM

## 2019-01-23 ENCOUNTER — Observation Stay (HOSPITAL_COMMUNITY): Payer: Medicare Other

## 2019-01-23 ENCOUNTER — Encounter (HOSPITAL_COMMUNITY)
Admission: EM | Disposition: A | Discharge status: Home / Self Care | Payer: Self-pay | Source: Home / Self Care | Attending: Emergency Medicine

## 2019-01-23 ENCOUNTER — Observation Stay (HOSPITAL_COMMUNITY): Payer: Medicare Other | Admitting: Anesthesiology

## 2019-01-23 ENCOUNTER — Encounter (HOSPITAL_COMMUNITY): Payer: Self-pay | Admitting: *Deleted

## 2019-01-23 DIAGNOSIS — N132 Hydronephrosis with renal and ureteral calculous obstruction: Secondary | ICD-10-CM | POA: Diagnosis not present

## 2019-01-23 DIAGNOSIS — Z87442 Personal history of urinary calculi: Secondary | ICD-10-CM | POA: Diagnosis not present

## 2019-01-23 DIAGNOSIS — Z96612 Presence of left artificial shoulder joint: Secondary | ICD-10-CM | POA: Diagnosis not present

## 2019-01-23 DIAGNOSIS — Z79899 Other long term (current) drug therapy: Secondary | ICD-10-CM | POA: Diagnosis not present

## 2019-01-23 HISTORY — PX: CYSTOSCOPY W/ URETERAL STENT PLACEMENT: SHX1429

## 2019-01-23 LAB — SURGICAL PCR SCREEN
MRSA, PCR: NEGATIVE
Staphylococcus aureus: NEGATIVE

## 2019-01-23 SURGERY — CYSTOSCOPY, WITH RETROGRADE PYELOGRAM AND URETERAL STENT INSERTION
Anesthesia: General | Site: Ureter | Laterality: Left

## 2019-01-23 MED ORDER — ONDANSETRON HCL 4 MG/2ML IJ SOLN
INTRAMUSCULAR | Status: DC | PRN
  Administered 2019-01-23: 4 mg via INTRAVENOUS

## 2019-01-23 MED ORDER — DOXYCYCLINE HYCLATE 50 MG PO CAPS: 50.0000 mg | ORAL_CAPSULE | Freq: Two times a day (BID) | ORAL | 0 refills | Status: DC

## 2019-01-23 MED ORDER — DEXAMETHASONE SODIUM PHOSPHATE 10 MG/ML IJ SOLN
INTRAMUSCULAR | Status: DC | PRN
  Administered 2019-01-23: 5 mg via INTRAVENOUS

## 2019-01-23 MED ORDER — KETOROLAC TROMETHAMINE 10 MG PO TABS: 10.0000 mg | ORAL_TABLET | Freq: Four times a day (QID) | ORAL | 0 refills | Status: DC | PRN

## 2019-01-23 MED ORDER — PROPOFOL 10 MG/ML IV BOLUS
INTRAVENOUS | Status: DC | PRN
  Administered 2019-01-23: 160 mg via INTRAVENOUS

## 2019-01-23 MED ORDER — OXYCODONE HCL 5 MG PO TABS: 5.0000 mg | ORAL_TABLET | Freq: Once | ORAL | Status: DC | PRN

## 2019-01-23 MED ORDER — FENTANYL CITRATE (PF) 100 MCG/2ML IJ SOLN
INTRAMUSCULAR | Status: DC | PRN
  Administered 2019-01-23 (×2): 100 ug via INTRAVENOUS

## 2019-01-23 MED ORDER — SCOPOLAMINE 1 MG/3DAYS TD PT72
MEDICATED_PATCH | TRANSDERMAL | Status: DC | PRN
  Administered 2019-01-23: 1 via TRANSDERMAL

## 2019-01-23 MED ORDER — 0.9 % SODIUM CHLORIDE (POUR BTL) OPTIME
TOPICAL | Status: DC | PRN
  Administered 2019-01-23: 1000 mL

## 2019-01-23 MED ORDER — OXYCODONE-ACETAMINOPHEN 5-325 MG PO TABS: 1.0000 | ORAL_TABLET | Freq: Four times a day (QID) | ORAL | 0 refills | Status: DC | PRN

## 2019-01-23 MED ORDER — ONDANSETRON HCL 4 MG/2ML IJ SOLN: 4.0000 mg | Freq: Four times a day (QID) | INTRAMUSCULAR | Status: DC | PRN

## 2019-01-23 MED ORDER — LACTATED RINGERS IV SOLN
INTRAVENOUS | Status: DC | PRN
  Administered 2019-01-23: 12:00:00 via INTRAVENOUS

## 2019-01-23 MED ORDER — PROPOFOL 10 MG/ML IV BOLUS
INTRAVENOUS | Status: AC
  Filled 2019-01-23: qty 20

## 2019-01-23 MED ORDER — IOHEXOL 300 MG/ML  SOLN
INTRAMUSCULAR | Status: DC | PRN
  Administered 2019-01-23: 50 mL

## 2019-01-23 MED ORDER — FENTANYL CITRATE (PF) 100 MCG/2ML IJ SOLN
INTRAMUSCULAR | Status: AC
  Filled 2019-01-23: qty 2

## 2019-01-23 MED ORDER — SUCCINYLCHOLINE CHLORIDE 20 MG/ML IJ SOLN
INTRAMUSCULAR | Status: DC | PRN
  Administered 2019-01-23: 120 mg via INTRAVENOUS

## 2019-01-23 MED ORDER — SODIUM CHLORIDE 0.9 % IR SOLN
Status: DC | PRN
  Administered 2019-01-23: 6000 mL

## 2019-01-23 MED ORDER — EPHEDRINE SULFATE-NACL 50-0.9 MG/10ML-% IV SOSY
PREFILLED_SYRINGE | INTRAVENOUS | Status: DC | PRN
  Administered 2019-01-23 (×2): 10 mg via INTRAVENOUS

## 2019-01-23 MED ORDER — PHENYLEPHRINE 40 MCG/ML (10ML) SYRINGE FOR IV PUSH (FOR BLOOD PRESSURE SUPPORT)
PREFILLED_SYRINGE | INTRAVENOUS | Status: DC | PRN
  Administered 2019-01-23: 80 ug via INTRAVENOUS

## 2019-01-23 MED ORDER — FENTANYL CITRATE (PF) 100 MCG/2ML IJ SOLN: 25.0000 ug | INTRAMUSCULAR | Status: DC | PRN

## 2019-01-23 MED ORDER — SCOPOLAMINE 1 MG/3DAYS TD PT72
MEDICATED_PATCH | TRANSDERMAL | Status: AC
  Filled 2019-01-23: qty 1

## 2019-01-23 MED ORDER — LIDOCAINE 2% (20 MG/ML) 5 ML SYRINGE
INTRAMUSCULAR | Status: DC | PRN
  Administered 2019-01-23: 60 mg via INTRAVENOUS

## 2019-01-23 MED ORDER — MUPIROCIN 2 % EX OINT
1.0000 "application " | TOPICAL_OINTMENT | Freq: Two times a day (BID) | CUTANEOUS | Status: DC
  Administered 2019-01-23: 1 via NASAL
  Filled 2019-01-23: qty 22

## 2019-01-23 MED ORDER — OXYCODONE HCL 5 MG/5ML PO SOLN: 5.0000 mg | Freq: Once | ORAL | Status: DC | PRN

## 2019-01-23 MED ORDER — ONDANSETRON HCL 4 MG PO TABS: 4.0000 mg | ORAL_TABLET | Freq: Three times a day (TID) | ORAL | 0 refills | Status: DC | PRN

## 2019-01-23 SURGICAL SUPPLY — 19 items
BAG URO CATCHER STRL LF (MISCELLANEOUS) ×2 IMPLANT
BASKET LASER NITINOL 1.9FR (BASKET) ×1 IMPLANT
BASKET ZERO TIP NITINOL 2.4FR (BASKET) IMPLANT
BSKT STON RTRVL 120 1.9FR (BASKET) ×1
BSKT STON RTRVL ZERO TP 2.4FR (BASKET)
CATH INTERMIT  6FR 70CM (CATHETERS) IMPLANT
CLOTH BEACON ORANGE TIMEOUT ST (SAFETY) ×2 IMPLANT
COVER WAND RF STERILE (DRAPES) IMPLANT
FIBER LASER TRAC TIP (UROLOGICAL SUPPLIES) ×1 IMPLANT
GLOVE BIOGEL M STRL SZ7.5 (GLOVE) ×2 IMPLANT
GOWN STRL REUS W/TWL LRG LVL3 (GOWN DISPOSABLE) ×4 IMPLANT
GUIDEWIRE ANG ZIPWIRE 038X150 (WIRE) ×2 IMPLANT
GUIDEWIRE STR DUAL SENSOR (WIRE) ×1 IMPLANT
KIT TURNOVER KIT A (KITS) IMPLANT
MANIFOLD NEPTUNE II (INSTRUMENTS) ×2 IMPLANT
PACK CYSTO (CUSTOM PROCEDURE TRAY) ×2 IMPLANT
SHEATH URETERAL 12FRX28CM (UROLOGICAL SUPPLIES) ×1 IMPLANT
STENT POLARIS 5FRX22 (STENTS) ×1 IMPLANT
TUBING CONNECTING 10 (TUBING) ×2 IMPLANT

## 2019-01-23 NOTE — Transfer of Care (Signed)
Immediate Anesthesia Transfer of Care Note  Patient: Linda [name]  Procedure(s) Performed: CYSTOSCOPY WITH RETROGRADE PYELOGRAM/URETERAL STENT PLACEMENT,LASER uteroscopy (Left Ureter)  Patient Location: PACU  Anesthesia Type:General  Level of Consciousness: sedated, patient cooperative and responds to stimulation  Airway & Oxygen Therapy: Patient Spontanous Breathing and Patient connected to face mask oxygen  Post-op Assessment: Report given to RN and Post -op Vital signs reviewed and stable  Post vital signs: Reviewed and stable  Last Vitals:  Vitals Value Taken Time  BP    Temp    Pulse 85 01/23/2019 12:19 PM  Resp 8 01/23/2019 12:19 PM  SpO2 94 % 01/23/2019 12:19 PM  Vitals shown include unvalidated device data.  Last Pain:  Vitals:   01/23/19 0921  TempSrc:   PainSc: 6       Patients Stated Pain Goal: 4 (34/03/52 4818)  Complications: No apparent anesthesia complications

## 2019-01-23 NOTE — Progress Notes (Signed)
Day of Surgery   Subjective/Chief Complaint:  1 - Recurrent Urolithiasis -  Pre 2020 - SWL x several 01/2019 - Left 34mm proximal ureteral stone, bilatearl punctate stones on CT eval colic. UA nit negative, no fevers, Cr 1.2, WBC 10k. SSD 14cm 760HU.  2 - Medical Stone Disease -  Eval 2020 - BMP, PTH, Urate - pending; Composition - CaOx; 24 Hr Urines - pending  Today "Linda [name]" is stable. Colic symptoms improved on IV meds. Scheduled for ureteroscopy later today.    Objective: Vital signs in last 24 hours: Temp:  [97.7 F (36.5 C)-98.1 F (36.7 C)] 97.9 F (36.6 C) (03/15 0626) Pulse Rate:  [58-86] 58 (03/15 0626) Resp:  [8-20] 8 (03/15 0626) BP: (134-160)/(65-116) 142/65 (03/15 0626) SpO2:  [93 %-100 %] 95 % (03/15 0626) Weight:  [76.2 kg] 76.2 kg (03/14 1220) Last BM Date: 01/21/19  Intake/Output from previous day: 03/14 0701 - 03/15 0700 In: 693.3 [I.V.:593.3; IV Piggyback:100] Out: 400 [Urine:400] Intake/Output this shift: No intake/output data recorded.  NAD, AOx3 Non-labored breathing on room air Reg heart rate Obese abdomen with mild left CVAT NO c/c/e  Lab Results:  Recent Labs    01/22/19 1348  WBC 10.1  HGB 15.2*  HCT 45.1  PLT 228   BMET Recent Labs    01/22/19 1348  NA 134*  K 4.3  CL 105  CO2 19*  GLUCOSE 105*  BUN 15  CREATININE 1.25*  CALCIUM 10.0   PT/INR No results for input(s): LABPROT, INR in the last 72 hours. ABG No results for input(s): PHART, HCO3 in the last 72 hours.  Invalid input(s): PCO2, PO2  Studies/Results: Ct Renal Stone Study  Result Date: 01/22/2019 CLINICAL DATA:  73 year old female recently diagnosed with 4 millimeter kidney stone. Left lower quadrant pain. EXAM: CT ABDOMEN AND PELVIS WITHOUT CONTRAST TECHNIQUE: Multidetector CT imaging of the abdomen and pelvis was performed following the standard protocol without IV contrast. COMPARISON:  CT Abdomen and Pelvis 12/10/2016. FINDINGS: Lower chest: Minor  atelectasis. Stable mild cardiomegaly. No pericardial or pleural effusion. Small chronic hiatal hernia. Hepatobiliary: Evidence of a small gallstone on series 2, image 29. Otherwise negative noncontrast liver and gallbladder. Pancreas: Negative. Spleen: Negative. Adrenals/Urinary Tract: Normal adrenal glands. Right upper pole punctate nephrolithiasis. No right hydronephrosis or hydroureter. Asymmetrically enlarged and inflamed left kidney with hydronephrosis and obstructing 5 x 6 millimeter calculus just distal to the left ureteropelvic junction (coronal image 57). Left hydronephrosis and proximal periureteral stranding. Additional punctate left intrarenal calculi. Beyond the obstructing calculus the left ureter has a normal appearance to the bladder. Unremarkable urinary bladder. Incidental pelvic phleboliths. Stomach/Bowel: Evidence of previous partial sigmoid resection with rectosigmoid anastomosis on series 2, image 66 with no adverse features. Mild residual left colon diverticulosis, no active inflammation. Negative transverse and right colon. Normal appendix. Negative terminal ileum. No dilated small bowel. Negative stomach aside from hiatal hernia. No free air, free fluid. Vascular/Lymphatic: Aortoiliac calcified atherosclerosis. Vascular patency is not evaluated in the absence of IV contrast. No lymphadenopathy. Reproductive: Surgically absent uterus. Diminutive and normal ovaries. Other: No pelvic free fluid. 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 No acute osseous abnormality identified. IMPRESSION: 1. Acute obstructive uropathy on the left with a 5 x 6 mm calculus just distal to the left UPJ. 2. Punctate bilateral nephrolithiasis. 3. Punctate cholelithiasis. 4. Previous partial sigmoid resection with rectosigmoid anastomosis and no adverse features. Electronically Signed   By: Genevie Ann M.D.   On:  01/22/2019 15:10     Anti-infectives: Anti-infectives (From admission, onward)   Start     Dose/Rate Route Frequency Ordered Stop   01/22/19 1545  cefTRIAXone (ROCEPHIN) 1 g in sodium chloride 0.9 % 100 mL IVPB     1 g 200 mL/hr over 30 Minutes Intravenous  Once 01/22/19 1539 01/22/19 1627      Assessment/Plan:  Proceed as planned with left ureteroscopy with goal of stone free. Likely DC home post-op.   Linda [name] 01/23/2019

## 2019-01-23 NOTE — Anesthesia Preprocedure Evaluation (Signed)
Anesthesia Evaluation  Patient identified by MRN, date of birth, ID band Patient awake    Reviewed: Allergy & Precautions, H&P , NPO status , Patient's Chart, lab work & pertinent test results  Airway Mallampati: II   Neck ROM: full    Dental   Pulmonary former smoker,    breath sounds clear to auscultation       Cardiovascular negative cardio ROS   Rhythm:regular Rate:Normal     Neuro/Psych    GI/Hepatic   Endo/Other  obese  Renal/GU Renal diseasestones     Musculoskeletal   Abdominal   Peds  Hematology   Anesthesia Other Findings   Reproductive/Obstetrics                             Anesthesia Physical Anesthesia Plan  ASA: II  Anesthesia Plan: General   Post-op Pain Management:    Induction: Intravenous  PONV Risk Score and Plan: 3 and Ondansetron, Dexamethasone and Treatment may vary due to age or medical condition  Airway Management Planned: LMA  Additional Equipment:   Intra-op Plan:   Post-operative Plan:   Informed Consent: I have reviewed the patients History and Physical, chart, labs and discussed the procedure including the risks, benefits and alternatives for the proposed anesthesia with the patient or authorized representative who has indicated his/her understanding and acceptance.       Plan Discussed with: CRNA, Anesthesiologist and Surgeon  Anesthesia Plan Comments:         Anesthesia Quick Evaluation

## 2019-01-23 NOTE — Anesthesia Procedure Notes (Signed)
Procedure Name: Intubation Performed by: Gean Maidens, CRNA Pre-anesthesia Checklist: Patient identified, Emergency Drugs available, Suction available, Patient being monitored and Timeout performed Patient Re-evaluated:Patient Re-evaluated prior to induction Oxygen Delivery Method: Circle system utilized Preoxygenation: Pre-oxygenation with 100% oxygen Induction Type: Rapid sequence and IV induction Laryngoscope Size: Mac and 4 Grade View: Grade I Tube type: Oral Tube size: 7.0 mm Number of attempts: 1 Airway Equipment and Method: Stylet Placement Confirmation: ETT inserted through vocal cords under direct vision,  positive ETCO2 and breath sounds checked- equal and bilateral Secured at: 21 cm Tube secured with: Tape Dental Injury: Teeth and Oropharynx as per pre-operative assessment

## 2019-01-23 NOTE — Plan of Care (Signed)
  Problem: Education: Goal: Knowledge of General Education information will improve Description Including pain rating scale, medication(s)/side effects and non-pharmacologic comfort measures Outcome: Progressing   Problem: Health Behavior/Discharge Planning: Goal: Ability to manage health-related needs will improve Outcome: Progressing   Problem: Clinical Measurements: Goal: Ability to maintain clinical measurements within normal limits will improve Outcome: Progressing Goal: Will remain free from infection Outcome: Progressing Goal: Diagnostic test results will improve Outcome: Progressing Goal: Respiratory complications will improve Outcome: Progressing Goal: Cardiovascular complication will be avoided Outcome: Progressing   Problem: Nutrition: Goal: Adequate nutrition will be maintained Outcome: Progressing   Problem: Coping: Goal: Level of anxiety will decrease Outcome: Progressing   Problem: Elimination: Goal: Will not experience complications related to bowel motility Outcome: Progressing Goal: Will not experience complications related to urinary retention Outcome: Progressing   Problem: Pain Managment: Goal: General experience of comfort will improve Outcome: Progressing   Problem: Safety: Goal: Ability to remain free from injury will improve Outcome: Progressing   Problem: Skin Integrity: Goal: Risk for impaired skin integrity will decrease Outcome: Progressing   Problem: Spiritual Needs Goal: Ability to function at adequate level Outcome: Progressing

## 2019-01-23 NOTE — Discharge Instructions (Signed)
Dietary Guidelines to Help Prevent Kidney Stones Kidney stones are deposits of minerals and salts that form inside your kidneys. Your risk of developing kidney stones may be greater depending on your diet, your lifestyle, the medicines you take, and whether you have certain medical conditions. Most people can reduce their chances of developing kidney stones by following the instructions below. Depending on your overall health and the type of kidney stones you tend to develop, your dietitian may give you more specific instructions. What are tips for following this plan? Reading food labels  Choose foods with "no salt added" or "low-salt" labels. Limit your sodium intake to less than 1500 mg per day.  Choose foods with calcium for each meal and snack. Try to eat about 300 mg of calcium at each meal. Foods that contain 200-500 mg of calcium per serving include: ? 8 oz (237 ml) of milk, fortified nondairy milk, and fortified fruit juice. ? 8 oz (237 ml) of kefir, yogurt, and soy yogurt. ? 4 oz (118 ml) of tofu. ? 1 oz of cheese. ? 1 cup (300 g) of dried figs. ? 1 cup (91 g) of cooked broccoli. ? 1-3 oz can of sardines or mackerel.  Most people need 1000 to 1500 mg of calcium each day. Talk to your dietitian about how much calcium is recommended for you. Shopping  Buy plenty of fresh fruits and vegetables. Most people do not need to avoid fruits and vegetables, even if they contain nutrients that may contribute to kidney stones.  When shopping for convenience foods, choose: ? Whole pieces of fruit. ? Premade salads with dressing on the side. ? Low-fat fruit and yogurt smoothies.  Avoid buying frozen meals or prepared deli foods.  Look for foods with live cultures, such as yogurt and kefir. Cooking  Do not add salt to food when cooking. Place a salt shaker on the table and allow each person to add his or her own salt to taste.  Use vegetable protein, such as beans, textured vegetable  protein (TVP), or tofu instead of meat in pasta, casseroles, and soups. Meal planning   Eat less salt, if told by your dietitian. To do this: ? Avoid eating processed or premade food. ? Avoid eating fast food.  Eat less animal protein, including cheese, meat, poultry, or fish, if told by your dietitian. To do this: ? Limit the number of times you have meat, poultry, fish, or cheese each week. Eat a diet free of meat at least 2 days a week. ? Eat only one serving each day of meat, poultry, fish, or seafood. ? When you prepare animal protein, cut pieces into small portion sizes. For most meat and fish, one serving is about the size of one deck of cards.  Eat at least 5 servings of fresh fruits and vegetables each day. To do this: ? Keep fruits and vegetables on hand for snacks. ? Eat 1 piece of fruit or a handful of berries with breakfast. ? Have a salad and fruit at lunch. ? Have two kinds of vegetables at dinner.  Limit foods that are high in a substance called oxalate. These include: ? Spinach. ? Rhubarb. ? Beets. ? Potato chips and french fries. ? Nuts.  If you regularly take a diuretic medicine, make sure to eat at least 1-2 fruits or vegetables high in potassium each day. These include: ? Avocado. ? Banana. ? Orange, prune, carrot, or tomato juice. ? Baked potato. ? Cabbage. ? Beans and split   split peas. General instructions   Drink enough fluid to keep your urine clear or pale yellow. This is the most important thing you can do.  Talk to your health care provider and dietitian about taking daily supplements. Depending on your health and the cause of your kidney stones, you may be advised: ? Not to take supplements with vitamin C. ? To take a calcium supplement. ? To take a daily probiotic supplement. ? To take other supplements such as magnesium, fish oil, or vitamin B6.  Take all medicines and supplements as told by your health care provider.  Limit alcohol intake to no  more than 1 drink a day for nonpregnant women and 2 drinks a day for men. One drink equals 12 oz of beer, 5 oz of wine, or 1 oz of hard liquor.  Lose weight if told by your health care provider. Work with your dietitian to find strategies and an eating plan that works best for you. What foods are not recommended? Limit your intake of the following foods, or as told by your dietitian. Talk to your dietitian about specific foods you should avoid based on the type of kidney stones and your overall health. Grains Breads. Bagels. Rolls. Baked goods. Salted crackers. Cereal. Pasta. Vegetables Spinach. Rhubarb. Beets. Canned vegetables. Angie Fava. Olives. Meats and other protein foods Nuts. Nut butters. Large portions of meat, poultry, or fish. Salted or cured meats. Deli meats. Hot dogs. Sausages. Dairy Cheese. Beverages Regular soft drinks. Regular vegetable juice. Seasonings and other foods Seasoning blends with salt. Salad dressings. Canned soups. Soy sauce. Ketchup. Barbecue sauce. Canned pasta sauce. Casseroles. Pizza. Lasagna. Frozen meals. Potato chips. [place] fries. Summary  You can reduce your risk of kidney stones by making changes to your diet.  The most important thing you can do is drink enough fluid. You should drink enough fluid to keep your urine clear or pale yellow.  Ask your health care provider or dietitian how much protein from animal sources you should eat each day, and also how much salt and calcium you should have each day. This information is not intended to replace advice given to you by your health care provider. Make sure you discuss any questions you have with your health care provider. Document Released: 02/21/2011 Document Revised: 10/07/2016 Document Reviewed: 10/07/2016 Elsevier Interactive Patient Education  2019 [place] may have urinary urgency (bladder spasms) and bloody urine on / off with stent in place. This is normal.  2 - Remove tethered  stent on Tuesday morning at home by pulling on string, then blue-white plastic tubing, and discarding.   3 - Call MD or go to ER for fever >102, severe pain / nausea / vomiting not relieved by medications, or acute change in medical status

## 2019-01-23 NOTE — Anesthesia Postprocedure Evaluation (Signed)
Anesthesia Post Note  Patient: Linda [name]  Procedure(s) Performed: CYSTOSCOPY WITH RETROGRADE PYELOGRAM/URETERAL STENT PLACEMENT,LASER uteroscopy (Left Ureter)     Patient location during evaluation: PACU Anesthesia Type: General Level of consciousness: awake and alert Pain management: pain level controlled Vital Signs Assessment: post-procedure vital signs reviewed and stable Respiratory status: spontaneous breathing, nonlabored ventilation, respiratory function stable and patient connected to nasal cannula oxygen Cardiovascular status: blood pressure returned to baseline and stable Postop Assessment: no apparent nausea or vomiting Anesthetic complications: no    Last Vitals:  Vitals:   01/23/19 1251 01/23/19 1301  BP: (!) 149/70 (!) 145/70  Pulse: 92 89  Resp: 13 16  Temp:  (!) 36.3 C  SpO2: 93% 94%    Last Pain:  Vitals:   01/23/19 1301  TempSrc: Oral  PainSc:                  [place] S

## 2019-01-23 NOTE — Brief Op Note (Signed)
01/22/2019 - 01/23/2019  12:08 PM  PATIENT:  Linda [name]  73 y.o. female  PRE-OPERATIVE DIAGNOSIS:  left ureteral stone  POST-OPERATIVE DIAGNOSIS:  LEFT URETERAL STONE  PROCEDURE:  Procedure(s): CYSTOSCOPY WITH RETROGRADE PYELOGRAM/URETERAL STENT PLACEMENT,LASER uteroscopy (Left)  SURGEON:  Surgeon(s) and Role:    * Alexis Frock, MD - Primary  PHYSICIAN ASSISTANT:   ASSISTANTS: none   ANESTHESIA:   general  EBL:  minimal   BLOOD ADMINISTERED:none  DRAINS: none   LOCAL MEDICATIONS USED:  NONE  SPECIMEN:  Source of Specimen:  left ureteral / renal stone fragments  DISPOSITION OF SPECIMEN:  [facility] for compositional analysis  COUNTS:  YES  TOURNIQUET:  * No tourniquets in log *  DICTATION: .Other Dictation: Dictation Number [id number]  PLAN OF CARE: Admit for overnight observation  PATIENT DISPOSITION:  PACU - hemodynamically stable.   Delay start of Pharmacological VTE agent (>24hrs) due to surgical blood loss or risk of bleeding: yes

## 2019-01-23 NOTE — Op Note (Signed)
NAME: Linda [name], Linda [name] MEDICAL RECORD [id number] ACCOUNT [id number] DATE OF BIRTH:Jul 11, 1946 FACILITY: [facility] LOCATION: [id number] PHYSICIAN:Needham Biggins, MD  OPERATIVE REPORT  DATE OF PROCEDURE:  01/23/2019  PREOPERATIVE DIAGNOSIS:  Left ureteral stone, refractory colic.  PROCEDURE: 1.  Cystoscopy, left retrograde pyelogram, interpretation. 2.  Left ureteroscopy with laser lithotripsy. 3.  Insertion of left ureteral stent 5 x 22 Polaris with tether.  ESTIMATED BLOOD LOSS:  Nil.    SPECIMENS:  Left ureteral and renal stone fragments sent for composite analysis.  FINDINGS: 1.  Left proximal ureteral stone with mild hydronephrosis. 2.  Very small volume left intrarenal stone. 3.  Complete resolution of all accessible stone fragments larger than one-third millimeter following laser lithotripsy and basket extraction. 4.  Successful placement of left ureteral stent proximal end renal pelvis, distal end in urinary bladder.  INDICATIONS:  The patient is a 73 year old lady with history of recurrent urolithiasis.  She is unremarkable colicky flank pain to have a left proximal ureteral stone.  She was seen in the referring emergency room where, it was felt that her pain was  unable to be controlled as an outpatient.  She was therefore directly admitted to my service last night with plan for definitive stone management today.  Her urine is without infectious parameters.  She wishes to proceed.  Informed consent was obtained  and placed in medical record.  DESCRIPTION OF PROCEDURE:  The patient being brought in all upper edge of the left ureteral stent was then placed and was confirmed.  Procedure timeout was performed.  Antibiotics administered.  Intravenous antibiotics were verified and administered  within the past 24 hours with hour dosing.  A sterile field was created by prepping and draping the patient's vagina, introitus and proximal thighs using iodine.  Cystourethroscopy was performed  with a 22-French rigid cystoscope with offset lens.   Inspection of the bladder revealed no diverticula, calcifications, papillary lesions.  The left ureteral orifice was cannulated with a [place] renal catheter and left retrograde pyelogram was obtained.  Left retrograde pyelogram demonstrated a single left ureter with single system left kidney.  There was a filling defect in the proximal ureter consistent with known stone.  A 0.03 ZIPwire was advanced to lower pole and set aside as a safety wire.  An  8-French feeding tube was placed in the urinary bladder for pressure release, and semirigid ureteroscopy was performed of the distal 2/3 left ureter alongside a separate sensor working wire.  The semirigid scope was not able to reach the area of stone.   Therefore, the semirigid scope was exchanged for a 12/14 short length ureteral access sheath to the level of the mid ureter.  Using continuous fluoroscopic guidance, taking exquisite care not to pass the sheath more proximal than the previously  visualized portion, a flexible digital ureteroscopy performed the proximal ureter and systematic inspection of the left kidney, including all calices x3.  The proximal ureteral stone was quite mobile at this point and it was in a retrograde positioned  into an upper pole calix.  There was another smaller papillary tip calcification in the mid pole calix.  The larger stone was too large for simple basketing.  Holmium laser energy applied 70 setting of 0.2 joules and 20 Hz. It was fragmented into 3  smaller pieces.  These were then sequentially grasped on the long axis with an escape basket, removed and set aside for composition analysis.  The papillary tip calcification in the mid pole was also grasped and  removed in its entirety. Following this,  there was complete resolution of all accessible stone fragments larger than one-third millimeter.  Excellent hemostasis.  The access sheath was removed under continuous  vision.  There was some mild mucosal edema at the area of prior stone impaction.   Given this and access sheath usage, it was felt that brief interval stenting with a tethered stent would be warranted.  As such, a new 5 x 22 Polaris-type stent was placed over remaining safety wire using fluoroscopic guidance.  Good proximal and distal  plane were noted.  Tether was left in place and fashioned to the mons pubis and the procedure terminated.    The patient tolerated the procedure well.  No immediate perioperative complications.  The patient was taken to [place] [facility] in stable condition with plan for likely discharge home later today.  AN/NUANCE  D:01/23/2019 T:01/23/2019 JOB:[id number]

## 2019-01-23 NOTE — Discharge Summary (Signed)
Physician Discharge Summary  Patient ID: Linda [name] MRN: [id number] DOB/AGE: 73-Feb-1947 73 y.o.  Admit date: 01/22/2019 Discharge date: 01/23/2019  Admission Diagnoses: Left ureteral Stone  Discharge Diagnoses:  Active Problems:   Kidney stone   Ureteral stone with hydronephrosis   Discharged Condition: good  Hospital Course: Pt transferred from referring hospital ER 01/22/19 for left ureteral stone with refractory colic and admitted for hydration and pain control. She underwent left ureteroscopic stone manipulation / stent placement on 01/23/19 AM and rendered left side stone free. By the afternoon of 3/15 she is ambulatory, pain manageable, and felt to be adequate for discharge.   Consults: None  Significant Diagnostic Studies: labs: referring imaging  Treatments: surgery: as per above.   Discharge Exam: Blood pressure (!) 145/70, pulse 89, temperature (!) 97.3 F (36.3 C), temperature source Oral, resp. rate 16, height 5\' 2"  (1.575 m), weight 76.2 kg, SpO2 94 %. General appearance: alert, cooperative, appears stated age and family at bedside Eyes: negative Nose: Nares normal. Septum midline. Mucosa normal. No drainage or sinus tenderness. Throat: lips, mucosa, and tongue normal; teeth and gums normal Neck: supple, symmetrical, trachea midline Back: symmetric, no curvature. ROM normal. No CVA tenderness. Resp: non-labored on room air.  Cardio: Nl heart rate GI: soft, non-tender; bowel sounds normal; no masses,  no organomegaly Extremities: extremities normal, atraumatic, no cyanosis or edema Lymph nodes: Cervical, supraclavicular, and axillary nodes normal. Neurologic: Grossly normal  Disposition: Discharge disposition: 01-Home or Self Care        Allergies as of 01/23/2019      Reactions   Ciprofloxacin Itching   Codeine Nausea Only   Naproxen Hives, Itching   Nitrofurantoin Monohyd Macro Rash   Penicillins Rash, Other (See Comments)   Has patient had a  PCN reaction causing immediate rash, facial/tongue/throat swelling, SOB or lightheadedness with hypotension: No Has patient had a PCN reaction causing severe rash involving mucus membranes or skin necrosis: Yes Has patient had a PCN reaction that required hospitalization No Has patient had a PCN reaction occurring within the last 10 years: No If all of the above answers are "NO", then may proceed with Cephalosporin use.      Medication List    STOP taking these medications   acetaminophen 500 MG tablet Commonly known as:  TYLENOL   CENTRUM ADULTS PO   clindamycin 150 MG capsule Commonly known as:  CLEOCIN   sulfamethoxazole-trimethoprim 800-160 MG tablet Commonly known as:  BACTRIM DS,SEPTRA DS   tamsulosin 0.4 MG Caps capsule Commonly known as:  FLOMAX   traMADol 50 MG tablet Commonly known as:  ULTRAM     TAKE these medications   doxycycline 50 MG capsule Commonly known as:  VIBRAMYCIN Take 1 capsule (50 mg total) by mouth 2 (two) times daily. X 3 days to prevent infection with stent in place   ketorolac 10 MG tablet Commonly known as:  TORADOL Take 1 tablet (10 mg total) by mouth every 6 (six) hours as needed for moderate pain. Or stent discomfort post-operatively.   ondansetron 4 MG tablet Commonly known as:  Zofran Take 1 tablet (4 mg total) by mouth every 8 (eight) hours as needed for nausea or vomiting.   oxyCODONE-acetaminophen 5-325 MG tablet Commonly known as:  Percocet Take 1-2 tablets by mouth every 6 (six) hours as needed for severe pain. Post-operatively      Follow-up Information    Alexis Frock, MD Follow up.   Specialty:  Urology Why:  Office will  call to arrange post-op visit in abtou 3-4 weeks.  Contact information: [place] [address] [phone]           Signed: Alexis Frock 01/23/2019, 5:01 PM

## 2019-01-24 ENCOUNTER — Encounter (HOSPITAL_COMMUNITY): Payer: Self-pay | Admitting: Urology

## 2019-01-24 LAB — URINE CULTURE: Culture: NO GROWTH

## 2019-06-22 ENCOUNTER — Other Ambulatory Visit (INDEPENDENT_AMBULATORY_CARE_PROVIDER_SITE_OTHER): Payer: Medicare Other

## 2019-06-22 ENCOUNTER — Encounter: Payer: Self-pay | Admitting: Physician Assistant

## 2019-06-22 ENCOUNTER — Other Ambulatory Visit: Payer: Self-pay

## 2019-06-22 ENCOUNTER — Ambulatory Visit (INDEPENDENT_AMBULATORY_CARE_PROVIDER_SITE_OTHER): Payer: Medicare Other | Admitting: Physician Assistant

## 2019-06-22 VITALS — BP 142/82 | HR 72 | Temp 98.4°F | Ht 61.5 in | Wt 168.2 lb

## 2019-06-22 DIAGNOSIS — K760 Fatty (change of) liver, not elsewhere classified: Secondary | ICD-10-CM | POA: Diagnosis not present

## 2019-06-22 DIAGNOSIS — R7989 Other specified abnormal findings of blood chemistry: Secondary | ICD-10-CM

## 2019-06-22 DIAGNOSIS — R945 Abnormal results of liver function studies: Secondary | ICD-10-CM | POA: Diagnosis not present

## 2019-06-22 LAB — SEDIMENTATION RATE: Sed Rate: 4 mm/hr (ref 0–30)

## 2019-06-22 LAB — PROTIME-INR
INR: 1.1 ratio — ABNORMAL HIGH (ref 0.8–1.0)
Prothrombin Time: 13.4 s — ABNORMAL HIGH (ref 9.6–13.1)

## 2019-06-22 LAB — FERRITIN: Ferritin: 353.6 ng/mL — ABNORMAL HIGH (ref 10.0–291.0)

## 2019-06-22 NOTE — Patient Instructions (Addendum)
You have been scheduled for an abdominal ultrasound at [facility] (1st floor of hospital) on 06/29/19 at 9am. Please arrive 15 minutes prior to your appointment for registration. Make certain not to have anything to eat or drink 6 hours prior to your appointment. Should you need to reschedule your appointment, please contact radiology at [phone]. This test typically takes about 30 minutes to perform.  Your provider has requested that you go to the basement level for lab work before leaving today. Press "B" on the elevator. The lab is located at the first door on the left as you exit the elevator.  Please start low fat diet  Encourage weight loss of 10 lbs  Exercise walk 5 days a week   Thank you for entrusting me with your care and choosing [facility].  Amy Esterwood PA

## 2019-06-22 NOTE — Progress Notes (Signed)
Subjective:    Patient ID: Linda [name], female    DOB: 05/30/1946, 74 y.o.   MRN: [id number]  HPI Linda [name] is a pleasant 73 year old white female known to Dr. Silverio Decamp, and last seen in GI in 2018.  She has history of diverticulitis, and had colonoscopy in 2017 with finding of multiple sigmoid diverticuli and one impacted diverticulum as well as internal hemorrhoids.  No polyps. She is referred today by Dr. Vernice Jefferson for further evaluation of elevated LFTs.  Most recent labs done in July 2020 with T bili 0.9/alk phos 86/AST 86/ALT 97.  Cholesterol 237/triglycerides 208.  Hemoglobin 16.7 and platelets 191. Review of prior labs from 2018 show AST of 40/ALT of 60, and in 2017 AST 46/ALT 55. Patient had upper abdominal ultrasound in 2019 showing a diffusely fatty liver. Patient is aware that she had been told she had fatty liver in the past.  She has no family history of liver disease that she is aware of, no alcohol history and is not on any prescription medications. She brought copies of labs with her from her PCP in 2019.  She had an acute hepatitis panel with hepatitis A, B, and C all negative. She has no current GI complaints.  Review of Systems Pertinent positive and negative review of systems were noted in the above HPI section.  All other review of systems was otherwise negative.  Outpatient Encounter Medications as of 06/22/2019  Medication Sig  . Multiple Vitamins-Minerals (CENTRUM ADULTS PO) Take 1 tablet by mouth daily.  . [DISCONTINUED] doxycycline (VIBRAMYCIN) 50 MG capsule Take 1 capsule (50 mg total) by mouth 2 (two) times daily. X 3 days to prevent infection with stent in place  . [DISCONTINUED] ketorolac (TORADOL) 10 MG tablet Take 1 tablet (10 mg total) by mouth every 6 (six) hours as needed for moderate pain. Or stent discomfort post-operatively.  . [DISCONTINUED] ondansetron (ZOFRAN) 4 MG tablet Take 1 tablet (4 mg total) by mouth every 8 (eight) hours as needed for  nausea or vomiting.  . [DISCONTINUED] oxyCODONE-acetaminophen (PERCOCET) 5-325 MG tablet Take 1-2 tablets by mouth every 6 (six) hours as needed for severe pain. Post-operatively   No facility-administered encounter medications on file as of 06/22/2019.    Allergies  Allergen Reactions  . Ciprofloxacin Itching  . Codeine Nausea Only  . Naproxen Hives and Itching  . Nitrofurantoin Monohyd Macro Rash  . Penicillins Rash and Other (See Comments)    Has patient had a PCN reaction causing immediate rash, facial/tongue/throat swelling, SOB or lightheadedness with hypotension: No Has patient had a PCN reaction causing severe rash involving mucus membranes or skin necrosis: Yes Has patient had a PCN reaction that required hospitalization No Has patient had a PCN reaction occurring within the last 10 years: No If all of the above answers are "NO", then may proceed with Cephalosporin use.    Patient Active Problem List   Diagnosis Date Noted  . Ureteral stone with hydronephrosis 01/22/2019  . Diverticular disease s/p robotic sigmoid colectomy 04/08/2017 04/08/2017  . Kidney stone 09/25/2016  . Diverticulosis of colon 09/25/2016  . Hydronephrosis of left kidney 09/25/2016  . Cholelithiasis 09/25/2016  . Atherosclerosis of abdominal aorta ([place]) 09/25/2016  . Renal colic on left side 10/93/2355   Social History   Socioeconomic History  . Marital status: Widowed    Spouse name: Not on file  . Number of children: Not on file  . Years of education: Not on file  . Highest education  level: Not on file  Occupational History  . Not on file  Social Needs  . Financial resource strain: Not on file  . Food insecurity    Worry: Not on file    Inability: Not on file  . Transportation needs    Medical: Not on file    Non-medical: Not on file  Tobacco Use  . Smoking status: Former Smoker    Years: 7.00  . Smokeless tobacco: Never Used  Substance and Sexual Activity  . Alcohol use: No     Alcohol/week: 0.0 standard drinks  . Drug use: No  . Sexual activity: Never  Lifestyle  . Physical activity    Days per week: Not on file    Minutes per session: Not on file  . Stress: Not on file  Relationships  . Social Herbalist on phone: Not on file    Gets together: Not on file    Attends religious service: Not on file    Active member of club or organization: Not on file    Attends meetings of clubs or organizations: Not on file    Relationship status: Not on file  . Intimate partner violence    Fear of current or ex partner: Not on file    Emotionally abused: Not on file    Physically abused: Not on file    Forced sexual activity: Not on file  Other Topics Concern  . Not on file  Social History Narrative  . Not on file    Ms. Linda [name]'s family history includes Heart disease in her father; Nephrolithiasis in an other family member.      Objective:    Vitals:   06/22/19 0908  BP: (!) 142/82  Pulse: 72  Temp: 98.4 F (36.9 C)    Physical Exam Well-developed well-nourished older white female in no acute distress.  Pleasant   height, Weight 168, BMI 31.2  HEENT; nontraumatic normocephalic, EOMI, PE R LA, sclera anicteric. Oropharynx; not examined/wearing mass/COVID Neck; supple, no JVD Cardiovascular; regular rate and rhythm with S1-S2, no murmur rub or gallop Pulmonary; Clear bilaterally Abdomen; soft, nontender, nondistended, no palpable mass or hepatosplenomegaly, bowel sounds are active Rectal; not done today Skin; benign exam, no jaundice rash or appreciable lesions Extremities; no clubbing cyanosis or edema skin warm and dry Neuro/Psych; alert and oriented x4, grossly nonfocal mood and affect appropriate       Assessment & Plan:   #6 73 year old white female with persistently elevated transaminases over at least the past 3 years, and previously documented diffuse hepatic steatosis on ultrasound 2019.  Suspect NASH, as etiology.  No  evidence for cirrhosis by labs.  Will rule out autoimmune and inheritable forms of liver disease.  Previously documented hepatitis B and C-.  #2 colon cancer surveillance-up-to-date with colonoscopy 2017 #3 history of diverticulosis and prior diverticulitis  Plan; tentative diagnosis discussed with patient, occluding initial management with exercise such as walking 5 days/week, low-fat diet and weight loss of about 5%.  We will schedule upper abdominal ultrasound with elastography to assess degree of fibrosis Check ANA,AMA, ceruloplasmin, ESR,alpha-1 antitrypsin, ferritin, pro time/INR If work-up is consistent with NASH and she will benefit from vitamin E 800 mg daily. We will plan office follow-up with Dr. Rush Landmark or myself in 3 to 4 weeks.  Lauren Aguayo Genia Harold PA-C 06/22/2019   Cc: Cyndi Bender, PA-C

## 2019-06-24 LAB — ALPHA-1-ANTITRYPSIN: A-1 Antitrypsin, Ser: 120 mg/dL (ref 83–199)

## 2019-06-24 LAB — MITOCHONDRIAL ANTIBODIES: Mitochondrial M2 Ab, IgG: <=20 U

## 2019-06-24 LAB — ANTI-SMOOTH MUSCLE ANTIBODY, IGG: Actin (Smooth Muscle) Antibody (IGG): <20 U (ref <20)

## 2019-06-24 LAB — CERULOPLASMIN: Ceruloplasmin: 28 mg/dL (ref 18–53)

## 2019-06-24 LAB — ANA: Anti Nuclear Antibody (ANA): NEGATIVE

## 2019-06-29 ENCOUNTER — Other Ambulatory Visit: Payer: Self-pay

## 2019-06-29 ENCOUNTER — Ambulatory Visit (HOSPITAL_COMMUNITY)
Admission: RE | Admit: 2019-06-29 | Discharge: 2019-06-29 | Disposition: A | Discharge status: Home / Self Care | Payer: Medicare Other | Source: Ambulatory Visit | Attending: Physician Assistant | Admitting: Physician Assistant

## 2019-06-29 DIAGNOSIS — K760 Fatty (change of) liver, not elsewhere classified: Secondary | ICD-10-CM | POA: Diagnosis present

## 2019-06-29 DIAGNOSIS — R945 Abnormal results of liver function studies: Secondary | ICD-10-CM | POA: Diagnosis present

## 2019-06-29 DIAGNOSIS — R7989 Other specified abnormal findings of blood chemistry: Secondary | ICD-10-CM

## 2019-07-26 ENCOUNTER — Encounter: Payer: Self-pay | Admitting: Gastroenterology

## 2019-07-26 ENCOUNTER — Ambulatory Visit (INDEPENDENT_AMBULATORY_CARE_PROVIDER_SITE_OTHER): Payer: Medicare Other | Admitting: Gastroenterology

## 2019-07-26 ENCOUNTER — Other Ambulatory Visit (INDEPENDENT_AMBULATORY_CARE_PROVIDER_SITE_OTHER): Payer: Medicare Other

## 2019-07-26 ENCOUNTER — Other Ambulatory Visit: Payer: Self-pay

## 2019-07-26 VITALS — BP 114/80 | HR 86 | Temp 98.4°F | Ht 62.0 in | Wt 159.0 lb

## 2019-07-26 DIAGNOSIS — K7581 Nonalcoholic steatohepatitis (NASH): Secondary | ICD-10-CM

## 2019-07-26 DIAGNOSIS — K760 Fatty (change of) liver, not elsewhere classified: Secondary | ICD-10-CM

## 2019-07-26 DIAGNOSIS — R7989 Other specified abnormal findings of blood chemistry: Secondary | ICD-10-CM

## 2019-07-26 DIAGNOSIS — R945 Abnormal results of liver function studies: Secondary | ICD-10-CM

## 2019-07-26 LAB — COMPREHENSIVE METABOLIC PANEL
ALT: 56 U/L — ABNORMAL HIGH (ref 0–35)
AST: 52 U/L — ABNORMAL HIGH (ref 0–37)
Albumin: 4.4 g/dL (ref 3.5–5.2)
Alkaline Phosphatase: 70 U/L (ref 39–117)
BUN: 11 mg/dL (ref 6–23)
CO2: 28 mEq/L (ref 19–32)
Calcium: 10.6 mg/dL — ABNORMAL HIGH (ref 8.4–10.5)
Chloride: 105 mEq/L (ref 96–112)
Creatinine, Ser: 0.68 mg/dL (ref 0.40–1.20)
GFR: 84.76 mL/min (ref >60.00)
Glucose, Bld: 97 mg/dL (ref 70–99)
Potassium: 4.1 mEq/L (ref 3.5–5.1)
Sodium: 141 mEq/L (ref 135–145)
Total Bilirubin: 1.3 mg/dL — ABNORMAL HIGH (ref 0.2–1.2)
Total Protein: 6.9 g/dL (ref 6.0–8.3)

## 2019-07-26 LAB — CBC WITH DIFFERENTIAL/PLATELET
Basophils Absolute: 0 10*3/uL (ref 0.0–0.1)
Basophils Relative: 0.5 % (ref 0.0–3.0)
Eosinophils Absolute: 0.1 10*3/uL (ref 0.0–0.7)
Eosinophils Relative: 0.9 % (ref 0.0–5.0)
HCT: 45.3 % (ref 36.0–46.0)
Hemoglobin: 15.6 g/dL — ABNORMAL HIGH (ref 12.0–15.0)
Lymphocytes Relative: 25.6 % (ref 12.0–46.0)
Lymphs Abs: 1.6 10*3/uL (ref 0.7–4.0)
MCHC: 34.5 g/dL (ref 30.0–36.0)
MCV: 95.4 fl (ref 78.0–100.0)
Monocytes Absolute: 0.3 10*3/uL (ref 0.1–1.0)
Monocytes Relative: 5.5 % (ref 3.0–12.0)
Neutro Abs: 4.3 10*3/uL (ref 1.4–7.7)
Neutrophils Relative %: 67.5 % (ref 43.0–77.0)
Platelets: 172 10*3/uL (ref 150.0–400.0)
RBC: 4.75 Mil/uL (ref 3.87–5.11)
RDW: 12.8 % (ref 11.5–15.5)
WBC: 6.3 10*3/uL (ref 4.0–10.5)

## 2019-07-26 LAB — PROTIME-INR
INR: 1.1 ratio — ABNORMAL HIGH (ref 0.8–1.0)
Prothrombin Time: 13.1 s (ref 9.6–13.1)

## 2019-07-26 NOTE — Progress Notes (Signed)
         Linda [name]    [id number]    11/25/1945  Primary Care Physician:Conroy, Nathan, PA-C  Referring Physician: Conroy, Nathan, PA-C [address] [place],  [place] [address]   Chief complaint:  Abnormal LFT, fatty liver  HPI: 73-year-old with [place], abnormal LFT here for follow-up visit  Since diverticulitis, her diet has been predominantly high carb low fiber. She is trying to change her dietary habits and is also exercising more, has lost 10 pounds since last visit. Denies any nausea, vomiting, abdominal pain, melena or bright red blood per rectum   Abdominal ultrasound with elastography June 29, 2019 showed 6 mm gallstone in the gallbladder neck with no gallbladder wall thickening or pericholecystic fluid.  Normal CBD 4 mm. Fibrosis score F2 plus and some F3 [moderate risk]  Bilateral renal calculi with no hydronephrosis    09/2016 11/2016 02/2018  09/2018 7/ 2020  AST 46 40  134  53  86  ALT 55 60  171  70  97  Alk phos 83 74  94  82  86  T.bili 0.9 0.7  0.7  0.8  0.9    Hemoglobin A1c 5.2 Normal platelets and INR 1.1 Elevated cholesterol and triglycerides 216 Hepatitis C, hepatitis B surface antigen and core antibody, hepatitis A negative  Outpatient Encounter Medications as of 07/26/2019  Medication Sig  . Multiple Vitamins-Minerals (CENTRUM ADULTS PO) Take 1 tablet by mouth daily.   No facility-administered encounter medications on file as of 07/26/2019.     Allergies as of 07/26/2019 - Review Complete 07/26/2019  Allergen Reaction Noted  . Ciprofloxacin Itching 12/11/2015  . Codeine Nausea Only 12/11/2015  . Naproxen Hives and Itching 10/28/2011  . Nitrofurantoin monohyd macro Rash 10/28/2011  . Penicillins Rash and Other (See Comments) 10/28/2011    Past Medical History:  Diagnosis Date  . Cholelithiasis 09/25/2016  . Complication of anesthesia    was told by anesthesia in 1970's to watch her heart during surgery following a knee  surgery; but ha shad no problems with surgeries afterwards  . Diverticulitis   . Kidney stone   . Kidney stones   . Rosacea     Past Surgical History:  Procedure Laterality Date  . CESAREAN SECTION    . COLON SURGERY     for diverticulitis  . CYSTOSCOPY W/ URETERAL STENT PLACEMENT Left 01/23/2019   Procedure: CYSTOSCOPY WITH RETROGRADE PYELOGRAM/URETERAL STENT PLACEMENT,LASER uteroscopy;  Surgeon: Manny, Theodore, MD;  Location: [facility];  Service: Urology;  Laterality: Left;  . HEMORRHOID SURGERY    . KNEE SURGERY Right    x3  . lithotripsy   09/2016  . PARTIAL HYSTERECTOMY    . TOTAL SHOULDER REPLACEMENT Left 07/13/2010    Family History  Problem Relation Age of Onset  . Nephrolithiasis Other   . Heart disease Father   . Colon cancer Neg Hx   . Colon polyps Neg Hx   . Esophageal cancer Neg Hx   . Stomach cancer Neg Hx   . Liver disease Neg Hx   . Pancreatic cancer Neg Hx     Social History   Socioeconomic History  . Marital status: Widowed    Spouse name: Not on file  . Number of children: Not on file  . Years of education: Not on file  . Highest education level: Not on file  Occupational History  . Not on file  Social Needs  . Financial resource strain: Not on   file  . Food insecurity    Worry: Not on file    Inability: Not on file  . Transportation needs    Medical: Not on file    Non-medical: Not on file  Tobacco Use  . Smoking status: Former Smoker    Years: 7.00  . Smokeless tobacco: Never Used  Substance and Sexual Activity  . Alcohol use: No    Alcohol/week: 0.0 standard drinks  . Drug use: No  . Sexual activity: Never  Lifestyle  . Physical activity    Days per week: Not on file    Minutes per session: Not on file  . Stress: Not on file  Relationships  . Social Herbalist on phone: Not on file    Gets together: Not on file    Attends religious service: Not on file    Active member of club or organization: Not on file    Attends  meetings of clubs or organizations: Not on file    Relationship status: Not on file  . Intimate partner violence    Fear of current or ex partner: Not on file    Emotionally abused: Not on file    Physically abused: Not on file    Forced sexual activity: Not on file  Other Topics Concern  . Not on file  Social History Narrative  . Not on file      Review of systems: Review of Systems  Constitutional: Negative for fever and chills.  HENT: Negative.   Eyes: Negative for blurred vision.  Respiratory: Negative for cough, shortness of breath and wheezing.   Cardiovascular: Negative for chest pain and palpitations.  Gastrointestinal: as per HPI Genitourinary: Negative for dysuria, urgency, frequency and hematuria.  Musculoskeletal: Negative for myalgias, back pain and joint pain.  Skin: Negative for itching and rash.  Neurological: Negative for dizziness, tremors, focal weakness, seizures and loss of consciousness.  Endo/Heme/Allergies: Positive for seasonal allergies.  Psychiatric/Behavioral: Negative for depression, suicidal ideas and hallucinations.  All other systems reviewed and are negative.   Physical Exam: Vitals:   07/26/19 0831  BP: 114/80  Pulse: 86  Temp: 98.4 F (36.9 C)   Body mass index is 29.08 kg/m. Gen:      No acute distress HEENT:  EOMI, sclera anicteric Neck:     No masses; no thyromegaly Lungs:    Clear to auscultation bilaterally; normal respiratory effort CV:         Regular rate and rhythm; no murmurs Abd:      + bowel sounds; soft, non-tender; no palpable masses, no distension Ext:    No edema; adequate peripheral perfusion Skin:      Warm and dry; no rash Neuro: alert and oriented x 3 Psych: normal mood and affect  Data Reviewed:  Reviewed labs, radiology imaging, old records and pertinent past GI work up   Assessment and Plan/Recommendations:  73 year old female with nonalcoholic steatohepatitis, moderate fibrosis for follow-up visit   Start vitamin E 1 capsule every other day Continue multivitamins Discussed diet and exercise in detail with goal 5% weight loss in the next 3 to 6 months Advised patient to avoid high carbohydrate diet or artificial sweeteners, high fructose corn syrup or low-fat labeled processed diet.  Continue to avoid alcohol and hepatotoxins  Okay to use Tylenol up to 2 g in divided doses daily as needed, limit use of NSAIDs  Recheck CMP, CBC, PT and INR  History of gallstones with no acute cholecystitis, continue  to monitor  Return in 6 months or sooner if needed  25 minutes was spent face-to-face with the patient. Greater than 50% of the time used for counseling as well as treatment plan and follow-up. She had multiple questions which were answered to her satisfaction  K. Denzil Magnuson , MD    CC: Cyndi Bender, PA-C

## 2019-07-26 NOTE — Patient Instructions (Addendum)
Go to the basement today for labs  AVOID artificial sweeteners or low fat labeled processed diets  Eat low carbohydrate high protein diets with vegetables   Exercise  daily  For 20-30 minutes daily  Take Vitamin E capsules every other day  Follow up in 6 months  I appreciate the  opportunity to care for you  Thank You   Harl Bowie , MD

## 2019-07-27 ENCOUNTER — Telehealth: Payer: Self-pay | Admitting: Gastroenterology

## 2019-07-27 NOTE — Telephone Encounter (Signed)
Called to the patient and got her voicemail. Left a message for her of no more than 6 tablets total in divided doses of regular strength Tylenol or no more than 4 tablets total in divided doses in a 24 hour period.

## 2019-07-27 NOTE — Telephone Encounter (Signed)
Pt needs to know how to take tylenol, she states that Dr. Silverio Decamp gave her instructions but she does not remember. It is ok to leave a detailed msg on her phone.

## 2019-07-27 NOTE — Telephone Encounter (Signed)
She can take up to maximum 2 g a day in divided doses 2-3 times daily as needed

## 2019-08-29 NOTE — Progress Notes (Signed)
Reviewed and agree with documentation and assessment and plan. K. Veena Nandigam , MD   

## 2020-01-25 ENCOUNTER — Other Ambulatory Visit (INDEPENDENT_AMBULATORY_CARE_PROVIDER_SITE_OTHER): Payer: Medicare Other

## 2020-01-25 ENCOUNTER — Other Ambulatory Visit: Payer: Self-pay

## 2020-01-25 ENCOUNTER — Ambulatory Visit: Payer: Medicare Other | Admitting: Gastroenterology

## 2020-01-25 ENCOUNTER — Encounter: Payer: Self-pay | Admitting: Gastroenterology

## 2020-01-25 VITALS — BP 140/80 | HR 88 | Temp 98.5°F | Ht 62.0 in | Wt 168.0 lb

## 2020-01-25 DIAGNOSIS — R7989 Other specified abnormal findings of blood chemistry: Secondary | ICD-10-CM | POA: Diagnosis not present

## 2020-01-25 DIAGNOSIS — K760 Fatty (change of) liver, not elsewhere classified: Secondary | ICD-10-CM

## 2020-01-25 LAB — HEPATIC FUNCTION PANEL
ALT: 42 U/L — ABNORMAL HIGH (ref 0–35)
AST: 34 U/L (ref 0–37)
Albumin: 4.3 g/dL (ref 3.5–5.2)
Alkaline Phosphatase: 75 U/L (ref 39–117)
Bilirubin, Direct: 0.1 mg/dL (ref 0.0–0.3)
Total Bilirubin: 0.9 mg/dL (ref 0.2–1.2)
Total Protein: 7.1 g/dL (ref 6.0–8.3)

## 2020-01-25 NOTE — Progress Notes (Signed)
01/25/2020 PEARLENE TEAT [id number] 1946-01-24   HISTORY OF PRESENT ILLNESS: This is a pleasant 74 year old female who is a patient of Dr. Woodward Ku.  She is here today for follow-up of elevated LFTs and fatty liver.  She had abdominal ultrasound with elastography in August 2020 that showed 6 mm gallstone in the gallbladder neck with no gallbladder wall thickening or pericholecystic fluid.  Normal CBD 4 mm.  Fibrosis score F2 plus some F3 moderate risk.  Last labs in September 2020 showed total bili of 1.3, alk phos normal 70, AST 52, ALT 56.  Normal platelets.  INR 1.1.  She says that she feels well.  No complaints.  She says that she tried the vitamin E capsules but they caused herr nausea so she is no longer taking those.  She does admit that she eats a lot of potatoes and we talked about decreasing her intake of those.  She says that now that the weather is getting better she will get outside and try to be more active and exercise more.   Past Medical History:  Diagnosis Date  . Cholelithiasis 09/25/2016  . Complication of anesthesia    was told by anesthesia in 1970's to watch her heart during surgery following a knee surgery; but ha shad no problems with surgeries afterwards  . Diverticulitis   . Kidney stone   . Kidney stones   . Rosacea    Past Surgical History:  Procedure Laterality Date  . CESAREAN SECTION    . COLON SURGERY     for diverticulitis  . CYSTOSCOPY W/ URETERAL STENT PLACEMENT Left 01/23/2019   Procedure: CYSTOSCOPY WITH RETROGRADE PYELOGRAM/URETERAL STENT PLACEMENT,LASER uteroscopy;  Surgeon: Alexis Frock, MD;  Location: [facility];  Service: Urology;  Laterality: Left;  . HEMORRHOID SURGERY    . KNEE SURGERY Right    x3  . lithotripsy   09/2016  . PARTIAL HYSTERECTOMY    . TOTAL SHOULDER REPLACEMENT Left 07/13/2010    reports that she has quit smoking. She quit after 7.00 years of use. She has never used smokeless tobacco. She reports that she does not  drink alcohol or use drugs. family history includes Heart disease in her father; Nephrolithiasis in an other family member. Allergies  Allergen Reactions  . Ciprofloxacin Itching  . Codeine Nausea Only  . Naproxen Hives and Itching  . Nitrofurantoin Monohyd Macro Rash  . Penicillins Rash and Other (See Comments)    Has patient had a PCN reaction causing immediate rash, facial/tongue/throat swelling, SOB or lightheadedness with hypotension: No Has patient had a PCN reaction causing severe rash involving mucus membranes or skin necrosis: Yes Has patient had a PCN reaction that required hospitalization No Has patient had a PCN reaction occurring within the last 10 years: No If all of the above answers are "NO", then may proceed with Cephalosporin use.       Outpatient Encounter Medications as of 01/25/2020  Medication Sig  . Multiple Vitamins-Minerals (CENTRUM ADULTS PO) Take 1 tablet by mouth daily.   No facility-administered encounter medications on file as of 01/25/2020.     REVIEW OF SYSTEMS  : All other systems reviewed and negative except where noted in the History of Present Illness.   PHYSICAL EXAM: BP 140/80   Pulse 88   Temp 98.5 F (36.9 C)   Ht '5\' 2"'$  (1.575 m)   Wt 168 lb (76.2 kg)   BMI 30.73 kg/m  General: Well developed white female in no acute  distress Head: Normocephalic and atraumatic Eyes:  Sclerae anicteric, conjunctiva pink. Ears: Normal auditory acuity Lungs: Clear throughout to auscultation; no increased WOB. Heart: Regular rate and rhythm; no M/R/G. Abdomen: Soft, non-distended.  BS present.  Non-tender. Musculoskeletal: Symmetrical with no gross deformities  Skin: No lesions on visible extremities Extremities: No edema  Neurological: Alert oriented x 4, grossly non-focal Psychological:  Alert and cooperative. Normal mood and affect  ASSESSMENT AND PLAN: *74 year old female with nonalcoholic steatohepatitis, moderate fibrosis here for follow-up.   She could not tolerate the vitamin E capsules due to side effect of nausea.  She says that now that the weather is getting better she will begin exercising again.  We again discussed diet and she is going to decrease her potato intake as she admits that she does eat a lot of potatoes.  She does not drink alcohol or take any medications really.  Was advised that she can use Tylenol up to 2 g daily.  I am going to repeat just a hepatic function panel today.  We will plan for repeat labs again then in 6 months pending that her labs from today are stable.  In 6 months we will check CBC, CMP, PT/INR, and lipid panel.  Labs in 6 months and as long as stable then she can follow-up office visit in 1 year   CC:  Cyndi Bender, PA-C

## 2020-01-25 NOTE — Patient Instructions (Addendum)
If you are age 74 or older, your body mass index should be between 23-30. Your Body mass index is 30.73 kg/m. If this is out of the aforementioned range listed, please consider follow up with your Primary Care Provider.  If you are age 33 or younger, your body mass index should be between 19-25. Your Body mass index is 30.73 kg/m. If this is out of the aformentioned range listed, please consider follow up with your Primary Care Provider.   Your provider has requested that you go to the basement level for lab work before leaving today. Press "B" on the elevator. The lab is located at the first door on the left as you exit the elevator.   It was a pleasure to see you today!  Alonza Bogus, PA

## 2020-02-03 NOTE — Progress Notes (Signed)
Reviewed and agree with documentation and assessment and plan. K. Veena Nandigam , MD   

## 2020-07-30 ENCOUNTER — Other Ambulatory Visit (INDEPENDENT_AMBULATORY_CARE_PROVIDER_SITE_OTHER): Payer: Medicare Other

## 2020-07-30 DIAGNOSIS — R7989 Other specified abnormal findings of blood chemistry: Secondary | ICD-10-CM

## 2020-07-30 DIAGNOSIS — K760 Fatty (change of) liver, not elsewhere classified: Secondary | ICD-10-CM | POA: Diagnosis not present

## 2020-07-30 LAB — HEPATIC FUNCTION PANEL
ALT: 68 U/L — ABNORMAL HIGH (ref 0–35)
AST: 49 U/L — ABNORMAL HIGH (ref 0–37)
Albumin: 4.2 g/dL (ref 3.5–5.2)
Alkaline Phosphatase: 73 U/L (ref 39–117)
Bilirubin, Direct: 0.1 mg/dL (ref 0.0–0.3)
Total Bilirubin: 0.8 mg/dL (ref 0.2–1.2)
Total Protein: 6.6 g/dL (ref 6.0–8.3)

## 2020-08-10 ENCOUNTER — Telehealth: Payer: Self-pay | Admitting: Gastroenterology

## 2020-08-10 NOTE — Telephone Encounter (Signed)
Returning your call on lab results

## 2020-08-10 NOTE — Telephone Encounter (Signed)
Zehr, Laban Emperor, PA-C  Timothy Lasso, RN Liver enzymes remain mildly elevated. Continue to work on diet and exercise. Repeat CBC, LFTs, and PT/INR in 6 months as well as an OV follow-up. Labs first.   Thank you,   Keane Scrape

## 2020-08-10 NOTE — Telephone Encounter (Signed)
The patient has been notified of this information and all questions answered. She will come in 6 months for repeat labs and follow up.  We will call her at that time to set up

## 2020-12-08 ENCOUNTER — Emergency Department (HOSPITAL_BASED_OUTPATIENT_CLINIC_OR_DEPARTMENT_OTHER): Payer: Medicare Other

## 2020-12-08 ENCOUNTER — Other Ambulatory Visit: Payer: Self-pay

## 2020-12-08 ENCOUNTER — Inpatient Hospital Stay (HOSPITAL_BASED_OUTPATIENT_CLINIC_OR_DEPARTMENT_OTHER)
Admission: EM | Admit: 2020-12-08 | Discharge: 2020-12-10 | DRG: 177 | Disposition: A | Discharge status: Home Health | Payer: Medicare Other | Attending: Family Medicine | Admitting: Family Medicine

## 2020-12-08 ENCOUNTER — Encounter (HOSPITAL_BASED_OUTPATIENT_CLINIC_OR_DEPARTMENT_OTHER): Payer: Self-pay | Admitting: *Deleted

## 2020-12-08 DIAGNOSIS — N179 Acute kidney failure, unspecified: Secondary | ICD-10-CM | POA: Diagnosis present

## 2020-12-08 DIAGNOSIS — U071 COVID-19: Principal | ICD-10-CM | POA: Diagnosis present

## 2020-12-08 DIAGNOSIS — Z87891 Personal history of nicotine dependence: Secondary | ICD-10-CM

## 2020-12-08 DIAGNOSIS — K760 Fatty (change of) liver, not elsewhere classified: Secondary | ICD-10-CM | POA: Diagnosis present

## 2020-12-08 DIAGNOSIS — R197 Diarrhea, unspecified: Secondary | ICD-10-CM | POA: Diagnosis present

## 2020-12-08 DIAGNOSIS — Z79899 Other long term (current) drug therapy: Secondary | ICD-10-CM

## 2020-12-08 DIAGNOSIS — Z8249 Family history of ischemic heart disease and other diseases of the circulatory system: Secondary | ICD-10-CM

## 2020-12-08 DIAGNOSIS — E876 Hypokalemia: Secondary | ICD-10-CM | POA: Diagnosis present

## 2020-12-08 DIAGNOSIS — J9601 Acute respiratory failure with hypoxia: Secondary | ICD-10-CM | POA: Diagnosis present

## 2020-12-08 DIAGNOSIS — Z87442 Personal history of urinary calculi: Secondary | ICD-10-CM

## 2020-12-08 DIAGNOSIS — Z96612 Presence of left artificial shoulder joint: Secondary | ICD-10-CM | POA: Diagnosis present

## 2020-12-08 DIAGNOSIS — R112 Nausea with vomiting, unspecified: Secondary | ICD-10-CM | POA: Diagnosis present

## 2020-12-08 DIAGNOSIS — I10 Essential (primary) hypertension: Secondary | ICD-10-CM | POA: Diagnosis present

## 2020-12-08 DIAGNOSIS — R0902 Hypoxemia: Secondary | ICD-10-CM

## 2020-12-08 DIAGNOSIS — Z885 Allergy status to narcotic agent status: Secondary | ICD-10-CM

## 2020-12-08 DIAGNOSIS — Z888 Allergy status to other drugs, medicaments and biological substances status: Secondary | ICD-10-CM

## 2020-12-08 DIAGNOSIS — I700 Atherosclerosis of aorta: Secondary | ICD-10-CM | POA: Diagnosis present

## 2020-12-08 DIAGNOSIS — J1282 Pneumonia due to coronavirus disease 2019: Secondary | ICD-10-CM | POA: Diagnosis present

## 2020-12-08 DIAGNOSIS — Z880 Allergy status to penicillin: Secondary | ICD-10-CM

## 2020-12-08 DIAGNOSIS — D696 Thrombocytopenia, unspecified: Secondary | ICD-10-CM | POA: Diagnosis present

## 2020-12-08 DIAGNOSIS — Z90710 Acquired absence of both cervix and uterus: Secondary | ICD-10-CM

## 2020-12-08 LAB — CBC WITH DIFFERENTIAL/PLATELET
Abs Immature Granulocytes: 0.02 10*3/uL (ref 0.00–0.07)
Basophils Absolute: 0 10*3/uL (ref 0.0–0.1)
Basophils Relative: 0 %
Eosinophils Absolute: 0 10*3/uL (ref 0.0–0.5)
Eosinophils Relative: 0 %
HCT: 43.6 % (ref 36.0–46.0)
Hemoglobin: 15.6 g/dL — ABNORMAL HIGH (ref 12.0–15.0)
Immature Granulocytes: 0 %
Lymphocytes Relative: 21 %
Lymphs Abs: 1.4 10*3/uL (ref 0.7–4.0)
MCH: 32.6 pg (ref 26.0–34.0)
MCHC: 35.8 g/dL (ref 30.0–36.0)
MCV: 91.2 fL (ref 80.0–100.0)
Monocytes Absolute: 0.8 10*3/uL (ref 0.1–1.0)
Monocytes Relative: 12 %
Neutro Abs: 4.7 10*3/uL (ref 1.7–7.7)
Neutrophils Relative %: 67 %
Platelets: 149 10*3/uL — ABNORMAL LOW (ref 150–400)
RBC: 4.78 MIL/uL (ref 3.87–5.11)
RDW: 13.3 % (ref 11.5–15.5)
WBC: 7 10*3/uL (ref 4.0–10.5)
nRBC: 0 % (ref 0.0–0.2)

## 2020-12-08 LAB — TRIGLYCERIDES: Triglycerides: 147 mg/dL (ref <150)

## 2020-12-08 LAB — C-REACTIVE PROTEIN: CRP: 3.6 mg/dL — ABNORMAL HIGH (ref <1.0)

## 2020-12-08 LAB — COMPREHENSIVE METABOLIC PANEL
ALT: 100 U/L — ABNORMAL HIGH (ref 0–44)
AST: 121 U/L — ABNORMAL HIGH (ref 15–41)
Albumin: 3.8 g/dL (ref 3.5–5.0)
Alkaline Phosphatase: 52 U/L (ref 38–126)
Anion gap: 12 (ref 5–15)
BUN: 19 mg/dL (ref 8–23)
CO2: 23 mmol/L (ref 22–32)
Calcium: 9.3 mg/dL (ref 8.9–10.3)
Chloride: 101 mmol/L (ref 98–111)
Creatinine, Ser: 1.04 mg/dL — ABNORMAL HIGH (ref 0.44–1.00)
GFR, Estimated: 56 mL/min — ABNORMAL LOW (ref >60)
Glucose, Bld: 129 mg/dL — ABNORMAL HIGH (ref 70–99)
Potassium: 3.2 mmol/L — ABNORMAL LOW (ref 3.5–5.1)
Sodium: 136 mmol/L (ref 135–145)
Total Bilirubin: 1.5 mg/dL — ABNORMAL HIGH (ref 0.3–1.2)
Total Protein: 6.9 g/dL (ref 6.5–8.1)

## 2020-12-08 LAB — SARS CORONAVIRUS 2 BY RT PCR (HOSPITAL ORDER, PERFORMED IN ~~LOC~~ HOSPITAL LAB): SARS Coronavirus 2: POSITIVE — AB

## 2020-12-08 LAB — LACTATE DEHYDROGENASE: LDH: 423 U/L — ABNORMAL HIGH (ref 98–192)

## 2020-12-08 LAB — D-DIMER, QUANTITATIVE: D-Dimer, Quant: 1.26 ug/mL-FEU — ABNORMAL HIGH (ref 0.00–0.50)

## 2020-12-08 LAB — FIBRINOGEN: Fibrinogen: 437 mg/dL (ref 210–475)

## 2020-12-08 LAB — LACTIC ACID, PLASMA
Lactic Acid, Venous: 2 mmol/L (ref 0.5–1.9)
Lactic Acid, Venous: 1.6 mmol/L (ref 0.5–1.9)

## 2020-12-08 LAB — FERRITIN: Ferritin: 2997 ng/mL — ABNORMAL HIGH (ref 11–307)

## 2020-12-08 LAB — PROCALCITONIN: Procalcitonin: 0.13 ng/mL

## 2020-12-08 MED ORDER — ZINC SULFATE 220 (50 ZN) MG PO CAPS
220.0000 mg | ORAL_CAPSULE | Freq: Every day | ORAL | Status: DC
  Administered 2020-12-08 – 2020-12-10 (×3): 220 mg via ORAL
  Filled 2020-12-08 (×3): qty 1

## 2020-12-08 MED ORDER — GUAIFENESIN-DM 100-10 MG/5ML PO SYRP
10.0000 mL | ORAL_SOLUTION | ORAL | Status: DC | PRN
  Administered 2020-12-08 – 2020-12-09 (×2): 10 mL via ORAL
  Filled 2020-12-08 (×2): qty 10

## 2020-12-08 MED ORDER — ACETAMINOPHEN 325 MG PO TABS: 650.0000 mg | ORAL_TABLET | Freq: Four times a day (QID) | ORAL | Status: DC | PRN

## 2020-12-08 MED ORDER — SODIUM CHLORIDE 0.9 % IV SOLN
100.0000 mg | Freq: Every day | INTRAVENOUS | Status: DC
  Administered 2020-12-09 – 2020-12-10 (×2): 100 mg via INTRAVENOUS
  Filled 2020-12-08 (×2): qty 20

## 2020-12-08 MED ORDER — DEXAMETHASONE SODIUM PHOSPHATE 10 MG/ML IJ SOLN
6.0000 mg | Freq: Once | INTRAMUSCULAR | Status: AC
  Administered 2020-12-08: 6 mg via INTRAVENOUS
  Filled 2020-12-08: qty 1

## 2020-12-08 MED ORDER — POTASSIUM CHLORIDE CRYS ER 20 MEQ PO TBCR
40.0000 meq | EXTENDED_RELEASE_TABLET | Freq: Once | ORAL | Status: AC
  Administered 2020-12-08: 40 meq via ORAL
  Filled 2020-12-08: qty 2

## 2020-12-08 MED ORDER — ONDANSETRON HCL 4 MG/2ML IJ SOLN
4.0000 mg | Freq: Four times a day (QID) | INTRAMUSCULAR | Status: DC | PRN
  Administered 2020-12-09 (×2): 4 mg via INTRAVENOUS
  Filled 2020-12-08 (×2): qty 2

## 2020-12-08 MED ORDER — SODIUM CHLORIDE 0.9 % IV SOLN
200.0000 mg | Freq: Once | INTRAVENOUS | Status: AC
  Administered 2020-12-08: 200 mg via INTRAVENOUS
  Filled 2020-12-08: qty 200

## 2020-12-08 MED ORDER — SODIUM CHLORIDE 0.9 % IV SOLN: 100.0000 mg | Freq: Every day | INTRAVENOUS | Status: DC

## 2020-12-08 MED ORDER — SODIUM CHLORIDE 0.9 % IV BOLUS
1000.0000 mL | Freq: Once | INTRAVENOUS | Status: AC
  Administered 2020-12-08: 1000 mL via INTRAVENOUS

## 2020-12-08 MED ORDER — DEXAMETHASONE 4 MG PO TABS
6.0000 mg | ORAL_TABLET | ORAL | Status: DC
  Administered 2020-12-08 – 2020-12-09 (×2): 6 mg via ORAL
  Filled 2020-12-08 (×2): qty 2

## 2020-12-08 MED ORDER — SODIUM CHLORIDE 0.9 % IV SOLN: 200.0000 mg | Freq: Once | INTRAVENOUS | Status: DC

## 2020-12-08 MED ORDER — IPRATROPIUM-ALBUTEROL 20-100 MCG/ACT IN AERS
1.0000 | INHALATION_SPRAY | Freq: Four times a day (QID) | RESPIRATORY_TRACT | Status: DC
  Administered 2020-12-08 – 2020-12-10 (×5): 1 via RESPIRATORY_TRACT
  Filled 2020-12-08: qty 4

## 2020-12-08 MED ORDER — ONDANSETRON HCL 4 MG PO TABS: 4.0000 mg | ORAL_TABLET | Freq: Four times a day (QID) | ORAL | Status: DC | PRN

## 2020-12-08 MED ORDER — ENOXAPARIN SODIUM 40 MG/0.4ML ~~LOC~~ SOLN
40.0000 mg | SUBCUTANEOUS | Status: DC
  Administered 2020-12-08 – 2020-12-09 (×2): 40 mg via SUBCUTANEOUS
  Filled 2020-12-08 (×2): qty 0.4

## 2020-12-08 MED ORDER — ASCORBIC ACID 500 MG PO TABS
500.0000 mg | ORAL_TABLET | Freq: Every day | ORAL | Status: DC
  Administered 2020-12-08 – 2020-12-10 (×3): 500 mg via ORAL
  Filled 2020-12-08 (×3): qty 1

## 2020-12-08 NOTE — ED Notes (Signed)
Date and time results received: 12/08/20 1620   Test: lactic acid Critical Value: 2.0  Name of Provider Notified: Mickel Baas PA Orders Received? Or Actions Taken?: no orders given

## 2020-12-08 NOTE — ED Triage Notes (Addendum)
Pt reports being sick since Jan 14. Fever, headache, productive cough, weakness. She had neg flu test on Thursday and is still waiting for results of covid test. She has not been vaccinated against covid. O2 sats 89-91% on room air upon arrival to room 11 after transferring to stretcher from wheelchair. Sats now 96% on room air while at rest. Reports temp 101.6 at home this morning and she took tylenol around noon

## 2020-12-08 NOTE — H&P (Incomplete)
History and Physical   Linda [name] WUX:[id number] DOB: 06-11-1946 DOA: 12/08/2020  Referring MD/NP/PA: Dr. Tamera Punt  PCP: Leonides Sake, MD   Outpatient Specialists: None  Patient coming from: Home via [facility]  Chief Complaint: Nausea with vomiting after Covid infection  HPI: Linda [name] is a 75 y.o. female with medical history significant of cholelithiasis, diverticulitis and and kidney stones who was not vaccinated against COVID-19 presenting with about a week of weakness nausea vomiting and diarrhea.  Also has cough fever headaches and body aches.  Symptoms have been going on for about 2 weeks now.  She saw her PCP 2 days ago.  Patient had negative flu test.  Nausea vomiting.  Patient denied abdominal pain.  Was seen in the ER today after 1 week of isolation.  She was tested today in the ER and found to be COVID-19 positive.  Patient has no significant respiratory symptoms and was admitted to the hospital for further evaluation and treatment.  She is having more GI symptoms with no respiratory symptoms.  Patient is being admitted for further treatment..  ED Course: Temperature 98 for blood pressure 145/81 pulse 100 respirate 20 oxygen sat 89% room air 98% on 2 L.  White count 7.0 hemoglobin 15.6 and platelets 149.  Sodium 136 potassium 3.2 chloride 104 CO2 23 BUN 19 creatinine 1.04.  Review of Systems: As per HPI otherwise 10 point review of systems negative.    Past Medical History:  Diagnosis Date  . Cholelithiasis 09/25/2016  . Complication of anesthesia    was told by anesthesia in 1970's to watch her heart during surgery following a knee surgery; but ha shad no problems with surgeries afterwards  . Diverticulitis   . Kidney stone   . Kidney stones   . Rosacea     Past Surgical History:  Procedure Laterality Date  . CESAREAN SECTION    . COLON SURGERY     for diverticulitis  . CYSTOSCOPY W/ URETERAL STENT PLACEMENT Left 01/23/2019   Procedure:  CYSTOSCOPY WITH RETROGRADE PYELOGRAM/URETERAL STENT PLACEMENT,LASER uteroscopy;  Surgeon: Alexis Frock, MD;  Location: [facility];  Service: Urology;  Laterality: Left;  . HEMORRHOID SURGERY    . KNEE SURGERY Right    x3  . lithotripsy   09/2016  . PARTIAL HYSTERECTOMY    . TOTAL SHOULDER REPLACEMENT Left 07/13/2010     reports that she has quit smoking. She quit after 7.00 years of use. She has never used smokeless tobacco. She reports that she does not drink alcohol and does not use drugs.  Allergies  Allergen Reactions  . Ciprofloxacin Itching  . Codeine Nausea Only  . Naproxen Hives and Itching  . Nitrofurantoin Monohyd Macro Rash  . Penicillins Rash and Other (See Comments)    Has patient had a PCN reaction causing immediate rash, facial/tongue/throat swelling, SOB or lightheadedness with hypotension: No Has patient had a PCN reaction causing severe rash involving mucus membranes or skin necrosis: Yes Has patient had a PCN reaction that required hospitalization No Has patient had a PCN reaction occurring within the last 10 years: No If all of the above answers are "NO", then may proceed with Cephalosporin use.     Family History  Problem Relation Age of Onset  . Nephrolithiasis Other   . Heart disease Father   . Colon cancer Neg Hx   . Colon polyps Neg Hx   . Esophageal cancer Neg Hx   . Stomach cancer Neg Hx   .  Liver disease Neg Hx   . Pancreatic cancer Neg Hx      Prior to Admission medications   Medication Sig Start Date End Date Taking? Authorizing Provider  Multiple Vitamins-Minerals (CENTRUM ADULTS PO) Take 1 tablet by mouth daily.    [provider]    Physical Exam: Vitals:   12/08/20 1510 12/08/20 1630 12/08/20 1700 12/08/20 1940  BP:  133/73 132/73 134/81  Pulse: 94 91 90 92  Resp: 13 15 13 20   Temp:    98.3 F (36.8 C)  TempSrc:    Oral  SpO2: 98% 97% 98% 95%      Constitutional: NAD, calm, comfortable Vitals:   12/08/20 1510  12/08/20 1630 12/08/20 1700 12/08/20 1940  BP:  133/73 132/73 134/81  Pulse: 94 91 90 92  Resp: 13 15 13 20   Temp:    98.3 F (36.8 C)  TempSrc:    Oral  SpO2: 98% 97% 98% 95%   Eyes: PERRL, lids and conjunctivae normal ENMT: Mucous membranes are moist. Posterior pharynx clear of any exudate or lesions.Normal dentition.  Neck: normal, supple, no masses, no thyromegaly Respiratory: clear to auscultation bilaterally, no wheezing, no crackles. Normal respiratory effort. No accessory muscle use.  Cardiovascular: Regular rate and rhythm, no murmurs / rubs / gallops. No extremity edema. 2+ pedal pulses. No carotid bruits.  Abdomen: no tenderness, no masses palpated. No hepatosplenomegaly. Bowel sounds positive.  Musculoskeletal: no clubbing / cyanosis. No joint deformity upper and lower extremities. Good ROM, no contractures. Normal muscle tone.  Skin: no rashes, lesions, ulcers. No induration Neurologic: CN 2-12 grossly intact. Sensation intact, DTR normal. Strength 5/5 in all 4.  Psychiatric: Normal judgment and insight. Alert and oriented x 3. Normal mood.     Labs on Admission: I have personally reviewed following labs and imaging studies  CBC: Recent Labs  Lab 12/08/20 1526  WBC 7.0  NEUTROABS 4.7  HGB 15.6*  HCT 43.6  MCV 91.2  PLT 123456*   Basic Metabolic Panel: Recent Labs  Lab 12/08/20 1526  NA 136  K 3.2*  CL 101  CO2 23  GLUCOSE 129*  BUN 19  CREATININE 1.04*  CALCIUM 9.3   GFR: CrCl cannot be calculated (Unknown ideal weight.). Liver Function Tests: Recent Labs  Lab 12/08/20 1526  AST 121*  ALT 100*  ALKPHOS 52  BILITOT 1.5*  PROT 6.9  ALBUMIN 3.8   No results for input(s): LIPASE, AMYLASE in the last 168 hours. No results for input(s): AMMONIA in the last 168 hours. Coagulation Profile: No results for input(s): INR, PROTIME in the last 168 hours. Cardiac Enzymes: No results for input(s): CKTOTAL, CKMB, CKMBINDEX, TROPONINI in the last 168  hours. BNP (last 3 results) No results for input(s): PROBNP in the last 8760 hours. HbA1C: No results for input(s): HGBA1C in the last 72 hours. CBG: No results for input(s): GLUCAP in the last 168 hours. Lipid Profile: Recent Labs    12/08/20 1643  TRIG 147   Thyroid Function Tests: No results for input(s): TSH, T4TOTAL, FREET4, T3FREE, THYROIDAB in the last 72 hours. Anemia Panel: Recent Labs    12/08/20 1643  FERRITIN 2,997*   Urine analysis:    Component Value Date/Time   COLORURINE YELLOW 01/22/2019 1235   APPEARANCEUR HAZY (A) 01/22/2019 1235   LABSPEC 1.025 01/22/2019 1235   PHURINE 6.0 01/22/2019 1235   GLUCOSEU NEGATIVE 01/22/2019 1235   HGBUR LARGE (A) 01/22/2019 1235   BILIRUBINUR SMALL (A) 01/22/2019 1235  KETONESUR NEGATIVE 01/22/2019 1235   PROTEINUR NEGATIVE 01/22/2019 1235   UROBILINOGEN 0.2 12/01/2011 0913   NITRITE NEGATIVE 01/22/2019 1235   LEUKOCYTESUR MODERATE (A) 01/22/2019 1235   Sepsis Labs: @LABRCNTIP (procalcitonin:4,lacticidven:4) ) Recent Results (from the past 240 hour(s))  SARS Coronavirus 2 by RT PCR (hospital order, performed in [facility] [facility] hospital lab) Nasopharyngeal Nasopharyngeal Swab     Status: Abnormal   Collection Time: 12/08/20  3:09 PM   Specimen: Nasopharyngeal Swab  Result Value Ref Range Status   SARS Coronavirus 2 POSITIVE (A) NEGATIVE Final    Comment: RESULT CALLED TO, READ BACK BY AND VERIFIED WITH: [name] RN ON 12/08/20 AT 1619 [place] (NOTE) SARS-CoV-2 target nucleic acids are DETECTED  SARS-CoV-2 RNA is generally detectable in upper respiratory specimens  during the acute phase of infection.  Positive results are indicative  of the presence of the identified virus, but do not rule out bacterial infection or co-infection with other pathogens not detected by the test.  Clinical correlation with patient history and  other diagnostic information is necessary to determine patient infection status.  The expected  result is negative.  Fact Sheet for Patients:   StrictlyIdeas.no   Fact Sheet for Healthcare Providers:   BankingDealers.co.za    This test is not yet approved or cleared by the [place] FDA and  has been authorized for detection and/or diagnosis of SARS-CoV-2 by FDA under an Emergency Use Authorization (EUA).  This EUA will remain in effect (meaning this  test can be used) for the duration of  the COVID-19 declaration under Section 564(b)(1) of the Act, 21 U.S.C. section 360-bbb-3(b)(1), unless the authorization is terminated or revoked sooner.  Performed at [facility], [address] [place], [place] [address]      Radiological Exams on Admission: DG Chest [facility] 1 View  Result Date: 12/08/2020 CLINICAL DATA:  Shortness of breath, fever, productive cough, weakness and headache. EXAM: PORTABLE CHEST 1 VIEW COMPARISON:  07/13/2010 FINDINGS: Poor depth of inspiration. Mildly enlarged cardiac silhouette. Tortuous and partially calcified thoracic aorta. Minimal ill-defined increased density at both lung bases compatible with atelectasis. Small amount of patchy density in the left mid to lower lung zone laterally. Diffuse osteopenia. Left humeral head prosthesis. IMPRESSION: 1. Mild left mid to lower lung zone pneumonia laterally. 2. Poor inspiration with minimal bibasilar atelectasis. 3. Mild cardiomegaly. Electronically Signed   By: Claudie Revering M.D.   On: 12/08/2020 15:44    EKG: Independently reviewed. ***  Assessment/Plan Principal Problem:   COVID-19 Active Problems:   Atherosclerosis of abdominal aorta (HCC)   Hypokalemia     ***   DVT prophylaxis: ***  Code Status: ***  Family Communication: ***  Disposition Plan: ***  Consults called: ***  Admission status: ***   Severity of Illness: {Observation/Inpatient:21159}   Tykwon Fera,LAWAL MD [facility] Pager [phone]  If 7PM-7AM, please contact  night-coverage [email or web address] Password [facility]  12/08/2020, 8:41 PM

## 2020-12-08 NOTE — ED Provider Notes (Signed)
[facility] EMERGENCY DEPARTMENT Provider Note   CSN: [id number] Arrival date & time: 12/08/20  1441     History Chief Complaint  Patient presents with  . Fever    headache    Linda [name] is a 75 y.o. female.  75 year old female with no significant medical history, not vaccinated against COVID-19, presents with complaint of generalized weakness, vomiting, diarrhea, cough, fever, headaches and body aches.  Patient states her symptoms started on January 14, she went to her PCP 2 days ago, had a negative flu test, Covid test is pending, was given prescription for Zofran.  Patient denies abdominal pain, chest pain, extremity swelling, history of CHF, asthma, chronic lung disease medicine non-smoker.  No other complaints or concerns.  Linda [name] was evaluated in Emergency Department on 12/08/2020 for the symptoms described in the history of present illness. She was evaluated in the context of the global COVID-19 pandemic, which necessitated consideration that the patient might be at risk for infection with the SARS-CoV-2 virus that causes COVID-19. Institutional protocols and algorithms that pertain to the evaluation of patients at risk for COVID-19 are in a state of rapid change based on information released by regulatory bodies including the CDC and federal and state organizations. These policies and algorithms were followed during the patient's care in the ED.         Past Medical History:  Diagnosis Date  . Cholelithiasis 09/25/2016  . Complication of anesthesia    was told by anesthesia in 1970's to watch her heart during surgery following a knee surgery; but ha shad no problems with surgeries afterwards  . Diverticulitis   . Kidney stone   . Kidney stones   . Rosacea     Patient Active Problem List   Diagnosis Date Noted  . Fatty liver 01/25/2020  . Elevated liver function tests 01/25/2020  . Ureteral stone with hydronephrosis 01/22/2019  .  Diverticular disease s/p robotic sigmoid colectomy 04/08/2017 04/08/2017  . Kidney stone 09/25/2016  . Diverticulosis of colon 09/25/2016  . Hydronephrosis of left kidney 09/25/2016  . Cholelithiasis 09/25/2016  . Atherosclerosis of abdominal aorta ([place]) 09/25/2016  . Renal colic on left side [id number]    Past Surgical History:  Procedure Laterality Date  . CESAREAN SECTION    . COLON SURGERY     for diverticulitis  . CYSTOSCOPY W/ URETERAL STENT PLACEMENT Left 01/23/2019   Procedure: CYSTOSCOPY WITH RETROGRADE PYELOGRAM/URETERAL STENT PLACEMENT,LASER uteroscopy;  Surgeon: Alexis Frock, MD;  Location: [facility];  Service: Urology;  Laterality: Left;  . HEMORRHOID SURGERY    . KNEE SURGERY Right    x3  . lithotripsy   09/2016  . PARTIAL HYSTERECTOMY    . TOTAL SHOULDER REPLACEMENT Left 07/13/2010     OB History   No obstetric history on file.     Family History  Problem Relation Age of Onset  . Nephrolithiasis Other   . Heart disease Father   . Colon cancer Neg Hx   . Colon polyps Neg Hx   . Esophageal cancer Neg Hx   . Stomach cancer Neg Hx   . Liver disease Neg Hx   . Pancreatic cancer Neg Hx     Social History   Tobacco Use  . Smoking status: Former Smoker    Years: 7.00  . Smokeless tobacco: Never Used  Vaping Use  . Vaping Use: Never used  Substance Use Topics  . Alcohol use: No    Alcohol/week: 0.0 standard drinks  .  Drug use: No    Home Medications Prior to Admission medications   Medication Sig Start Date End Date Taking? Authorizing Provider  Multiple Vitamins-Minerals (CENTRUM ADULTS PO) Take 1 tablet by mouth daily.    [provider]    Allergies    Ciprofloxacin, Codeine, Naproxen, Nitrofurantoin monohyd macro, and Penicillins  Review of Systems   Review of Systems  Constitutional: Positive for fatigue and fever.  HENT: Negative for congestion.   Respiratory: Positive for cough and shortness of breath.   Cardiovascular: Negative  for chest pain and leg swelling.  Gastrointestinal: Positive for diarrhea, nausea and vomiting. Negative for abdominal pain, blood in stool and constipation.  Genitourinary: Negative for dysuria and frequency.  Musculoskeletal: Positive for arthralgias and myalgias.  Skin: Negative for rash and wound.  Allergic/Immunologic: Negative for immunocompromised state.  Neurological: Positive for weakness and headaches.  Hematological: Negative for adenopathy.  Psychiatric/Behavioral: Negative for confusion.  All other systems reviewed and are negative.   Physical Exam Updated Vital Signs BP 133/73   Pulse 91   Temp 98.4 F (36.9 C) (Oral)   Resp 15   SpO2 97%   Physical Exam Vitals and nursing note reviewed.  Constitutional:      General: She is not in acute distress.    Appearance: She is well-developed and well-nourished. She is not diaphoretic.     Comments: Appears generally weak and feeling unwell although nontoxic and not in distress.  HENT:     Head: Normocephalic and atraumatic.     Mouth/Throat:     Mouth: Mucous membranes are dry.  Eyes:     Conjunctiva/sclera: Conjunctivae normal.  Cardiovascular:     Rate and Rhythm: Regular rhythm. Tachycardia present.     Pulses: Normal pulses.     Heart sounds: Normal heart sounds. No murmur heard.   Pulmonary:     Effort: Pulmonary effort is normal.     Breath sounds: Normal breath sounds.  Abdominal:     Palpations: Abdomen is soft.     Tenderness: There is no abdominal tenderness.  Musculoskeletal:     Cervical back: Neck supple.     Right lower leg: No edema.     Left lower leg: No edema.  Skin:    General: Skin is warm and dry.     Findings: No erythema or rash.  Neurological:     Mental Status: She is alert and oriented to person, place, and time.     Motor: No weakness.  Psychiatric:        Mood and Affect: Mood and affect normal.        Behavior: Behavior normal.     ED Results / Procedures / Treatments    Labs (all labs ordered are listed, but only abnormal results are displayed) Labs Reviewed  SARS CORONAVIRUS 2 BY RT PCR (HOSPITAL ORDER, [facility]) - Abnormal; Notable for the following components:      Result Value   SARS Coronavirus 2 POSITIVE (*)    All other components within normal limits  COMPREHENSIVE METABOLIC PANEL - Abnormal; Notable for the following components:   Potassium 3.2 (*)    Glucose, Bld 129 (*)    Creatinine, Ser 1.04 (*)    AST 121 (*)    ALT 100 (*)    Total Bilirubin 1.5 (*)    GFR, Estimated 56 (*)    All other components within normal limits  CBC WITH DIFFERENTIAL/PLATELET - Abnormal; Notable for the  following components:   Hemoglobin 15.6 (*)    Platelets 149 (*)    All other components within normal limits  LACTIC ACID, PLASMA - Abnormal; Notable for the following components:   Lactic Acid, Venous 2.0 (*)    All other components within normal limits  CULTURE, BLOOD (ROUTINE X 2)  CULTURE, BLOOD (ROUTINE X 2)  URINE CULTURE  LACTIC ACID, PLASMA  URINALYSIS, ROUTINE W REFLEX MICROSCOPIC  D-DIMER, QUANTITATIVE (NOT AT [facility])  PROCALCITONIN  LACTATE DEHYDROGENASE  FERRITIN  TRIGLYCERIDES  FIBRINOGEN  C-REACTIVE PROTEIN    EKG EKG Interpretation  Date/Time:  Saturday December 08 2020 15:10:14 EST Ventricular Rate:  94 PR Interval:    QRS Duration: 82 QT Interval:  302 QTC Calculation: 378 R Axis:   34 Text Interpretation: Sinus rhythm Abnormal R-wave progression, early transition Nonspecific T abnormalities, anterior leads T wave inversions anteriorly Confirmed by Malvin Johns [phone]) on 12/08/2020 3:16:42 PM   Radiology DG Chest [place] 1 View  Result Date: 12/08/2020 CLINICAL DATA:  Shortness of breath, fever, productive cough, weakness and headache. EXAM: PORTABLE CHEST 1 VIEW COMPARISON:  07/13/2010 FINDINGS: Poor depth of inspiration. Mildly enlarged cardiac silhouette. Tortuous and partially calcified  thoracic aorta. Minimal ill-defined increased density at both lung bases compatible with atelectasis. Small amount of patchy density in the left mid to lower lung zone laterally. Diffuse osteopenia. Left humeral head prosthesis. IMPRESSION: 1. Mild left mid to lower lung zone pneumonia laterally. 2. Poor inspiration with minimal bibasilar atelectasis. 3. Mild cardiomegaly. Electronically Signed   By: Claudie Revering M.D.   On: 12/08/2020 15:44    Procedures Procedures   Medications Ordered in ED Medications  dexamethasone (DECADRON) injection 6 mg (has no administration in time range)  potassium chloride SA (KLOR-CON) CR tablet 40 mEq (40 mEq Oral Given 12/08/20 1646)    ED Course  I have reviewed the triage vital signs and the nursing notes.  Pertinent labs & imaging results that were available during my care of the patient were reviewed by me and considered in my medical decision making (see chart for details).  Clinical Course as of 12/08/20 1651  Sat Dec 08, 5420  [id number] 75 year old female with reported illness x2 weeks.  On exam appears generally weak and feeling unwell although nontoxic and nondistressed at this time.  Patient was ambulated from wheelchair to the bed, short distance, O2 sats dropped to 87%.  Patient is resting at 95%, was placed on nasal cannula.  Abdomen is soft and nontender, no lower extremity edema.  Differential includes but not limited to Covid, pneumonia, dehydration, diverticulitis. [LM]  1624 Patient is COVID positive, additional labs added for planned admission, hospitalist paged for consult, decadron ordered due to O2 requirement, will discuss Remdisivir with hospitalist.  CBC with normal WBC, mild thrombocytopenia at 149. CMP with mild hypokalemia at 3.2, given oral K, AST/ALT elevated, suspect due to COVID.  Lactic acid elevated at 2.0, will hold fluids, COVID +. CXR with bilateral atelectasis, question left lower PNA, will discuss abx with hospitalist.  [LM]   1645 Case discussed with Dr. Florene Glen with [facility] who accepts patient for admission. Plan is to hold on antibiotics pending procalcitonin, will hold on remdesivir for now, admitting team to decide on arrival. [LM]    Clinical Course User Index [LM] Roque Lias   MDM Rules/Calculators/A&P  Final Clinical Impression(s) / ED Diagnoses Final diagnoses:  COVID-19  Hypoxia  Hypokalemia    Rx / DC Orders ED Discharge Orders    None       Tacy Learn, PA-C 12/08/20 1651    Malvin Johns, MD 12/08/20 1718

## 2020-12-08 NOTE — Plan of Care (Signed)

## 2020-12-08 NOTE — Hospital Course (Signed)
f °

## 2020-12-08 NOTE — Progress Notes (Signed)
75 yo presenting to ED with about 2 weeks of malaise.  She's COVID + and unvaccinated.  Had mild desaturation with ambulation.  CXR with mild left mid to lower lung zone pneumonia laterally.  Labs notable for hypokalemia, AKI, elevated LFT's and mildly elevated bili.  Lactic acid is elevated.  Hb is elevated and platelets mildly decreased.  Pending procal, fibrinogen, ferritin, LDH, etc.  Blood and urine cx pending as well.  Vitals notable for satting 98% on 2 L.  Afebrile, normal HR and RR.  BP wnl.  Will plan to admit to observation for covid pneumonia.  Given L sided pneumonia, would have low threshold for abx if procalcitonin elevated (but will hold off now as afebrile, normal WBC, and covid positive).  ED giving dexamethasone.  Will ask for gentle IVF with AKI, mildly elevated lactate.  Will defer remdesivir to admitting provider given >10 days since symptom onset.   Admit to obs tele.

## 2020-12-08 NOTE — H&P (Signed)
History and Physical   [name] [id number] DOB: 12/17/1945 DOA: 12/08/2020  Referring MD/NP/PA: Dr. Tamera Punt  PCP: Leonides Sake, MD   Outpatient Specialists: None  Patient coming from: Home via [facility] [place] [facility]  Chief Complaint: Nausea with vomiting after Covid infection  HPI: Linda [name] is a 75 y.o. female with medical history significant of cholelithiasis, diverticulitis and and kidney stones who was not vaccinated against COVID-19 presenting with about a week of weakness nausea vomiting and diarrhea.  Also has cough fever headaches and body aches.  Symptoms have been going on for about 2 weeks now.  She saw her PCP 2 days ago.  Patient had negative flu test.  Nausea vomiting.  Patient denied abdominal pain.  Was seen in the ER today after 1 week of isolation.  She was tested today in the ER and found to be COVID-19 positive.  Patient has no significant respiratory symptoms and was admitted to the hospital for further evaluation and treatment.  She is having more GI symptoms with no respiratory symptoms.  Patient is being admitted for further treatment..  ED Course: Temperature 98 for blood pressure 145/81 pulse 100 respirate 20 oxygen sat 89% room air 98% on 2 L.  White count 7.0 hemoglobin 15.6 and platelets 149.  Sodium 136 potassium 3.2 chloride 104 CO2 23 BUN 19 creatinine 1.04.  LFTs elevated his AST 121 ALT 100.  Total bili 1.5.  Lactic acid 2.0, LDH 423 triglyceride 147.  Ferritin 2997 and CRP 3.6.  Procalcitonin 0.13.  Chest x-ray showed mild left mid to lower zone pneumonia bilaterally.  There is poor inspiration with minimal basal atelectasis and mild cardiomegaly.  Patient admitted with a diagnosis of COVID-19 infection with mild hypoxemia but mainly GI symptoms.  Review of Systems: As per HPI otherwise 10 point review of systems negative.    Past Medical History:  Diagnosis Date  . Cholelithiasis 09/25/2016  . Complication of anesthesia    was told by  anesthesia in 1970's to watch her heart during surgery following a knee surgery; but ha shad no problems with surgeries afterwards  . Diverticulitis   . Kidney stone   . Kidney stones   . Rosacea     Past Surgical History:  Procedure Laterality Date  . CESAREAN SECTION    . COLON SURGERY     for diverticulitis  . CYSTOSCOPY W/ URETERAL STENT PLACEMENT Left 01/23/2019   Procedure: CYSTOSCOPY WITH RETROGRADE PYELOGRAM/URETERAL STENT PLACEMENT,LASER uteroscopy;  Surgeon: Alexis Frock, MD;  Location: [facility];  Service: Urology;  Laterality: Left;  . HEMORRHOID SURGERY    . KNEE SURGERY Right    x3  . lithotripsy   09/2016  . PARTIAL HYSTERECTOMY    . TOTAL SHOULDER REPLACEMENT Left 07/13/2010     reports that she has quit smoking. She quit after 7.00 years of use. She has never used smokeless tobacco. She reports that she does not drink alcohol and does not use drugs.  Allergies  Allergen Reactions  . Ciprofloxacin Itching  . Codeine Nausea Only  . Naproxen Hives and Itching  . Nitrofurantoin Monohyd Macro Rash  . Penicillins Rash and Other (See Comments)    Has patient had a PCN reaction causing immediate rash, facial/tongue/throat swelling, SOB or lightheadedness with hypotension: No Has patient had a PCN reaction causing severe rash involving mucus membranes or skin necrosis: Yes Has patient had a PCN reaction that required hospitalization No Has patient had a PCN reaction occurring within  the last 10 years: No If all of the above answers are "NO", then may proceed with Cephalosporin use.     Family History  Problem Relation Age of Onset  . Nephrolithiasis Other   . Heart disease Father   . Colon cancer Neg Hx   . Colon polyps Neg Hx   . Esophageal cancer Neg Hx   . Stomach cancer Neg Hx   . Liver disease Neg Hx   . Pancreatic cancer Neg Hx      Prior to Admission medications   Medication Sig Start Date End Date Taking? Authorizing Provider  Multiple  Vitamins-Minerals (CENTRUM ADULTS PO) Take 1 tablet by mouth daily.    [provider]    Physical Exam: Vitals:   12/08/20 1510 12/08/20 1630 12/08/20 1700 12/08/20 1940  BP:  133/73 132/73 134/81  Pulse: 94 91 90 92  Resp: 13 15 13 20   Temp:    98.3 F (36.8 C)  TempSrc:    Oral  SpO2: 98% 97% 98% 95%      Constitutional: Acutely ill looking, weak, no distress  Vitals:   12/08/20 1510 12/08/20 1630 12/08/20 1700 12/08/20 1940  BP:  133/73 132/73 134/81  Pulse: 94 91 90 92  Resp: 13 15 13 20   Temp:    98.3 F (36.8 C)  TempSrc:    Oral  SpO2: 98% 97% 98% 95%   Eyes: PERRL, lids and conjunctivae normal ENMT: Mucous membranes are dry. Posterior pharynx clear of any exudate or lesions.Normal dentition.  Neck: normal, supple, no masses, no thyromegaly Respiratory: clear to auscultation bilaterally, no wheezing, no crackles. Normal respiratory effort. No accessory muscle use.  Cardiovascular: Regular rate and rhythm, no murmurs / rubs / gallops. No extremity edema. 2+ pedal pulses. No carotid bruits.  Abdomen: no tenderness, no masses palpated. No hepatosplenomegaly. Bowel sounds positive.  Musculoskeletal: no clubbing / cyanosis. No joint deformity upper and lower extremities. Good ROM, no contractures. Normal muscle tone.  Skin: no rashes, lesions, ulcers. No induration Neurologic: CN 2-12 grossly intact. Sensation intact, DTR normal. Strength 5/5 in all 4.  Psychiatric: Normal judgment and insight. Alert and oriented x 3. Normal mood.     Labs on Admission: I have personally reviewed following labs and imaging studies  CBC: Recent Labs  Lab 12/08/20 1526  WBC 7.0  NEUTROABS 4.7  HGB 15.6*  HCT 43.6  MCV 91.2  PLT 194*   Basic Metabolic Panel: Recent Labs  Lab 12/08/20 1526  NA 136  K 3.2*  CL 101  CO2 23  GLUCOSE 129*  BUN 19  CREATININE 1.04*  CALCIUM 9.3   GFR: CrCl cannot be calculated (Unknown ideal weight.). Liver Function  Tests: Recent Labs  Lab 12/08/20 1526  AST 121*  ALT 100*  ALKPHOS 52  BILITOT 1.5*  PROT 6.9  ALBUMIN 3.8   No results for input(s): LIPASE, AMYLASE in the last 168 hours. No results for input(s): AMMONIA in the last 168 hours. Coagulation Profile: No results for input(s): INR, PROTIME in the last 168 hours. Cardiac Enzymes: No results for input(s): CKTOTAL, CKMB, CKMBINDEX, TROPONINI in the last 168 hours. BNP (last 3 results) No results for input(s): PROBNP in the last 8760 hours. HbA1C: No results for input(s): HGBA1C in the last 72 hours. CBG: No results for input(s): GLUCAP in the last 168 hours. Lipid Profile: Recent Labs    12/08/20 1643  TRIG 147   Thyroid Function Tests: No results for input(s): TSH, T4TOTAL, FREET4,  T3FREE, THYROIDAB in the last 72 hours. Anemia Panel: Recent Labs    12/08/20 1643  FERRITIN 2,997*   Urine analysis:    Component Value Date/Time   COLORURINE YELLOW 01/22/2019 1235   APPEARANCEUR HAZY (A) 01/22/2019 1235   LABSPEC 1.025 01/22/2019 1235   PHURINE 6.0 01/22/2019 1235   GLUCOSEU NEGATIVE 01/22/2019 1235   HGBUR LARGE (A) 01/22/2019 1235   BILIRUBINUR SMALL (A) 01/22/2019 1235   KETONESUR NEGATIVE 01/22/2019 1235   PROTEINUR NEGATIVE 01/22/2019 1235   UROBILINOGEN 0.2 12/01/2011 0913   NITRITE NEGATIVE 01/22/2019 1235   LEUKOCYTESUR MODERATE (A) 01/22/2019 1235   Sepsis Labs: @LABRCNTIP (procalcitonin:4,lacticidven:4) ) Recent Results (from the past 240 hour(s))  SARS Coronavirus 2 by RT PCR (hospital order, performed in [place] hospital lab) Nasopharyngeal Nasopharyngeal Swab     Status: Abnormal   Collection Time: 12/08/20  3:09 PM   Specimen: Nasopharyngeal Swab  Result Value Ref Range Status   SARS Coronavirus 2 POSITIVE (A) NEGATIVE Final    Comment: RESULT CALLED TO, READ BACK BY AND VERIFIED WITH: [name] RN ON 12/08/20 AT 1619 [place] (NOTE) SARS-CoV-2 target nucleic acids are DETECTED  SARS-CoV-2 RNA is  generally detectable in upper respiratory specimens  during the acute phase of infection.  Positive results are indicative  of the presence of the identified virus, but do not rule out bacterial infection or co-infection with other pathogens not detected by the test.  Clinical correlation with patient history and  other diagnostic information is necessary to determine patient infection status.  The expected result is negative.  Fact Sheet for Patients:   StrictlyIdeas.no   Fact Sheet for Healthcare Providers:   BankingDealers.co.za    This test is not yet approved or cleared by the [place] FDA and  has been authorized for detection and/or diagnosis of SARS-CoV-2 by FDA under an Emergency Use Authorization (EUA).  This EUA will remain in effect (meaning this  test can be used) for the duration of  the COVID-19 declaration under Section 564(b)(1) of the Act, 21 U.S.C. section 360-bbb-3(b)(1), unless the authorization is terminated or revoked sooner.  Performed at [facility], [address] [place], [place] [address]      Radiological Exams on Admission: DG Chest [facility] 1 View  Result Date: 12/08/2020 CLINICAL DATA:  Shortness of breath, fever, productive cough, weakness and headache. EXAM: PORTABLE CHEST 1 VIEW COMPARISON:  07/13/2010 FINDINGS: Poor depth of inspiration. Mildly enlarged cardiac silhouette. Tortuous and partially calcified thoracic aorta. Minimal ill-defined increased density at both lung bases compatible with atelectasis. Small amount of patchy density in the left mid to lower lung zone laterally. Diffuse osteopenia. Left humeral head prosthesis. IMPRESSION: 1. Mild left mid to lower lung zone pneumonia laterally. 2. Poor inspiration with minimal bibasilar atelectasis. 3. Mild cardiomegaly. Electronically Signed   By: Claudie Revering M.D.   On: 12/08/2020 15:44      Assessment/Plan Principal Problem:    COVID-19 Active Problems:   Atherosclerosis of abdominal aorta (HCC)   Hypokalemia     #1  COVID-19 infection: Patient is having more GI symptoms but has some mild hypoxia and pneumonia on x-rays.  We will admit and initiate remdesivir, dexamethasone, IV fluids and IV antibiotics.  Continue supportive care  #2  Hypokalemia: Continue to replete potassium.  #3 abdominal aorta: Stable.  Continue to monitor  #4 nausea vomiting and diarrhea: Hydrate.  Symptomatic management   DVT prophylaxis: Lovenox Code Status: Full code Family Communication: No  family at bedside Disposition Plan: Home Consults called: None Admission status: Inpatient  Severity of Illness: The appropriate patient status for this patient is INPATIENT. Inpatient status is judged to be reasonable and necessary in order to provide the required intensity of service to ensure the patient's safety. The patient's presenting symptoms, physical exam findings, and initial radiographic and laboratory data in the context of their chronic comorbidities is felt to place them at high risk for further clinical deterioration. Furthermore, it is not anticipated that the patient will be medically stable for discharge from the hospital within 2 midnights of admission. The following factors support the patient status of inpatient.   " The patient's presenting symptoms include nausea vomiting and diarrhea with weakness. " The worrisome physical exam findings include dry mucous membranes. " The initial radiographic and laboratory data are worrisome because of evidence of COVID-19 infection. " The chronic co-morbidities include hypertension.   * I certify that at the point of admission it is my clinical judgment that the patient will require inpatient hospital care spanning beyond 2 midnights from the point of admission due to high intensity of service, high risk for further deterioration and high frequency of surveillance  required.Barbette Merino MD [facility] Pager [phone]  If 7PM-7AM, please contact night-coverage [email or web address] Password [facility]  12/08/2020, 8:41 PM

## 2020-12-09 DIAGNOSIS — E876 Hypokalemia: Secondary | ICD-10-CM | POA: Diagnosis present

## 2020-12-09 DIAGNOSIS — Z87891 Personal history of nicotine dependence: Secondary | ICD-10-CM | POA: Diagnosis not present

## 2020-12-09 DIAGNOSIS — Z96612 Presence of left artificial shoulder joint: Secondary | ICD-10-CM | POA: Diagnosis present

## 2020-12-09 DIAGNOSIS — U071 COVID-19: Secondary | ICD-10-CM | POA: Diagnosis present

## 2020-12-09 DIAGNOSIS — I10 Essential (primary) hypertension: Secondary | ICD-10-CM | POA: Diagnosis present

## 2020-12-09 DIAGNOSIS — Z90710 Acquired absence of both cervix and uterus: Secondary | ICD-10-CM | POA: Diagnosis not present

## 2020-12-09 DIAGNOSIS — J1282 Pneumonia due to coronavirus disease 2019: Secondary | ICD-10-CM | POA: Diagnosis present

## 2020-12-09 DIAGNOSIS — K760 Fatty (change of) liver, not elsewhere classified: Secondary | ICD-10-CM | POA: Diagnosis present

## 2020-12-09 DIAGNOSIS — Z87442 Personal history of urinary calculi: Secondary | ICD-10-CM | POA: Diagnosis not present

## 2020-12-09 DIAGNOSIS — J9601 Acute respiratory failure with hypoxia: Secondary | ICD-10-CM | POA: Diagnosis present

## 2020-12-09 DIAGNOSIS — I700 Atherosclerosis of aorta: Secondary | ICD-10-CM | POA: Diagnosis present

## 2020-12-09 DIAGNOSIS — Z880 Allergy status to penicillin: Secondary | ICD-10-CM | POA: Diagnosis not present

## 2020-12-09 DIAGNOSIS — Z888 Allergy status to other drugs, medicaments and biological substances status: Secondary | ICD-10-CM | POA: Diagnosis not present

## 2020-12-09 DIAGNOSIS — Z8249 Family history of ischemic heart disease and other diseases of the circulatory system: Secondary | ICD-10-CM | POA: Diagnosis not present

## 2020-12-09 DIAGNOSIS — R197 Diarrhea, unspecified: Secondary | ICD-10-CM | POA: Diagnosis present

## 2020-12-09 DIAGNOSIS — N179 Acute kidney failure, unspecified: Secondary | ICD-10-CM | POA: Diagnosis present

## 2020-12-09 DIAGNOSIS — R112 Nausea with vomiting, unspecified: Secondary | ICD-10-CM | POA: Diagnosis present

## 2020-12-09 DIAGNOSIS — D696 Thrombocytopenia, unspecified: Secondary | ICD-10-CM | POA: Diagnosis present

## 2020-12-09 DIAGNOSIS — Z885 Allergy status to narcotic agent status: Secondary | ICD-10-CM | POA: Diagnosis not present

## 2020-12-09 DIAGNOSIS — Z79899 Other long term (current) drug therapy: Secondary | ICD-10-CM | POA: Diagnosis not present

## 2020-12-09 LAB — COMPREHENSIVE METABOLIC PANEL
ALT: 87 U/L — ABNORMAL HIGH (ref 0–44)
AST: 85 U/L — ABNORMAL HIGH (ref 15–41)
Albumin: 3.5 g/dL (ref 3.5–5.0)
Alkaline Phosphatase: 50 U/L (ref 38–126)
Anion gap: 10 (ref 5–15)
BUN: 18 mg/dL (ref 8–23)
CO2: 20 mmol/L — ABNORMAL LOW (ref 22–32)
Calcium: 9 mg/dL (ref 8.9–10.3)
Chloride: 109 mmol/L (ref 98–111)
Creatinine, Ser: 0.76 mg/dL (ref 0.44–1.00)
GFR, Estimated: >60 mL/min (ref >60)
Glucose, Bld: 154 mg/dL — ABNORMAL HIGH (ref 70–99)
Potassium: 3.4 mmol/L — ABNORMAL LOW (ref 3.5–5.1)
Sodium: 139 mmol/L (ref 135–145)
Total Bilirubin: 1.1 mg/dL (ref 0.3–1.2)
Total Protein: 6.6 g/dL (ref 6.5–8.1)

## 2020-12-09 LAB — CBC WITH DIFFERENTIAL/PLATELET
Abs Immature Granulocytes: 0.03 10*3/uL (ref 0.00–0.07)
Basophils Absolute: 0 10*3/uL (ref 0.0–0.1)
Basophils Relative: 0 %
Eosinophils Absolute: 0 10*3/uL (ref 0.0–0.5)
Eosinophils Relative: 0 %
HCT: 44.6 % (ref 36.0–46.0)
Hemoglobin: 15.5 g/dL — ABNORMAL HIGH (ref 12.0–15.0)
Immature Granulocytes: 1 %
Lymphocytes Relative: 17 %
Lymphs Abs: 1 10*3/uL (ref 0.7–4.0)
MCH: 32.6 pg (ref 26.0–34.0)
MCHC: 34.8 g/dL (ref 30.0–36.0)
MCV: 93.9 fL (ref 80.0–100.0)
Monocytes Absolute: 0.3 10*3/uL (ref 0.1–1.0)
Monocytes Relative: 6 %
Neutro Abs: 4.2 10*3/uL (ref 1.7–7.7)
Neutrophils Relative %: 76 %
Platelets: 155 10*3/uL (ref 150–400)
RBC: 4.75 MIL/uL (ref 3.87–5.11)
RDW: 13.3 % (ref 11.5–15.5)
WBC: 5.6 10*3/uL (ref 4.0–10.5)
nRBC: 0 % (ref 0.0–0.2)

## 2020-12-09 LAB — MAGNESIUM: Magnesium: 2 mg/dL (ref 1.7–2.4)

## 2020-12-09 LAB — HEPATITIS PANEL, ACUTE
HCV Ab: NONREACTIVE
Hep A IgM: NONREACTIVE
Hep B C IgM: NONREACTIVE
Hepatitis B Surface Ag: NONREACTIVE

## 2020-12-09 LAB — FERRITIN: Ferritin: 2406 ng/mL — ABNORMAL HIGH (ref 11–307)

## 2020-12-09 LAB — C-REACTIVE PROTEIN: CRP: 3.9 mg/dL — ABNORMAL HIGH (ref <1.0)

## 2020-12-09 LAB — PHOSPHORUS: Phosphorus: 2.7 mg/dL (ref 2.5–4.6)

## 2020-12-09 LAB — C DIFFICILE QUICK SCREEN W PCR REFLEX
C Diff antigen: NEGATIVE
C Diff interpretation: NOT DETECTED
C Diff toxin: NEGATIVE

## 2020-12-09 LAB — D-DIMER, QUANTITATIVE: D-Dimer, Quant: 1 ug/mL-FEU — ABNORMAL HIGH (ref 0.00–0.50)

## 2020-12-09 MED ORDER — LOPERAMIDE HCL 2 MG PO CAPS: 2.0000 mg | ORAL_CAPSULE | ORAL | Status: DC | PRN

## 2020-12-09 MED ORDER — POTASSIUM CHLORIDE CRYS ER 20 MEQ PO TBCR
40.0000 meq | EXTENDED_RELEASE_TABLET | Freq: Once | ORAL | Status: AC
  Administered 2020-12-09: 40 meq via ORAL
  Filled 2020-12-09: qty 2

## 2020-12-09 MED ORDER — LACTATED RINGERS IV SOLN: INTRAVENOUS | Status: AC

## 2020-12-09 NOTE — Progress Notes (Signed)
Patient is extremely weak and out of breath. Unable to complete o2 eval. Pt 93% on 2L, 90% at rest on RA.

## 2020-12-09 NOTE — Progress Notes (Addendum)
PROGRESS NOTE    Linda [name]  [id number] DOB: 1946-04-12 DOA: 12/08/2020 PCP: Leonides Sake, MD  Chief Complaint  Patient presents with  . Fever    headache    Brief Narrative:  Linda [name] is Linda [name] 75 y.o. female with medical history significant of cholelithiasis, diverticulitis and and kidney stones who was not vaccinated against COVID-19 presenting with about Linda [name] week of weakness nausea vomiting and diarrhea.  Also has cough fever headaches and body aches.  Symptoms have been going on for about 2 weeks now.  She saw her PCP 2 days ago.  Patient had negative flu test.  Nausea vomiting.  Patient denied abdominal pain.  Was seen in the ER today after 1 week of isolation.  She was tested today in the ER and found to be COVID-19 positive.  Patient has no significant respiratory symptoms and was admitted to the hospital for further evaluation and treatment.  She is having more GI symptoms with no respiratory symptoms.  Patient is being admitted for further treatment..  Assessment & Plan:   Principal Problem:   COVID-19 Active Problems:   Atherosclerosis of abdominal aorta (HCC)   Hypokalemia  Generalized Malaise and Weakness  Nausea  Vomiting  Diarrhea  Decreased Appetite 2/2 COVID - primarily with GI symptoms Continue zofran prn  Follow c diff -> start antidiarrheal if negative Continue MIVF and follow  Continues to have extremely poor PO intake  Acute Hypoxic Respiratory Failure 2/2 COVID 19 Pneumonia  Covid 19 Virus Infection Mild O2 requirement, desat with ambulation, now on RA at rest, but with severe dyspnea on exertion CXR with mild L mid to lower lung zone pneumonia Procal not impressive, will hold off on abx Follow repeat CXR 12/10/2020 Continue steroids (1/30 - present), remdesivir (1/30-present) Strict I/O, daily weights  COVID-19 Labs  Recent Labs    12/08/20 1527 12/08/20 1643 12/09/20 0332  DDIMER 1.26*  --  1.00*  FERRITIN  --  2,997* 2,406*   LDH  --  423*  --   CRP  --  3.6* 3.9*    Lab Results  Component Value Date   SARSCOV2NAA POSITIVE (Linda [name]) 12/08/2020   Elevated LFTs Improving, 2/2 covid infection - follow acute hepatitis panel  Hx Diverticulitis Hx Cholelithiasis  DVT prophylaxis: lovenox Code Status: full  Family Communication: none at bedside Disposition:   Status is: Observation  The patient will require care spanning > 2 midnights and should be moved to inpatient because: Inpatient level of care appropriate due to severity of illness  Dispo: The patient is from: Home              Anticipated d/c is to: Home              Anticipated d/c date is: 2 days              Patient currently is not medically stable to d/c.   Difficult to place patient No       Consultants:   none  Procedures:  none  Antimicrobials:  Anti-infectives (From admission, onward)   Start     Dose/Rate Route Frequency Ordered Stop   12/09/20 1000  remdesivir 100 mg in sodium chloride 0.9 % 100 mL IVPB  Status:  Discontinued       "Followed by" Linked Group Details   100 mg 200 mL/hr over 30 Minutes Intravenous Daily 12/08/20 2102 12/08/20 2105   12/09/20 1000  remdesivir 100 mg in sodium chloride 0.9 %  100 mL IVPB       "Followed by" Linked Group Details   100 mg 200 mL/hr over 30 Minutes Intravenous Daily 12/08/20 2049 12/13/20 0959   12/08/20 2200  remdesivir 200 mg in sodium chloride 0.9% 250 mL IVPB       "Followed by" Linked Group Details   200 mg 580 mL/hr over 30 Minutes Intravenous Once 12/08/20 2049 12/08/20 2300   12/08/20 2130  remdesivir 200 mg in sodium chloride 0.9% 250 mL IVPB  Status:  Discontinued       "Followed by" Linked Group Details   200 mg 580 mL/hr over 30 Minutes Intravenous Once 12/08/20 2102 12/08/20 2105         Subjective: C/o generalized malaise Diarrhea, nausea Decreased appetite - going to try for breakfast  Objective: Vitals:   12/08/20 1700 12/08/20 1940 12/08/20 2345  12/09/20 0836  BP: 132/73 134/81 131/78 124/72  Pulse: 90 92 95 74  Resp: 13 20 16 20   Temp:  98.3 F (36.8 C) 98 F (36.7 C) 97.8 F (36.6 C)  TempSrc:  Oral Oral Oral  SpO2: 98% 95% 94% 94%  Weight:  78 kg    Height:  5\' 2"  (1.575 m)      Intake/Output Summary (Last 24 hours) at 12/09/2020 0920 Last data filed at 12/09/2020 [id number] Gross per 24 hour  Intake 310 ml  Output -  Net 310 ml   Filed Weights   12/08/20 1940  Weight: 78 kg    Examination:  General exam: Appears calm and comfortable  Respiratory system: unlabored Cardiovascular system: RRR Gastrointestinal system: Abdomen is nondistended, soft and mildly tender Central nervous system: Alert and oriented. No focal neurological deficits. Extremities: trace edema Skin: No rashes, lesions or ulcers Psychiatry: Judgement and insight appear normal. Mood & affect appropriate.     Data Reviewed: I have personally reviewed following labs and imaging studies  CBC: Recent Labs  Lab 12/08/20 1526 12/09/20 0332  WBC 7.0 5.6  NEUTROABS 4.7 4.2  HGB 15.6* 15.5*  HCT 43.6 44.6  MCV 91.2 93.9  PLT 149* [id number]    Basic Metabolic Panel: Recent Labs  Lab 12/08/20 1526 12/09/20 0332  NA 136 139  K 3.2* 3.4*  CL 101 109  CO2 23 20*  GLUCOSE 129* 154*  BUN 19 18  CREATININE 1.04* 0.76  CALCIUM 9.3 9.0  MG  --  2.0  PHOS  --  2.7    GFR: Estimated Creatinine Clearance: 59.7 mL/min (by C-G formula based on SCr of 0.76 mg/dL).  Liver Function Tests: Recent Labs  Lab 12/08/20 1526 12/09/20 0332  AST 121* 85*  ALT 100* 87*  ALKPHOS 52 50  BILITOT 1.5* 1.1  PROT 6.9 6.6  ALBUMIN 3.8 3.5    CBG: No results for input(s): GLUCAP in the last 168 hours.   Recent Results (from the past 240 hour(s))  SARS Coronavirus 2 by RT PCR (hospital order, performed in [facility] hospital lab) Nasopharyngeal Nasopharyngeal Swab     Status: Abnormal   Collection Time: 12/08/20  3:09 PM   Specimen: Nasopharyngeal Swab   Result Value Ref Range Status   SARS Coronavirus 2 POSITIVE (Linda [name]) NEGATIVE Final    Comment: RESULT CALLED TO, READ BACK BY AND VERIFIED WITH: [name] RN ON 12/08/20 AT 1619 [place] (NOTE) SARS-CoV-2 target nucleic acids are DETECTED  SARS-CoV-2 RNA is generally detectable in upper respiratory specimens  during the acute phase of infection.  Positive results are indicative  of the presence of the identified virus, but do not rule out bacterial infection or co-infection with other pathogens not detected by the test.  Clinical correlation with patient history and  other diagnostic information is necessary to determine patient infection status.  The expected result is negative.  Fact Sheet for Patients:   StrictlyIdeas.no   Fact Sheet for Healthcare Providers:   BankingDealers.co.za    This test is not yet approved or cleared by the [place] FDA and  has been authorized for detection and/or diagnosis of SARS-CoV-2 by FDA under an Emergency Use Authorization (EUA).  This EUA will remain in effect (meaning this  test can be used) for the duration of  the COVID-19 declaration under Section 564(b)(1) of the Act, 21 U.S.C. section 360-bbb-3(b)(1), unless the authorization is terminated or revoked sooner.  Performed at [facility], [address] [place], [place] [address]   Culture, blood (routine x 2)     Status: None (Preliminary result)   Collection Time: 12/08/20  4:40 PM   Specimen: BLOOD  Result Value Ref Range Status   Specimen Description BLOOD LEFT ANTECUBITAL  Final   Special Requests   Final    BOTTLES DRAWN AEROBIC AND ANAEROBIC Blood Culture results may not be optimal due to an inadequate volume of blood received in culture bottles Performed at [facility] [address], [place], [place] [address]    Culture PENDING  Incomplete   Report Status PENDING  Incomplete         Radiology Studies: DG  Chest Port 1 View  Result Date: 12/08/2020 CLINICAL DATA:  Shortness of breath, fever, productive cough, weakness and headache. EXAM: PORTABLE CHEST 1 VIEW COMPARISON:  07/13/2010 FINDINGS: Poor depth of inspiration. Mildly enlarged cardiac silhouette. Tortuous and partially calcified thoracic aorta. Minimal ill-defined increased density at both lung bases compatible with atelectasis. Small amount of patchy density in the left mid to lower lung zone laterally. Diffuse osteopenia. Left humeral head prosthesis. IMPRESSION: 1. Mild left mid to lower lung zone pneumonia laterally. 2. Poor inspiration with minimal bibasilar atelectasis. 3. Mild cardiomegaly. Electronically Signed   By: Claudie Revering M.D.   On: 12/08/2020 15:44        Scheduled Meds: . vitamin C  500 mg Oral Daily  . dexamethasone  6 mg Oral Q24H  . enoxaparin (LOVENOX) injection  40 mg Subcutaneous Q24H  . Ipratropium-Albuterol  1 puff Inhalation Q6H  . zinc sulfate  220 mg Oral Daily   Continuous Infusions: . remdesivir 100 mg in NS 100 mL 100 mg (12/09/20 0909)     LOS: 0 days    Time spent: over 30 min    Fayrene Helper, MD [facility]   To contact the attending provider between 7A-7P or the covering provider during after hours 7P-7A, please log into the web site [email or web address] and access using universal [place] password for that web site. If you do not have the password, please call the hospital operator.  12/09/2020, 9:20 AM

## 2020-12-09 NOTE — Evaluation (Signed)
Physical Therapy Evaluation Patient Details Name: Linda [name] MRN: [id number] DOB: [birth year] Today's Date: 12/09/2020   History of Present Illness  75 yo female admitted with COVID.  Clinical Impression  On eval, pt was Min guard assist for mobility. She walked ~20 feet with a RW. O2 was 87% on RA while pivoting bsc to bed. O2 was 88% on 2L while ambulating. Pt presents with general weakness, decreased activity tolerance, and impaired gait and balance. Pt reports she lives at home alone. Will plan to follow and progress activity as tolerated. Could possibly need to maximize home health services if pt returns home.     Follow Up Recommendations Home health PT    Equipment Recommendations  Rolling walker with 5" wheels (if pt doesn't already have one)    Recommendations for Other Services       Precautions / Restrictions Precautions Precautions: Fall Precaution Comments: monitor O2 Restrictions Weight Bearing Restrictions: No      Mobility  Bed Mobility Overal bed mobility: Needs Assistance Bed Mobility: Supine to Sit;Sit to Supine     Supine to sit: Min guard;HOB elevated Sit to supine: Min guard;HOB elevated   General bed mobility comments: Min guard for safety. Increased time. Seated rest break sitting EOB before attempting standing.    Transfers Overall transfer level: Needs assistance Equipment used: Rolling walker (2 wheeled);None Transfers: Sit to/from [employer] to Stand: Min guard Stand pivot transfers: Min guard       General transfer comment: Min guard for safety. Cues for safety, hand placement. Increaesed time.  Ambulation/Gait Ambulation/Gait assistance: Min guard Gait Distance (Feet): 20 Feet Assistive device: Rolling walker (2 wheeled) Gait Pattern/deviations: Step-through pattern;Decreased stride length     General Gait Details: Min guard for safety. Slow gait speed. O2 88% on 2L, dyspnea 3/4 + coughing.  Stairs             Wheelchair Mobility    Modified Rankin (Stroke Patients Only)       Balance Overall balance assessment: Needs assistance         Standing balance support: Bilateral upper extremity supported Standing balance-Leahy Scale: Poor                               Pertinent Vitals/Pain Pain Assessment: No/denies pain    Home Living Family/patient expects to be discharged to:: Private residence Living Arrangements: Alone Available Help at Discharge:  (son checks on pt daily) Type of Home: House Home Access: Stairs to enter Entrance Stairs-Rails: None Entrance Stairs-Number of Steps: 3 Home Layout: One level Home Equipment: Environmental consultant - 2 wheels;Cane - single point      Prior Function Level of Independence: Independent               Hand Dominance        Extremity/Trunk Assessment   Upper Extremity Assessment Upper Extremity Assessment: Generalized weakness    Lower Extremity Assessment Lower Extremity Assessment: Generalized weakness    Cervical / Trunk Assessment Cervical / Trunk Assessment: Normal  Communication   Communication: No difficulties  Cognition Arousal/Alertness: Awake/alert Behavior During Therapy: WFL for tasks assessed/performed Overall Cognitive Status: Within Functional Limits for tasks assessed                                        General Comments  Exercises     Assessment/Plan    PT Assessment Patient needs continued PT services  PT Problem List Decreased strength;Decreased mobility;Decreased activity tolerance;Decreased balance;Pain       PT Treatment Interventions DME instruction;Gait training;Therapeutic activities;Therapeutic exercise;Patient/family education;Balance training;Functional mobility training    PT Goals (Current goals can be found in the Care Plan section)  Acute Rehab PT Goals Patient Stated Goal: to get better and regain PLOF PT Goal Formulation: With  patient Time For Goal Achievement: 12/23/20 Potential to Achieve Goals: Good    Frequency Min 3X/week   Barriers to discharge        Co-evaluation               AM-PAC PT "6 Clicks" Mobility  Outcome Measure Help needed turning from your back to your side while in a flat bed without using bedrails?: A Little Help needed moving from lying on your back to sitting on the side of a flat bed without using bedrails?: A Little Help needed moving to and from a bed to a chair (including a wheelchair)?: A Little Help needed standing up from a chair using your arms (e.g., wheelchair or bedside chair)?: A Little Help needed to walk in hospital room?: A Little Help needed climbing 3-5 steps with a railing? : A Little 6 Click Score: 18    End of Session Equipment Utilized During Treatment: Oxygen Activity Tolerance: Patient limited by fatigue Patient left: in bed;with call bell/phone within reach;with bed alarm set   PT Visit Diagnosis: Muscle weakness (generalized) (M62.81);Difficulty in walking, not elsewhere classified (R26.2)    Time: 7867-6720 PT Time Calculation (min) (ACUTE ONLY): 29 min   Charges:   PT Evaluation $PT Eval Moderate Complexity: 1 Mod PT Treatments $Gait Training: 8-22 mins          Doreatha Massed, PT Acute Rehabilitation  Office: [phone] Pager: [phone]

## 2020-12-10 ENCOUNTER — Inpatient Hospital Stay (HOSPITAL_COMMUNITY): Payer: Medicare Other

## 2020-12-10 LAB — CBC WITH DIFFERENTIAL/PLATELET
Abs Immature Granulocytes: 0.05 10*3/uL (ref 0.00–0.07)
Basophils Absolute: 0 10*3/uL (ref 0.0–0.1)
Basophils Relative: 0 %
Eosinophils Absolute: 0 10*3/uL (ref 0.0–0.5)
Eosinophils Relative: 0 %
HCT: 43.3 % (ref 36.0–46.0)
Hemoglobin: 14.8 g/dL (ref 12.0–15.0)
Immature Granulocytes: 1 %
Lymphocytes Relative: 18 %
Lymphs Abs: 1.3 10*3/uL (ref 0.7–4.0)
MCH: 32.2 pg (ref 26.0–34.0)
MCHC: 34.2 g/dL (ref 30.0–36.0)
MCV: 94.3 fL (ref 80.0–100.0)
Monocytes Absolute: 0.5 10*3/uL (ref 0.1–1.0)
Monocytes Relative: 7 %
Neutro Abs: 5.4 10*3/uL (ref 1.7–7.7)
Neutrophils Relative %: 74 %
Platelets: 158 10*3/uL (ref 150–400)
RBC: 4.59 MIL/uL (ref 3.87–5.11)
RDW: 13.2 % (ref 11.5–15.5)
WBC: 7.2 10*3/uL (ref 4.0–10.5)
nRBC: 0 % (ref 0.0–0.2)

## 2020-12-10 LAB — COMPREHENSIVE METABOLIC PANEL
ALT: 65 U/L — ABNORMAL HIGH (ref 0–44)
AST: 54 U/L — ABNORMAL HIGH (ref 15–41)
Albumin: 3.2 g/dL — ABNORMAL LOW (ref 3.5–5.0)
Alkaline Phosphatase: 47 U/L (ref 38–126)
Anion gap: 10 (ref 5–15)
BUN: 17 mg/dL (ref 8–23)
CO2: 20 mmol/L — ABNORMAL LOW (ref 22–32)
Calcium: 9.3 mg/dL (ref 8.9–10.3)
Chloride: 112 mmol/L — ABNORMAL HIGH (ref 98–111)
Creatinine, Ser: 0.73 mg/dL (ref 0.44–1.00)
GFR, Estimated: >60 mL/min (ref >60)
Glucose, Bld: 137 mg/dL — ABNORMAL HIGH (ref 70–99)
Potassium: 4.3 mmol/L (ref 3.5–5.1)
Sodium: 142 mmol/L (ref 135–145)
Total Bilirubin: 1.1 mg/dL (ref 0.3–1.2)
Total Protein: 6 g/dL — ABNORMAL LOW (ref 6.5–8.1)

## 2020-12-10 LAB — PHOSPHORUS: Phosphorus: 2.1 mg/dL — ABNORMAL LOW (ref 2.5–4.6)

## 2020-12-10 LAB — D-DIMER, QUANTITATIVE: D-Dimer, Quant: 0.77 ug/mL-FEU — ABNORMAL HIGH (ref 0.00–0.50)

## 2020-12-10 LAB — MAGNESIUM: Magnesium: 2 mg/dL (ref 1.7–2.4)

## 2020-12-10 LAB — FERRITIN: Ferritin: 1826 ng/mL — ABNORMAL HIGH (ref 11–307)

## 2020-12-10 LAB — C-REACTIVE PROTEIN: CRP: 1.7 mg/dL — ABNORMAL HIGH (ref <1.0)

## 2020-12-10 MED ORDER — DEXAMETHASONE 6 MG PO TABS: 6.0000 mg | ORAL_TABLET | ORAL | 0 refills | Status: AC

## 2020-12-10 NOTE — Progress Notes (Signed)
Went over d/c instructions with patient and patient's son.  Son verbalized understanding of discharge instructions.  Patient refused to take portable oxygen tank home.  MD made aware.  \  Virginia Rochester, RN

## 2020-12-10 NOTE — Plan of Care (Signed)

## 2020-12-10 NOTE — Progress Notes (Signed)
SATURATION QUALIFICATIONS: (This note is used to comply with regulatory documentation for home oxygen)  Patient Saturations on Room Air at Rest = 91%  Patient Saturations on Room Air while Ambulating = 87%  Patient Saturations on 2 Liters of oxygen while Ambulating =94%  Please briefly explain why patient needs home oxygen: Patient has Covid pneumonia.  Virginia Rochester, RN

## 2020-12-10 NOTE — Discharge Summary (Signed)
Physician Discharge Summary  Linda [name] [id number] DOB: [birth year] DOA: 12/08/2020  PCP: Linda Sake, MD  Admit date: 12/08/2020 Discharge date: 12/10/2020  Time spent: 40 minutes  Recommendations for Outpatient Follow-up:  1. Follow outpatient CBC/CMP 2. Follow CXR in 3-4 weeks  3. Follow oxygen requirements outpatient with PCP  4. Discuss vaccination with PCP once recovered  Discharge Diagnoses:  Principal Problem:   COVID-19 Active Problems:   Atherosclerosis of abdominal aorta (HCC)   Hypokalemia   Discharge Condition: stable  Diet recommendation: heart healthy  Filed Weights   12/08/20 1940  Weight: 78 kg    History of present illness:  Linda [name] Linda [name] 75 y.o.femalewith medical history significant ofcholelithiasis, diverticulitis and and kidney stones who was not vaccinated against COVID-19 presenting with about Linda [name] week of weakness nausea vomiting and diarrhea. Also has cough fever headaches and body aches. Symptoms have been going on for about 2 weeks now. She saw her PCP 2 days ago. Patient had negative flu test. Nausea vomiting. Patient denied abdominal pain. Was seen in the ER today after 1 week of isolation. She was tested today in the ER and found to be COVID-19 positive. Patient has no significant respiratory symptoms and was admitted to the hospital for further evaluation and treatment. She is having more GI symptoms with no respiratory symptoms. Patient is being admitted for further treatment..  She was admitted with mild hypoxia and GI symptoms related to covid.  She's improved with steroids and remdesivir.  She's discharging on 1/31 with steroids and supplemental oxygen with activity.  See below for additional details  Hospital Course:  Generalized Malaise and Weakness  Nausea  Vomiting  Diarrhea  Decreased Appetite 2/2 COVID - primarily with GI symptoms Continue zofran prn  Follow c diff negative Symptoms improving,  appetite better today - she's eager to d/c home  Acute Hypoxic Respiratory Failure 2/2 COVID 19 Pneumonia  Covid 19 Virus Infection Mild O2 requirement, desat with ambulation, now on RA at rest, but with severe dyspnea on exertion CXR with mild L mid to lower lung zone pneumonia Procal not impressive, will hold off on abx Follow repeat CXR 12/10/2020 - stable infiltrate in L base Continue steroids (1/30 - present), remdesivir (1/30-1/31 - will d/c remdesivir at discharge - she presented >10 days after symptoms started and not clear remdesivir beneficial in this setting) - d/c with additional 5 days steroids Strict I/O, daily weights  COVID-19 Labs  Recent Labs    12/08/20 1527 12/08/20 1643 12/09/20 0332 12/10/20 0353  DDIMER 1.26*  --  1.00* 0.77*  FERRITIN  --  2,997* 2,406* 1,826*  LDH  --  423*  --   --   CRP  --  3.6* 3.9* 1.7*    Lab Results  Component Value Date   SARSCOV2NAA POSITIVE (Linda [name]) 12/08/2020   Elevated LFTs Improving, 2/2 covid infection - follow acute hepatitis panel  Hx Diverticulitis Hx Cholelithiasis  Procedures:  none  Consultations:  none  Discharge Exam: Vitals:   12/10/20 0442 12/10/20 1400  BP: 126/73 129/83  Pulse: 66 91  Resp: 16 18  Temp: (!) 97.4 F (36.3 C) 98.1 F (36.7 C)  SpO2: 95% 95%   No new complaints today Feels better, ate better Eager to discharge Discussed d/c plan with son as well  General: No acute distress. Cardiovascular: Heart sounds show Linda [name] regular rate, and rhythm.  Lungs: Clear to auscultation bilaterally Abdomen: Soft, nontender, nondistended  Neurological: Alert and oriented 3.  Moves all extremities 4. Cranial nerves II through XII grossly intact. Skin: Warm and dry. No rashes or lesions. Extremities: No clubbing or cyanosis. No edema.   Discharge Instructions   Discharge Instructions    Call MD for:  difficulty breathing, headache or visual disturbances   Complete by: As directed    Call MD  for:  extreme fatigue   Complete by: As directed    Call MD for:  hives   Complete by: As directed    Call MD for:  persistant dizziness or light-headedness   Complete by: As directed    Call MD for:  persistant nausea and vomiting   Complete by: As directed    Call MD for:  redness, tenderness, or signs of infection (pain, swelling, redness, odor or green/yellow discharge around incision site)   Complete by: As directed    Call MD for:  severe uncontrolled pain   Complete by: As directed    Call MD for:  temperature >100.4   Complete by: As directed    Diet - low sodium heart healthy   Complete by: As directed    Discharge instructions   Complete by: As directed    You were seen for covid infection.  You've improved with steroids and remdesivir.  You continue to need oxygen with activity.  We'll discharge you home with 2 L to use with activity and at night.  We'll also send you home with steroids.  You should quarantine until 12/18/20.  After 12/18/20 you can discontinue your isolation.   Follow up with your PCP for repeat labs and to follow up your oxygen requirement outpatient.  You'll need to have Linda [name] follow up chest x ray in Linda [name] few weeks as well.  Return for new, recurrent, or worsening symptoms.  Please ask your PCP to request records from this hospitalization so they know what was done and what the next steps will be.   Increase activity slowly   Complete by: As directed      Allergies as of 12/10/2020      Reactions   Ciprofloxacin Itching   Codeine Nausea Only   Naproxen Hives, Itching   Nitrofurantoin Monohyd Macro Rash   Penicillins Rash, Other (See Comments)   Has patient had [name] PCN reaction causing immediate rash, facial/tongue/throat swelling, SOB or lightheadedness with hypotension: No Has patient had [name] PCN reaction causing severe rash involving mucus membranes or skin necrosis: Yes Has patient had [name] PCN reaction that required hospitalization No Has patient had [name] PCN  reaction occurring within the last 10 years: No If all of the above answers are "NO", then may proceed with Cephalosporin use.      Medication List    TAKE these medications   CENTRUM ADULTS PO Take 1 tablet by mouth daily.   dexamethasone 6 MG tablet Commonly known as: DECADRON Take 1 tablet (6 mg total) by mouth daily for 5 days.            Durable Medical Equipment  (From admission, onward)         Start     Ordered   12/10/20 1448  For home use only DME oxygen  Once       Question Answer Comment  Length of Need 6 Months   Mode or (Route) Nasal cannula   Liters per Minute 2   Frequency Continuous (stationary and portable oxygen unit needed)   Oxygen conserving device Yes   Oxygen delivery system Gas  12/10/20 1447         Allergies  Allergen Reactions  . Ciprofloxacin Itching  . Codeine Nausea Only  . Naproxen Hives and Itching  . Nitrofurantoin Monohyd Macro Rash  . Penicillins Rash and Other (See Comments)    Has patient had Bosten Newstrom PCN reaction causing immediate rash, facial/tongue/throat swelling, SOB or lightheadedness with hypotension: No Has patient had Clemons Salvucci PCN reaction causing severe rash involving mucus membranes or skin necrosis: Yes Has patient had Julious Langlois PCN reaction that required hospitalization No Has patient had Deniah Saia PCN reaction occurring within the last 10 years: No If all of the above answers are "NO", then may proceed with Cephalosporin use.       The results of significant diagnostics from this hospitalization (including imaging, microbiology, ancillary and laboratory) are listed below for reference.    Significant Diagnostic Studies: DG CHEST PORT 1 VIEW  Result Date: 12/10/2020 CLINICAL DATA:  Hypoxia EXAM: PORTABLE CHEST 1 VIEW COMPARISON:  Two days ago FINDINGS: Cardiomegaly. Negative aortic and hilar contours. Indistinct density at the left base which is unchanged. No visible effusion or pneumothorax. IMPRESSION: Stable infiltrate at the  left base. Electronically Signed   By: Monte Fantasia M.D.   On: 12/10/2020 05:22   DG Chest Port 1 View  Result Date: 12/08/2020 CLINICAL DATA:  Shortness of breath, fever, productive cough, weakness and headache. EXAM: PORTABLE CHEST 1 VIEW COMPARISON:  07/13/2010 FINDINGS: Poor depth of inspiration. Mildly enlarged cardiac silhouette. Tortuous and partially calcified thoracic aorta. Minimal ill-defined increased density at both lung bases compatible with atelectasis. Small amount of patchy density in the left mid to lower lung zone laterally. Diffuse osteopenia. Left humeral head prosthesis. IMPRESSION: 1. Mild left mid to lower lung zone pneumonia laterally. 2. Poor inspiration with minimal bibasilar atelectasis. 3. Mild cardiomegaly. Electronically Signed   By: Claudie Revering M.D.   On: 12/08/2020 15:44    Microbiology: Recent Results (from the past 240 hour(s))  Culture, blood (routine x 2)     Status: None (Preliminary result)   Collection Time: 12/08/20  3:02 PM   Specimen: BLOOD RIGHT ARM  Result Value Ref Range Status   Specimen Description   Final    BLOOD RIGHT ARM Performed at [facility], [address] [place], [place] [address]    Special Requests   Final    BOTTLES DRAWN AEROBIC AND ANAEROBIC Blood Culture adequate volume Performed at [facility], [address] [place], [place] [address]    Culture   Final    NO GROWTH 1 DAY Performed at [facility], [place] [address] [place], [place] [address]    Report Status PENDING  Incomplete  SARS Coronavirus 2 by RT PCR (hospital order, performed in [facility] hospital lab) Nasopharyngeal Nasopharyngeal Swab     Status: Abnormal   Collection Time: 12/08/20  3:09 PM   Specimen: Nasopharyngeal Swab  Result Value Ref Range Status   SARS Coronavirus 2 POSITIVE (Alexas Basulto) NEGATIVE Final    Comment: RESULT CALLED TO, READ BACK BY AND VERIFIED WITH: [name] RN ON 12/08/20 AT 1619 [place] (NOTE) SARS-CoV-2  target nucleic acids are DETECTED  SARS-CoV-2 RNA is generally detectable in upper respiratory specimens  during the acute phase of infection.  Positive results are indicative  of the presence of the identified virus, but do not rule out bacterial infection or co-infection with other pathogens not detected by the test.  Clinical correlation with patient history  and  other diagnostic information is necessary to determine patient infection status.  The expected result is negative.  Fact Sheet for Patients:   StrictlyIdeas.no   Fact Sheet for Healthcare Providers:   BankingDealers.co.za    This test is not yet approved or cleared by the [place] FDA and  has been authorized for detection and/or diagnosis of SARS-CoV-2 by FDA under an Emergency Use Authorization (EUA).  This EUA will remain in effect (meaning this  test can be used) for the duration of  the COVID-19 declaration under Section 564(b)(1) of the Act, 21 U.S.C. section 360-bbb-3(b)(1), unless the authorization is terminated or revoked sooner.  Performed at [facility], [address] [place], [place] [address]   Culture, blood (routine x 2)     Status: None (Preliminary result)   Collection Time: 12/08/20  4:40 PM   Specimen: BLOOD  Result Value Ref Range Status   Specimen Description BLOOD LEFT ANTECUBITAL  Final   Special Requests   Final    BOTTLES DRAWN AEROBIC AND ANAEROBIC Blood Culture results may not be optimal due to an inadequate volume of blood received in culture bottles   Culture   Final    NO GROWTH 1 DAY Performed at [facility], [place] [address], [place], [place] [address]    Report Status PENDING  Incomplete  C Difficile Quick Screen w PCR reflex     Status: None   Collection Time: 12/09/20 12:38 PM   Specimen: STOOL  Result Value Ref Range Status   C Diff antigen NEGATIVE NEGATIVE Final   C Diff toxin NEGATIVE NEGATIVE Final   C  Diff interpretation No C. difficile detected.  Final    Comment: Performed at [facility], [place] [address], [place], [place] [address]     Labs: Basic Metabolic Panel: Recent Labs  Lab 12/08/20 1526 12/09/20 0332 12/10/20 0353  NA 136 139 142  K 3.2* 3.4* 4.3  CL 101 109 112*  CO2 23 20* 20*  GLUCOSE 129* 154* 137*  BUN 19 18 17   CREATININE 1.04* 0.76 0.73  CALCIUM 9.3 9.0 9.3  MG  --  2.0 2.0  PHOS  --  2.7 2.1*   Liver Function Tests: Recent Labs  Lab 12/08/20 1526 12/09/20 0332 12/10/20 0353  AST 121* 85* 54*  ALT 100* 87* 65*  ALKPHOS 52 50 47  BILITOT 1.5* 1.1 1.1  PROT 6.9 6.6 6.0*  ALBUMIN 3.8 3.5 3.2*   No results for input(s): LIPASE, AMYLASE in the last 168 hours. No results for input(s): AMMONIA in the last 168 hours. CBC: Recent Labs  Lab 12/08/20 1526 12/09/20 0332 12/10/20 0353  WBC 7.0 5.6 7.2  NEUTROABS 4.7 4.2 5.4  HGB 15.6* 15.5* 14.8  HCT 43.6 44.6 43.3  MCV 91.2 93.9 94.3  PLT 149* 155 158   Cardiac Enzymes: No results for input(s): CKTOTAL, CKMB, CKMBINDEX, TROPONINI in the last 168 hours. BNP: BNP (last 3 results) No results for input(s): BNP in the last 8760 hours.  ProBNP (last 3 results) No results for input(s): PROBNP in the last 8760 hours.  CBG: No results for input(s): GLUCAP in the last 168 hours.     Signed:  Fayrene Helper MD.  [facility] 12/10/2020, 3:00 PM

## 2020-12-10 NOTE — TOC Initial Note (Signed)
Transition of Care [facility]) - Initial/Assessment Note    Patient Details  Name: Linda [name] MRN: [id number] Date of Birth: 1946-10-12  Transition [facility]) CM/SW Contact:    Joaquin Courts, RN Phone Number: 12/10/2020, 10:12 AM  Clinical Narrative:                 CM spoke with patient re: recommendation for HHPT services.  Patient declines, reports that she is able to perform exercises on her own and always does this.  Reports she lives alone but her son comes in regularly and get her anything she needs, reports she is able to safely get around her home, get to the bathroom, and  Get herself food/drink.  CM instructed patient that should she change her mind, to notify bedside RN who will inform TOC.   Expected Discharge Plan: [place]     Patient Goals and CMS Choice Patient states their goals for this hospitalization and ongoing recovery are:: wnats to return home CMS Medicare.gov Compare Post Acute Care list provided to:: Patient Choice offered to / list presented to : Patient  Expected Discharge Plan and Services Expected Discharge Plan: [place]   Discharge Planning Services: CM Consult Post Acute Care Choice: [place] arrangements for the past 2 months: Single Family Home                           HH Arranged: Refused HH          Prior Living Arrangements/Services Living arrangements for the past 2 months: Single Family Home Lives with:: Self Patient language and need for interpreter reviewed:: Yes Do you feel safe going back to the place where you live?: Yes      Need for Family Participation in Patient Care: Yes (Comment) Care giver support system in place?: Yes (comment)   Criminal Activity/Legal Involvement Pertinent to Current Situation/Hospitalization: No - Comment as needed  Activities of Daily Living Home Assistive Devices/Equipment: None ADL Screening (condition at time of  admission) Patient's cognitive ability adequate to safely complete daily activities?: Yes Is the patient deaf or have difficulty hearing?: No Does the patient have difficulty seeing, even when wearing glasses/contacts?: No Does the patient have difficulty concentrating, remembering, or making decisions?: No Patient able to express need for assistance with ADLs?: Yes Does the patient have difficulty dressing or bathing?: Yes Independently performs ADLs?: Yes (appropriate for developmental age) Does the patient have difficulty walking or climbing stairs?: Yes Weakness of Legs: Both Weakness of Arms/Hands: None  Permission Sought/Granted                  Emotional Assessment Appearance:: Appears stated age Attitude/Demeanor/Rapport: Engaged Affect (typically observed): Accepting Orientation: : Oriented to Self,Oriented to Place,Oriented to  Time,Oriented to Situation   Psych Involvement: No (comment)  Admission diagnosis:  Hypokalemia [E87.6] Hypoxia [R09.02] COVID-19 [U07.1] Patient Active Problem List   Diagnosis Date Noted  . COVID-19 12/08/2020  . Hypokalemia 12/08/2020  . Fatty liver 01/25/2020  . Elevated liver function tests 01/25/2020  . Ureteral stone with hydronephrosis 01/22/2019  . Diverticular disease s/p robotic sigmoid colectomy 04/08/2017 04/08/2017  . Kidney stone 09/25/2016  . Diverticulosis of colon 09/25/2016  . Hydronephrosis of left kidney 09/25/2016  . Cholelithiasis 09/25/2016  . Atherosclerosis of abdominal aorta ([place]) 09/25/2016  . Renal colic on left side 42/35/3614   PCP:  Leonides Sake, MD Pharmacy:  [id number] [place], [place] [address] Phone: [phone] Fax: [phone]  [id number] [place], [place] [address] [place] [address] Phone: [phone] Fax:  [phone]     Social Determinants of Health (SDOH) Interventions    Readmission Risk Interventions No flowsheet data found.

## 2020-12-10 NOTE — TOC Progression Note (Signed)
Transition of Care [facility]) - Progression Note    Patient Details  Name: Linda [name] MRN: [id number] Date of Birth: [birth year]  [facility] CM/SW Contact  Joaquin Courts, RN Phone Number: 12/10/2020, 2:58 PM  Clinical Narrative:    [employer] rep given referral for home oxygen.   Expected Discharge Plan: [place]    Expected Discharge Plan and Services Expected Discharge Plan: [place]   Discharge Planning Services: CM Consult Post Acute Care Choice: [place] arrangements for the past 2 months: Single Family Home                 DME Arranged: Oxygen DME Agency: Other - Comment [employer]) Date DME Agency Contacted: 12/10/20 Time DME Agency Contacted: [phone] Representative spoke with at DME Agency: [facility] HH Arranged: Refused [place]           Social Determinants of Health ([place]) Interventions    Readmission Risk Interventions No flowsheet data found.

## 2020-12-14 LAB — CULTURE, BLOOD (ROUTINE X 2)
Culture: NO GROWTH
Culture: NO GROWTH
Special Requests: ADEQUATE

## 2021-02-06 ENCOUNTER — Telehealth: Payer: Self-pay

## 2021-02-06 DIAGNOSIS — R7989 Other specified abnormal findings of blood chemistry: Secondary | ICD-10-CM

## 2021-02-06 DIAGNOSIS — K760 Fatty (change of) liver, not elsewhere classified: Secondary | ICD-10-CM

## 2021-02-06 NOTE — Telephone Encounter (Signed)
-----   Message from Timothy Lasso, RN sent at 08/09/2020  3:48 PM EDT ----- Repeat CBC, LFTs, and PT/INR in 6 months as well as an OV follow-up.  Labs first.

## 2021-02-06 NOTE — Telephone Encounter (Signed)
The pt has been scheduled for follow up with Dr Silverio Decamp on 03/14/21 at 19 am.  Labs have been entered to be completed prior to the appt.   The patient has been notified of this information and all questions answered.

## 2021-03-07 ENCOUNTER — Other Ambulatory Visit (INDEPENDENT_AMBULATORY_CARE_PROVIDER_SITE_OTHER): Payer: Medicare Other

## 2021-03-07 DIAGNOSIS — K760 Fatty (change of) liver, not elsewhere classified: Secondary | ICD-10-CM

## 2021-03-07 DIAGNOSIS — R7989 Other specified abnormal findings of blood chemistry: Secondary | ICD-10-CM | POA: Diagnosis not present

## 2021-03-07 LAB — CBC WITH DIFFERENTIAL/PLATELET
Basophils Absolute: 0 10*3/uL (ref 0.0–0.1)
Basophils Relative: 0.5 % (ref 0.0–3.0)
Eosinophils Absolute: 0.1 10*3/uL (ref 0.0–0.7)
Eosinophils Relative: 1.9 % (ref 0.0–5.0)
HCT: 46.3 % — ABNORMAL HIGH (ref 36.0–46.0)
Hemoglobin: 15.9 g/dL — ABNORMAL HIGH (ref 12.0–15.0)
Lymphocytes Relative: 33 % (ref 12.0–46.0)
Lymphs Abs: 1.9 10*3/uL (ref 0.7–4.0)
MCHC: 34.3 g/dL (ref 30.0–36.0)
MCV: 94.8 fl (ref 78.0–100.0)
Monocytes Absolute: 0.4 10*3/uL (ref 0.1–1.0)
Monocytes Relative: 6.3 % (ref 3.0–12.0)
Neutro Abs: 3.3 10*3/uL (ref 1.4–7.7)
Neutrophils Relative %: 58.3 % (ref 43.0–77.0)
Platelets: 167 10*3/uL (ref 150.0–400.0)
RBC: 4.88 Mil/uL (ref 3.87–5.11)
RDW: 13 % (ref 11.5–15.5)
WBC: 5.7 10*3/uL (ref 4.0–10.5)

## 2021-03-07 LAB — HEPATIC FUNCTION PANEL
ALT: 90 U/L — ABNORMAL HIGH (ref 0–35)
AST: 75 U/L — ABNORMAL HIGH (ref 0–37)
Albumin: 4.2 g/dL (ref 3.5–5.2)
Alkaline Phosphatase: 69 U/L (ref 39–117)
Bilirubin, Direct: 0.2 mg/dL (ref 0.0–0.3)
Total Bilirubin: 1.1 mg/dL (ref 0.2–1.2)
Total Protein: 6.7 g/dL (ref 6.0–8.3)

## 2021-03-07 LAB — PROTIME-INR
INR: 1.2 ratio — ABNORMAL HIGH (ref 0.8–1.0)
Prothrombin Time: 13.1 s (ref 9.6–13.1)

## 2021-03-14 ENCOUNTER — Ambulatory Visit: Payer: Medicare Other | Admitting: Gastroenterology

## 2021-03-14 ENCOUNTER — Encounter: Payer: Self-pay | Admitting: Gastroenterology

## 2021-03-14 ENCOUNTER — Telehealth: Payer: Medicare Other

## 2021-03-14 DIAGNOSIS — R945 Abnormal results of liver function studies: Secondary | ICD-10-CM

## 2021-03-14 DIAGNOSIS — R7989 Other specified abnormal findings of blood chemistry: Secondary | ICD-10-CM

## 2021-03-14 LAB — IBC PANEL
Iron: 156 ug/dL — ABNORMAL HIGH (ref 42–145)
Saturation Ratios: 42.5 % (ref 20.0–50.0)
Transferrin: 262 mg/dL (ref 212.0–360.0)

## 2021-03-14 LAB — C-REACTIVE PROTEIN: CRP: <1 mg/dL (ref 0.5–20.0)

## 2021-03-14 LAB — FERRITIN: Ferritin: 485.2 ng/mL — ABNORMAL HIGH (ref 10.0–291.0)

## 2021-03-14 NOTE — Progress Notes (Signed)
Linda [name]    [id number]    [birth year]  Primary Care Physician:Hamrick, Lorin Mercy, MD  Referring Physician: Leonides Sake, MD [place],  [place] [address]   Chief complaint:  Fatty liver  HPI:  75 year old with Linda [name], abnormal LFT here for follow-up visit  Overall she is doing well. Denies any nausea, vomiting, abdominal pain, melena or bright red blood per rectum  Hepatic Function Latest Ref Rng & Units 03/07/2021 12/10/2020 12/09/2020  Total Protein 6.0 - 8.3 g/dL 6.7 6.0(L) 6.6  Albumin 3.5 - 5.2 g/dL 4.2 3.2(L) 3.5  AST 0 - 37 U/L 75(H) 54(H) 85(H)  ALT 0 - 35 U/L 90(H) 65(H) 87(H)  Alk Phosphatase 39 - 117 U/L 69 47 50  Total Bilirubin 0.2 - 1.2 mg/dL 1.1 1.1 1.1  Bilirubin, Direct 0.0 - 0.3 mg/dL 0.2 - -     Abdominal ultrasound with elastography June 29, 2019 showed 6 mm gallstone in the gallbladder neck with no gallbladder wall thickening or pericholecystic fluid.  Normal CBD 4 mm. Fibrosis score F2 plus and some F3 [moderate risk]  Bilateral renal calculi with no hydronephrosis    Hemoglobin A1c 5.2 Normal platelets and INR 1.1 Elevated cholesterol and triglycerides 216 Hepatitis C, hepatitis B surface antigen and core antibody, hepatitis A negative     Outpatient Encounter Medications as of 03/14/2021  Medication Sig  . Multiple Vitamins-Minerals (CENTRUM ADULTS PO) Take 1 tablet by mouth daily.   No facility-administered encounter medications on file as of 03/14/2021.    Allergies as of 03/14/2021 - Review Complete 03/14/2021  Allergen Reaction Noted  . Ciprofloxacin Itching 12/11/2015  . Codeine Nausea Only 12/11/2015  . Naproxen Hives and Itching 10/28/2011  . Nitrofurantoin monohyd macro Rash 10/28/2011  . Penicillins Rash and Other (See Comments) 10/28/2011    Past Medical History:  Diagnosis Date  . Cholelithiasis 09/25/2016  . Complication of anesthesia    was told by anesthesia in 1970's to  watch her heart during surgery following a knee surgery; but ha shad no problems with surgeries afterwards  . Diverticulitis   . Kidney stone   . Kidney stones   . Rosacea     Past Surgical History:  Procedure Laterality Date  . CESAREAN SECTION    . COLON SURGERY     for diverticulitis  . CYSTOSCOPY W/ URETERAL STENT PLACEMENT Left 01/23/2019   Procedure: CYSTOSCOPY WITH RETROGRADE PYELOGRAM/URETERAL STENT PLACEMENT,LASER uteroscopy;  Surgeon: Alexis Frock, MD;  Location: [facility];  Service: Urology;  Laterality: Left;  . HEMORRHOID SURGERY    . KNEE SURGERY Right    x3  . lithotripsy   09/2016  . PARTIAL HYSTERECTOMY    . TOTAL SHOULDER REPLACEMENT Left 07/13/2010    Family History  Problem Relation Age of Onset  . Nephrolithiasis Other   . Heart disease Father   . Colon cancer Neg Hx   . Colon polyps Neg Hx   . Esophageal cancer Neg Hx   . Stomach cancer Neg Hx   . Liver disease Neg Hx   . Pancreatic cancer Neg Hx     Social History   Socioeconomic History  . Marital status: Widowed    Spouse name: Not on file  . Number of children: Not on file  . Years of education: Not on file  . Highest education level: Not on file  Occupational History  . Not on file  Tobacco Use  .  Smoking status: Former Smoker    Years: 7.00  . Smokeless tobacco: Never Used  Vaping Use  . Vaping Use: Never used  Substance and Sexual Activity  . Alcohol use: No    Alcohol/week: 0.0 standard drinks  . Drug use: No  . Sexual activity: Never  Other Topics Concern  . Not on file  Social History Narrative  . Not on file   Social Determinants of Health   Financial Resource Strain: Not on file  Food Insecurity: Not on file  Transportation Needs: Not on file  Physical Activity: Not on file  Stress: Not on file  Social Connections: Not on file  Intimate Partner Violence: Not on file      Review of systems: All other review of systems negative except as mentioned in the  HPI.   Physical Exam: Vitals:   03/14/21 1033  BP: (!) 152/84  Pulse: 86   Body mass index is 29.45 kg/m. Gen:      No acute distress HEENT:  sclera anicteric Neuro: alert and oriented x 3 Psych: normal mood and affect  Data Reviewed:  Reviewed labs, radiology imaging, old records and pertinent past GI work up   Assessment and Plan/Recommendations:  75 year old very pleasant female with history of sigmoid diverticulitis and NASH here for follow-up for abnormal LFT Transaminases remain elevated Avoid NSAIDs, alcohol and hepatotoxins Follow-up LFT, ferritin and CRP We will obtain abdominal ultrasound right upper quadrant  Continue with low-carb low-fat diet and exercise  Return in 3 months or sooner if needed   The patient was provided an opportunity to ask questions and all were answered. The patient agreed with the plan and demonstrated an understanding of the instructions.  Linda [name] , MD    CC: Hamrick, Lorin Mercy, MD

## 2021-03-14 NOTE — Patient Instructions (Addendum)
If you are age 75 or older, your body mass index should be between 23-30. Your Body mass index is 29.45 kg/m. If this is out of the aforementioned range listed, please consider follow up with your Primary Care Provider.   Your provider has requested that you go to the basement level for lab work before leaving today. Press "B" on the elevator. The lab is located at the first door on the left as you exit the elevator.  Due to recent changes in healthcare laws, you may see the results of your imaging and laboratory studies on MyChart before your provider has had a chance to review them.  We understand that in some cases there may be results that are confusing or concerning to you. Not all laboratory results come back in the same time frame and the provider may be waiting for multiple results in order to interpret others.  Please give [place] 48 hours in order for your provider to thoroughly review all the results before contacting the office for clarification of your results.   You have been scheduled for an abdominal ultrasound at [facility] Radiology (1st floor of hospital) on 03-21-2021 at 9:30am. Please arrive 15 minutes prior to your appointment for registration. Make certain not to have anything to eat or drink 6 hours prior to your- appointment. Should you need to reschedule your appointment, please contact radiology at [phone]. This test typically takes about 30 minutes to perform.  You will be seen in follow up in 3 months (August 2022).  Please contact our office to schedule.   Thank you for entrusting me with your care and choosing [facility].  Dr Silverio Decamp

## 2021-03-21 ENCOUNTER — Ambulatory Visit (HOSPITAL_COMMUNITY)
Admission: RE | Admit: 2021-03-21 | Discharge: 2021-03-21 | Disposition: A | Discharge status: Home / Self Care | Payer: Medicare Other | Source: Ambulatory Visit | Attending: Gastroenterology | Admitting: Gastroenterology

## 2021-03-21 ENCOUNTER — Other Ambulatory Visit: Payer: Self-pay

## 2021-03-21 DIAGNOSIS — R945 Abnormal results of liver function studies: Secondary | ICD-10-CM | POA: Insufficient documentation

## 2021-03-21 DIAGNOSIS — R7989 Other specified abnormal findings of blood chemistry: Secondary | ICD-10-CM

## 2021-03-25 ENCOUNTER — Encounter: Payer: Self-pay | Admitting: Gastroenterology

## 2021-06-27 ENCOUNTER — Other Ambulatory Visit: Payer: Self-pay

## 2021-06-27 ENCOUNTER — Other Ambulatory Visit (INDEPENDENT_AMBULATORY_CARE_PROVIDER_SITE_OTHER): Payer: Medicare Other

## 2021-06-27 ENCOUNTER — Ambulatory Visit: Payer: Medicare Other | Admitting: Gastroenterology

## 2021-06-27 ENCOUNTER — Encounter: Payer: Self-pay | Admitting: Gastroenterology

## 2021-06-27 VITALS — BP 144/78 | HR 85 | Ht 61.5 in | Wt 162.0 lb

## 2021-06-27 DIAGNOSIS — R945 Abnormal results of liver function studies: Secondary | ICD-10-CM | POA: Diagnosis not present

## 2021-06-27 DIAGNOSIS — R7989 Other specified abnormal findings of blood chemistry: Secondary | ICD-10-CM

## 2021-06-27 DIAGNOSIS — K7581 Nonalcoholic steatohepatitis (NASH): Secondary | ICD-10-CM | POA: Diagnosis not present

## 2021-06-27 LAB — COMPREHENSIVE METABOLIC PANEL
ALT: 43 U/L — ABNORMAL HIGH (ref 0–35)
AST: 34 U/L (ref 0–37)
Albumin: 4.4 g/dL (ref 3.5–5.2)
Alkaline Phosphatase: 76 U/L (ref 39–117)
BUN: 12 mg/dL (ref 6–23)
CO2: 28 mEq/L (ref 19–32)
Calcium: 10.5 mg/dL (ref 8.4–10.5)
Chloride: 103 mEq/L (ref 96–112)
Creatinine, Ser: 0.68 mg/dL (ref 0.40–1.20)
GFR: 85.35 mL/min (ref >60.00)
Glucose, Bld: 86 mg/dL (ref 70–99)
Potassium: 4.4 mEq/L (ref 3.5–5.1)
Sodium: 141 mEq/L (ref 135–145)
Total Bilirubin: 0.9 mg/dL (ref 0.2–1.2)
Total Protein: 7.1 g/dL (ref 6.0–8.3)

## 2021-06-27 LAB — CBC WITH DIFFERENTIAL/PLATELET
Basophils Absolute: 0 10*3/uL (ref 0.0–0.1)
Basophils Relative: 0.5 % (ref 0.0–3.0)
Eosinophils Absolute: 0.1 10*3/uL (ref 0.0–0.7)
Eosinophils Relative: 1.9 % (ref 0.0–5.0)
HCT: 46.4 % — ABNORMAL HIGH (ref 36.0–46.0)
Hemoglobin: 16 g/dL — ABNORMAL HIGH (ref 12.0–15.0)
Lymphocytes Relative: 28.7 % (ref 12.0–46.0)
Lymphs Abs: 2.1 10*3/uL (ref 0.7–4.0)
MCHC: 34.6 g/dL (ref 30.0–36.0)
MCV: 95 fl (ref 78.0–100.0)
Monocytes Absolute: 0.5 10*3/uL (ref 0.1–1.0)
Monocytes Relative: 6.3 % (ref 3.0–12.0)
Neutro Abs: 4.7 10*3/uL (ref 1.4–7.7)
Neutrophils Relative %: 62.6 % (ref 43.0–77.0)
Platelets: 182 10*3/uL (ref 150.0–400.0)
RBC: 4.88 Mil/uL (ref 3.87–5.11)
RDW: 12.7 % (ref 11.5–15.5)
WBC: 7.5 10*3/uL (ref 4.0–10.5)

## 2021-06-27 LAB — IBC + FERRITIN
Ferritin: 242.8 ng/mL (ref 10.0–291.0)
Iron: 145 ug/dL (ref 42–145)
Saturation Ratios: 39.5 % (ref 20.0–50.0)
TIBC: 366.8 ug/dL (ref 250.0–450.0)
Transferrin: 262 mg/dL (ref 212.0–360.0)

## 2021-06-27 NOTE — Progress Notes (Signed)
Linda [name]    [id number]    30-Dec-1945  Primary Care Physician:Hamrick, Lorin Mercy, MD  Referring Physician: Leonides Sake, [place],  [place] [address]   Chief complaint:  Abnormal LFT  HPI:  75 year old very pleasant female with Linda [name], abnormal LFT here for follow-up visit. Stools trying to modify her diet, is avoiding fried foods, is eating red meat only once or twice a week.   She drinks 2-3 Dr. Samson Frederic per day, also drinks orange juice and cranberry juice.  She has a sweet tooth and likes to eat candy, cookies and pies. She has not been able to walk much due to the heat but has been doing yard work and morning, she stays active most of the time.   Overall she is doing well. Denies any nausea, vomiting, abdominal pain, melena or bright red blood per rectum   Hepatic Function Latest Ref Rng & Units 03/07/2021 12/10/2020 12/09/2020  Total Protein 6.0 - 8.3 g/dL 6.7 6.0(L) 6.6  Albumin 3.5 - 5.2 g/dL 4.2 3.2(L) 3.5  AST 0 - 37 U/L 75(H) 54(H) 85(H)  ALT 0 - 35 U/L 90(H) 65(H) 87(H)  Alk Phosphatase 39 - 117 U/L 69 47 50  Total Bilirubin 0.2 - 1.2 mg/dL 1.1 1.1 1.1  Bilirubin, Direct 0.0 - 0.3 mg/dL 0.2 - -        Abdominal ultrasound with elastography June 29, 2019 showed 6 mm gallstone in the gallbladder neck with no gallbladder wall thickening or pericholecystic fluid.  Normal CBD 4 mm. Fibrosis score F2 plus and some F3 [moderate risk]   Bilateral renal calculi with no hydronephrosis   Colonoscopy February 15, 2016: Sigmoid diverticulosis and internal hemorrhoids otherwise negative for polyps.   Hemoglobin A1c 5.2 Normal platelets and INR 1.1 Elevated cholesterol and triglycerides 216 Hepatitis C, hepatitis B surface antigen and core antibody, hepatitis A negative    Outpatient Encounter Medications as of 06/27/2021  Medication Sig   Multiple Vitamins-Minerals (CENTRUM ADULTS PO) Take 1 tablet by mouth daily.   No  facility-administered encounter medications on file as of 06/27/2021.    Allergies as of 06/27/2021 - Review Complete 06/27/2021  Allergen Reaction Noted   Ciprofloxacin Itching 12/11/2015   Codeine Nausea Only 12/11/2015   Naproxen Hives and Itching 10/28/2011   Nitrofurantoin monohyd macro Rash 10/28/2011   Penicillins Rash and Other (See Comments) 10/28/2011    Past Medical History:  Diagnosis Date   Cholelithiasis 27/78/2423   Complication of anesthesia    was told by anesthesia in 1970's to watch her heart during surgery following a knee surgery; but ha shad no problems with surgeries afterwards   Diverticulitis    Kidney stone    Kidney stones    Rosacea     Past Surgical History:  Procedure Laterality Date   CESAREAN SECTION     COLON SURGERY     for diverticulitis   CYSTOSCOPY W/ URETERAL STENT PLACEMENT Left 01/23/2019   Procedure: CYSTOSCOPY WITH RETROGRADE PYELOGRAM/URETERAL STENT PLACEMENT,LASER uteroscopy;  Surgeon: Alexis Frock, MD;  Location: [facility];  Service: Urology;  Laterality: Left;   HEMORRHOID SURGERY     KNEE SURGERY Right    x3   lithotripsy   09/2016   PARTIAL HYSTERECTOMY     TOTAL SHOULDER REPLACEMENT Left 07/13/2010    Family History  Problem Relation Age of Onset   Nephrolithiasis Other    Heart disease Father  Colon cancer Neg Hx    Colon polyps Neg Hx    Esophageal cancer Neg Hx    Stomach cancer Neg Hx    Liver disease Neg Hx    Pancreatic cancer Neg Hx     Social History   Socioeconomic History   Marital status: Widowed    Spouse name: Not on file   Number of children: Not on file   Years of education: Not on file   Highest education level: Not on file  Occupational History   Not on file  Tobacco Use   Smoking status: Former    Years: 7.00    Types: Cigarettes   Smokeless tobacco: Never  Vaping Use   Vaping Use: Never used  Substance and Sexual Activity   Alcohol use: No    Alcohol/week: 0.0 standard drinks    Drug use: No   Sexual activity: Never  Other Topics Concern   Not on file  Social History Narrative   Not on file   Social Determinants of Health   Financial Resource Strain: Not on file  Food Insecurity: Not on file  Transportation Needs: Not on file  Physical Activity: Not on file  Stress: Not on file  Social Connections: Not on file  Intimate Partner Violence: Not on file      Review of systems: All other review of systems negative except as mentioned in the HPI.   Physical Exam: Vitals:   06/27/21 0808  BP: (!) 144/78  Pulse: 85   Body mass index is 30.11 kg/m. Gen:      No acute distress, looks younger than stated age 31:  sclera anicteric Abd:      soft, non-tender; no palpable masses, no distension Ext:    No edema Neuro: alert and oriented x 3 Psych: normal mood and affect  Data Reviewed:  Reviewed labs, radiology imaging, old records and pertinent past GI work up   Assessment and Plan/Recommendations: 75 year old very pleasant female with history of sigmoid diverticulitis and NASH here for follow-up for abnormal LFT Transaminases remain elevated Avoid NSAIDs, alcohol and hepatotoxins Advised patient to avoid soda and fruit juice.  She will also need to limit sugar intake Continue with low-carb low-fat diet and exercise  Follow-up CBC, CMP, will also obtain iron panel, had elevated ferritin We will obtain abdominal ultrasound right upper quadrant with elastography to assess degree of fibrosis   Colorectal cancer screening: Average, no polyps.  No recall due to age.  Return in 3 months or sooner if needed  The patient was provided an opportunity to ask questions and all were answered. The patient agreed with the plan and demonstrated an understanding of the instructions.  [name] , MD    CC: Hamrick, Lorin Mercy, MD

## 2021-06-27 NOTE — Patient Instructions (Signed)
You have been scheduled for an abdominal ultrasound/ Elastography at [facility] (1st floor of hospital) on Wed Aug 24th at 10am. Please arrive 15 minutes prior to your appointment for registration. Make certain not to have anything to eat or drink after midnight prior to your appointment. Should you need to reschedule your appointment, please contact radiology at [phone]. This test typically takes about 30 minutes to perform.   Your provider has requested that you go to the basement level for lab work before leaving today. Press "B" on the elevator. The lab is located at the first door on the left as you exit the elevator.   Decrease sugar intake  AVOID soda and fruit juice   Due to recent changes in healthcare laws, you may see the results of your imaging and laboratory studies on MyChart before your provider has had a chance to review them.  We understand that in some cases there may be results that are confusing or concerning to you. Not all laboratory results come back in the same time frame and the provider may be waiting for multiple results in order to interpret others.  Please give [place] 48 hours in order for your provider to thoroughly review all the results before contacting the office for clarification of your results.    If you are age 27 or older, your body mass index should be between 23-30. Your Body mass index is 30.11 kg/m. If this is out of the aforementioned range listed, please consider follow up with your Primary Care Provider.  If you are age 64 or younger, your body mass index should be between 19-25. Your Body mass index is 30.11 kg/m. If this is out of the aformentioned range listed, please consider follow up with your Primary Care Provider.   __________________________________________________________  The [place] [facility] providers would like to encourage you to use [facility] to communicate with providers for non-urgent requests or questions.  Due to long hold times  on the telephone, sending your provider a message by [facility] may be a faster and more efficient way to get a response.  Please allow 48 business hours for a response.  Please remember that this is for non-urgent requests.    Thank you for choosing [facility]  Karleen Hampshire [place],MD

## 2021-06-27 NOTE — Addendum Note (Signed)
Addended by: Oda Kilts on: 06/27/2021 08:52 AM   Modules accepted: Orders

## 2021-07-03 ENCOUNTER — Other Ambulatory Visit: Payer: Self-pay

## 2021-07-03 ENCOUNTER — Ambulatory Visit (HOSPITAL_COMMUNITY)
Admission: RE | Admit: 2021-07-03 | Discharge: 2021-07-03 | Disposition: A | Discharge status: Home / Self Care | Payer: Medicare Other | Source: Ambulatory Visit | Attending: Gastroenterology | Admitting: Gastroenterology

## 2021-07-03 DIAGNOSIS — R945 Abnormal results of liver function studies: Secondary | ICD-10-CM | POA: Insufficient documentation

## 2021-07-03 DIAGNOSIS — K7581 Nonalcoholic steatohepatitis (NASH): Secondary | ICD-10-CM | POA: Diagnosis present

## 2021-07-03 DIAGNOSIS — R7989 Other specified abnormal findings of blood chemistry: Secondary | ICD-10-CM

## 2021-09-30 ENCOUNTER — Other Ambulatory Visit (INDEPENDENT_AMBULATORY_CARE_PROVIDER_SITE_OTHER): Payer: Medicare Other

## 2021-09-30 DIAGNOSIS — R7989 Other specified abnormal findings of blood chemistry: Secondary | ICD-10-CM | POA: Diagnosis not present

## 2021-09-30 LAB — IBC + FERRITIN
Ferritin: 227.4 ng/mL (ref 10.0–291.0)
Iron: 176 ug/dL — ABNORMAL HIGH (ref 42–145)
Saturation Ratios: 48.4 % (ref 20.0–50.0)
TIBC: 364 ug/dL (ref 250.0–450.0)
Transferrin: 260 mg/dL (ref 212.0–360.0)

## 2022-02-03 DIAGNOSIS — Z1231 Encounter for screening mammogram for malignant neoplasm of breast: Secondary | ICD-10-CM | POA: Diagnosis not present

## 2022-06-12 DIAGNOSIS — Z23 Encounter for immunization: Secondary | ICD-10-CM | POA: Diagnosis not present

## 2022-06-12 DIAGNOSIS — S81811A Laceration without foreign body, right lower leg, initial encounter: Secondary | ICD-10-CM | POA: Diagnosis not present

## 2022-07-17 DIAGNOSIS — H53143 Visual discomfort, bilateral: Secondary | ICD-10-CM | POA: Diagnosis not present

## 2022-08-26 ENCOUNTER — Encounter (HOSPITAL_COMMUNITY)
Admission: EM | Disposition: A | Discharge status: Home / Self Care | Payer: Self-pay | Source: Home / Self Care | Attending: Emergency Medicine

## 2022-08-26 ENCOUNTER — Emergency Department (HOSPITAL_BASED_OUTPATIENT_CLINIC_OR_DEPARTMENT_OTHER): Payer: Medicare Other | Admitting: Anesthesiology

## 2022-08-26 ENCOUNTER — Emergency Department (HOSPITAL_COMMUNITY): Payer: Medicare Other

## 2022-08-26 ENCOUNTER — Ambulatory Visit (HOSPITAL_COMMUNITY)
Admission: EM | Admit: 2022-08-26 | Discharge: 2022-08-26 | Disposition: A | Discharge status: Home / Self Care | Payer: Medicare Other | Attending: Emergency Medicine | Admitting: Emergency Medicine

## 2022-08-26 ENCOUNTER — Emergency Department (HOSPITAL_COMMUNITY): Payer: Medicare Other | Admitting: Anesthesiology

## 2022-08-26 ENCOUNTER — Encounter (HOSPITAL_COMMUNITY): Payer: Self-pay

## 2022-08-26 DIAGNOSIS — Z87891 Personal history of nicotine dependence: Secondary | ICD-10-CM | POA: Diagnosis not present

## 2022-08-26 DIAGNOSIS — K8020 Calculus of gallbladder without cholecystitis without obstruction: Secondary | ICD-10-CM | POA: Diagnosis not present

## 2022-08-26 DIAGNOSIS — R10A2 Flank pain, left side: Secondary | ICD-10-CM

## 2022-08-26 DIAGNOSIS — N201 Calculus of ureter: Secondary | ICD-10-CM | POA: Diagnosis not present

## 2022-08-26 DIAGNOSIS — K449 Diaphragmatic hernia without obstruction or gangrene: Secondary | ICD-10-CM | POA: Diagnosis not present

## 2022-08-26 DIAGNOSIS — Z9049 Acquired absence of other specified parts of digestive tract: Secondary | ICD-10-CM | POA: Diagnosis not present

## 2022-08-26 DIAGNOSIS — R109 Unspecified abdominal pain: Secondary | ICD-10-CM | POA: Diagnosis present

## 2022-08-26 DIAGNOSIS — N1330 Unspecified hydronephrosis: Secondary | ICD-10-CM | POA: Diagnosis not present

## 2022-08-26 DIAGNOSIS — R1032 Left lower quadrant pain: Secondary | ICD-10-CM | POA: Diagnosis not present

## 2022-08-26 DIAGNOSIS — K429 Umbilical hernia without obstruction or gangrene: Secondary | ICD-10-CM | POA: Diagnosis not present

## 2022-08-26 DIAGNOSIS — N23 Unspecified renal colic: Secondary | ICD-10-CM | POA: Diagnosis not present

## 2022-08-26 DIAGNOSIS — Z87442 Personal history of urinary calculi: Secondary | ICD-10-CM | POA: Diagnosis not present

## 2022-08-26 DIAGNOSIS — N132 Hydronephrosis with renal and ureteral calculous obstruction: Secondary | ICD-10-CM | POA: Diagnosis not present

## 2022-08-26 HISTORY — PX: CYSTOSCOPY/URETEROSCOPY/HOLMIUM LASER/STENT PLACEMENT: SHX6546

## 2022-08-26 LAB — CBC WITH DIFFERENTIAL/PLATELET
Abs Immature Granulocytes: 0.01 10*3/uL (ref 0.00–0.07)
Basophils Absolute: 0 10*3/uL (ref 0.0–0.1)
Basophils Relative: 0 %
Eosinophils Absolute: 0.2 10*3/uL (ref 0.0–0.5)
Eosinophils Relative: 2 %
HCT: 45.8 % (ref 36.0–46.0)
Hemoglobin: 15.9 g/dL — ABNORMAL HIGH (ref 12.0–15.0)
Immature Granulocytes: 0 %
Lymphocytes Relative: 26 %
Lymphs Abs: 2.3 10*3/uL (ref 0.7–4.0)
MCH: 32.8 pg (ref 26.0–34.0)
MCHC: 34.7 g/dL (ref 30.0–36.0)
MCV: 94.4 fL (ref 80.0–100.0)
Monocytes Absolute: 0.6 10*3/uL (ref 0.1–1.0)
Monocytes Relative: 7 %
Neutro Abs: 5.9 10*3/uL (ref 1.7–7.7)
Neutrophils Relative %: 65 %
Platelets: 192 10*3/uL (ref 150–400)
RBC: 4.85 MIL/uL (ref 3.87–5.11)
RDW: 13.2 % (ref 11.5–15.5)
WBC: 9 10*3/uL (ref 4.0–10.5)
nRBC: 0 % (ref 0.0–0.2)

## 2022-08-26 LAB — COMPREHENSIVE METABOLIC PANEL
ALT: 50 U/L — ABNORMAL HIGH (ref 0–44)
AST: 46 U/L — ABNORMAL HIGH (ref 15–41)
Albumin: 4.3 g/dL (ref 3.5–5.0)
Alkaline Phosphatase: 74 U/L (ref 38–126)
Anion gap: 9 (ref 5–15)
BUN: 18 mg/dL (ref 8–23)
CO2: 21 mmol/L — ABNORMAL LOW (ref 22–32)
Calcium: 9.9 mg/dL (ref 8.9–10.3)
Chloride: 107 mmol/L (ref 98–111)
Creatinine, Ser: 0.73 mg/dL (ref 0.44–1.00)
GFR, Estimated: >60 mL/min (ref >60)
Glucose, Bld: 118 mg/dL — ABNORMAL HIGH (ref 70–99)
Potassium: 4.2 mmol/L (ref 3.5–5.1)
Sodium: 137 mmol/L (ref 135–145)
Total Bilirubin: 1.2 mg/dL (ref 0.3–1.2)
Total Protein: 7.1 g/dL (ref 6.5–8.1)

## 2022-08-26 LAB — URINALYSIS, ROUTINE W REFLEX MICROSCOPIC
Bilirubin Urine: NEGATIVE
Glucose, UA: NEGATIVE mg/dL
Ketones, ur: NEGATIVE mg/dL
Nitrite: NEGATIVE
Protein, ur: NEGATIVE mg/dL
RBC / HPF: >50 RBC/hpf — ABNORMAL HIGH (ref 0–5)
RBC / HPF: >50 RBC/hpf — ABNORMAL HIGH (ref 0–5)
Specific Gravity, Urine: 1.017 (ref 1.005–1.030)
pH: 5 (ref 5.0–8.0)

## 2022-08-26 SURGERY — CYSTOSCOPY/URETEROSCOPY/HOLMIUM LASER/STENT PLACEMENT
Anesthesia: General | Site: Urethra | Laterality: Left

## 2022-08-26 MED ORDER — HYDROMORPHONE HCL 1 MG/ML IJ SOLN
1.0000 mg | Freq: Once | INTRAMUSCULAR | Status: AC
  Administered 2022-08-26: 1 mg via INTRAVENOUS
  Filled 2022-08-26: qty 1

## 2022-08-26 MED ORDER — ORAL CARE MOUTH RINSE: 15.0000 mL | Freq: Once | OROMUCOSAL | Status: AC

## 2022-08-26 MED ORDER — SENNOSIDES-DOCUSATE SODIUM 8.6-50 MG PO TABS: 1.0000 | ORAL_TABLET | Freq: Two times a day (BID) | ORAL | 0 refills | Status: AC

## 2022-08-26 MED ORDER — ACETAMINOPHEN 500 MG PO TABS: 1000.0000 mg | ORAL_TABLET | Freq: Once | ORAL | Status: DC

## 2022-08-26 MED ORDER — DEXAMETHASONE SODIUM PHOSPHATE 10 MG/ML IJ SOLN
INTRAMUSCULAR | Status: DC | PRN
  Administered 2022-08-26: 10 mg via INTRAVENOUS

## 2022-08-26 MED ORDER — CHLORHEXIDINE GLUCONATE 0.12 % MT SOLN
15.0000 mL | Freq: Once | OROMUCOSAL | Status: AC
  Administered 2022-08-26: 15 mL via OROMUCOSAL

## 2022-08-26 MED ORDER — ONDANSETRON HCL 4 MG/2ML IJ SOLN
4.0000 mg | Freq: Once | INTRAMUSCULAR | Status: AC
  Administered 2022-08-26: 4 mg via INTRAVENOUS
  Filled 2022-08-26: qty 2

## 2022-08-26 MED ORDER — SUCCINYLCHOLINE CHLORIDE 200 MG/10ML IV SOSY
PREFILLED_SYRINGE | INTRAVENOUS | Status: DC | PRN
  Administered 2022-08-26: 100 mg via INTRAVENOUS

## 2022-08-26 MED ORDER — PROPOFOL 10 MG/ML IV BOLUS
INTRAVENOUS | Status: AC
  Filled 2022-08-26: qty 20

## 2022-08-26 MED ORDER — OXYCODONE HCL 5 MG PO TABS: 5.0000 mg | ORAL_TABLET | Freq: Once | ORAL | Status: DC | PRN

## 2022-08-26 MED ORDER — ONDANSETRON 4 MG PO TBDP
4.0000 mg | ORAL_TABLET | Freq: Once | ORAL | Status: AC
  Administered 2022-08-26: 4 mg via ORAL
  Filled 2022-08-26: qty 1

## 2022-08-26 MED ORDER — DROPERIDOL 2.5 MG/ML IJ SOLN
0.6250 mg | Freq: Once | INTRAMUSCULAR | Status: AC
  Administered 2022-08-26: 0.625 mg via INTRAVENOUS

## 2022-08-26 MED ORDER — LIDOCAINE HCL (PF) 2 % IJ SOLN
INTRAMUSCULAR | Status: AC
  Filled 2022-08-26: qty 5

## 2022-08-26 MED ORDER — ROCURONIUM BROMIDE 10 MG/ML (PF) SYRINGE
PREFILLED_SYRINGE | INTRAVENOUS | Status: DC | PRN
  Administered 2022-08-26: 40 mg via INTRAVENOUS

## 2022-08-26 MED ORDER — LACTATED RINGERS IV SOLN: INTRAVENOUS | Status: DC

## 2022-08-26 MED ORDER — PROPOFOL 10 MG/ML IV BOLUS
INTRAVENOUS | Status: DC | PRN
  Administered 2022-08-26: 120 mg via INTRAVENOUS

## 2022-08-26 MED ORDER — LACTATED RINGERS IV BOLUS
1000.0000 mL | Freq: Once | INTRAVENOUS | Status: AC
  Administered 2022-08-26: 1000 mL via INTRAVENOUS

## 2022-08-26 MED ORDER — SODIUM CHLORIDE 0.9 % IR SOLN
Status: DC | PRN
  Administered 2022-08-26: 3000 mL

## 2022-08-26 MED ORDER — OXYCODONE HCL 5 MG/5ML PO SOLN: 5.0000 mg | Freq: Once | ORAL | Status: DC | PRN

## 2022-08-26 MED ORDER — SUGAMMADEX SODIUM 200 MG/2ML IV SOLN
INTRAVENOUS | Status: DC | PRN
  Administered 2022-08-26: 200 mg via INTRAVENOUS

## 2022-08-26 MED ORDER — ONDANSETRON HCL 4 MG/2ML IJ SOLN
INTRAMUSCULAR | Status: DC | PRN
  Administered 2022-08-26: 4 mg via INTRAVENOUS

## 2022-08-26 MED ORDER — SODIUM CHLORIDE 0.9 % IV SOLN
6.2500 mg | Freq: Once | INTRAVENOUS | Status: DC
  Filled 2022-08-26 (×3): qty 0.25

## 2022-08-26 MED ORDER — OXYCODONE-ACETAMINOPHEN 5-325 MG PO TABS
1.0000 | ORAL_TABLET | Freq: Once | ORAL | Status: AC
Start: 2022-08-26 — End: 2022-08-26
  Administered 2022-08-26: 1 via ORAL
  Filled 2022-08-26: qty 1

## 2022-08-26 MED ORDER — FENTANYL CITRATE (PF) 100 MCG/2ML IJ SOLN
INTRAMUSCULAR | Status: DC | PRN
  Administered 2022-08-26: 100 ug via INTRAVENOUS
  Administered 2022-08-26: 50 ug via INTRAVENOUS

## 2022-08-26 MED ORDER — FENTANYL CITRATE PF 50 MCG/ML IJ SOSY: 25.0000 ug | PREFILLED_SYRINGE | INTRAMUSCULAR | Status: DC | PRN

## 2022-08-26 MED ORDER — SULFAMETHOXAZOLE-TRIMETHOPRIM 800-160 MG PO TABS: 1.0000 | ORAL_TABLET | Freq: Two times a day (BID) | ORAL | 0 refills | Status: AC

## 2022-08-26 MED ORDER — FENTANYL CITRATE (PF) 100 MCG/2ML IJ SOLN
INTRAMUSCULAR | Status: AC
  Filled 2022-08-26: qty 2

## 2022-08-26 MED ORDER — 0.9 % SODIUM CHLORIDE (POUR BTL) OPTIME
TOPICAL | Status: DC | PRN
  Administered 2022-08-26: 1000 mL

## 2022-08-26 MED ORDER — LIDOCAINE 2% (20 MG/ML) 5 ML SYRINGE
INTRAMUSCULAR | Status: DC | PRN
  Administered 2022-08-26: 100 mg via INTRAVENOUS

## 2022-08-26 MED ORDER — DROPERIDOL 2.5 MG/ML IJ SOLN
INTRAMUSCULAR | Status: AC
  Filled 2022-08-26: qty 2

## 2022-08-26 MED ORDER — KETOROLAC TROMETHAMINE 15 MG/ML IJ SOLN
15.0000 mg | Freq: Once | INTRAMUSCULAR | Status: AC
  Administered 2022-08-26: 15 mg via INTRAVENOUS
  Filled 2022-08-26: qty 1

## 2022-08-26 MED ORDER — IOHEXOL 300 MG/ML  SOLN
INTRAMUSCULAR | Status: DC | PRN
  Administered 2022-08-26: 10 mL

## 2022-08-26 MED ORDER — GENTAMICIN SULFATE 40 MG/ML IJ SOLN
5.0000 mg/kg | INTRAVENOUS | Status: AC
  Administered 2022-08-26: 300 mg via INTRAVENOUS
  Filled 2022-08-26 (×2): qty 7.5

## 2022-08-26 MED ORDER — OXYCODONE HCL 5 MG PO TABS: 5.0000 mg | ORAL_TABLET | Freq: Four times a day (QID) | ORAL | 0 refills | Status: AC | PRN

## 2022-08-26 SURGICAL SUPPLY — 23 items
BAG URO CATCHER STRL LF (MISCELLANEOUS) ×1 IMPLANT
BASKET LASER NITINOL 1.9FR (BASKET) IMPLANT
BSKT STON RTRVL 120 1.9FR (BASKET) ×1
CATH URETL OPEN END 6FR 70 (CATHETERS) ×2 IMPLANT
CLOTH BEACON ORANGE TIMEOUT ST (SAFETY) ×1 IMPLANT
EXTRACTOR STONE 1.7FRX115CM (UROLOGICAL SUPPLIES) IMPLANT
GLOVE SURG LX STRL 7.5 STRW (GLOVE) ×2 IMPLANT
GOWN STRL REUS W/ TWL XL LVL3 (GOWN DISPOSABLE) ×1 IMPLANT
GOWN STRL REUS W/TWL XL LVL3 (GOWN DISPOSABLE) ×1
GUIDEWIRE ANG ZIPWIRE 038X150 (WIRE) ×2 IMPLANT
GUIDEWIRE STR DUAL SENSOR (WIRE) ×1 IMPLANT
KIT TURNOVER KIT A (KITS) IMPLANT
LASER FIB FLEXIVA PULSE ID 365 (Laser) IMPLANT
MANIFOLD NEPTUNE II (INSTRUMENTS) ×1 IMPLANT
PACK CYSTO (CUSTOM PROCEDURE TRAY) ×1 IMPLANT
SHEATH NAVIGATOR HD 11/13X28 (SHEATH) IMPLANT
SHEATH NAVIGATOR HD 11/13X36 (SHEATH) IMPLANT
STENT POLARIS 5FRX22 (STENTS) IMPLANT
TRACTIP FLEXIVA PULS ID 200XHI (Laser) IMPLANT
TRACTIP FLEXIVA PULSE ID 200 (Laser) ×1
TUBE FEEDING 8FR 16IN STR KANG (MISCELLANEOUS) ×1 IMPLANT
TUBING CONNECTING 10 (TUBING) ×1 IMPLANT
TUBING UROLOGY SET (TUBING) ×1 IMPLANT

## 2022-08-26 NOTE — Anesthesia Procedure Notes (Signed)
Procedure Name: Intubation Date/Time: 08/26/2022 4:22 PM  Performed by: Myna Bright, CRNAPre-anesthesia Checklist: Patient identified, Emergency Drugs available and Patient being monitored Oxygen Delivery Method: Circle system utilized Preoxygenation: Pre-oxygenation with 100% oxygen Induction Type: IV induction Ventilation: Mask ventilation without difficulty Laryngoscope Size: 3 and Mac Grade View: Grade I Tube type: Oral Tube size: 7.0 mm Number of attempts: 1 Airway Equipment and Method: Stylet Placement Confirmation: ETT inserted through vocal cords under direct vision, positive ETCO2 and breath sounds checked- equal and bilateral Secured at: 21 cm Tube secured with: Tape Dental Injury: Teeth and Oropharynx as per pre-operative assessment

## 2022-08-26 NOTE — Progress Notes (Signed)
Pharmacy Antibiotic Note  Linda [name] is a 76 y.o. female admitted on 08/26/2022 with recurrent urolithiasis.  Pharmacy has been consulted for pre-op Gentamicin dosing.  Note pt has PCN allergy, but has had Cephalosporins (ceftriaxone, cefotetan) on several occassions.    Plan: Gentamicin '300mg'$  (5 mg/kg using adjusted weight 60kg) IV x1 dose prior to 4:30 stone removal procedure.       Temp (24hrs), Avg:97.6 F (36.4 C), Min:97.5 F (36.4 C), Max:97.8 F (36.6 C)  Recent Labs  Lab 08/26/22 0137  WBC 9.0  CREATININE 0.73    CrCl cannot be calculated (Unknown ideal weight.).    Allergies  Allergen Reactions   Ciprofloxacin Itching   Codeine Nausea Only   Naproxen Hives and Itching   Nitrofurantoin Monohyd Macro Rash   Penicillins Rash and Other (See Comments)    Has patient had a PCN reaction causing immediate rash, facial/tongue/throat swelling, SOB or lightheadedness with hypotension: No Has patient had a PCN reaction causing severe rash involving mucus membranes or skin necrosis: Yes Has patient had a PCN reaction that required hospitalization No Has patient had a PCN reaction occurring within the last 10 years: No If all of the above answers are "NO", then may proceed with Cephalosporin use.      Thank you for allowing pharmacy to be a part of this patient's care.  Gretta Arab PharmD, BCPS Clinical Pharmacist [facility] main pharmacy [phone] 08/26/2022 9:45 AM

## 2022-08-26 NOTE — ED Triage Notes (Signed)
Pt reports hx of stones, this feels the same, L flank pain that started around 5pm, some nausea and hematuria

## 2022-08-26 NOTE — ED Notes (Signed)
Pt provided Chloro wipes and clean gown with instructions.

## 2022-08-26 NOTE — H&P (Signed)
Linda [name] is an 76 y.o. female.    Chief Complaint: Left Ureteral Stone with Refractory Colic  HPI:   1 - Recurrent Urolithiasis -  Pre 2023 - Ureteroscopy several, no shows follow up.  08/2022 - Left 72m prox ureteral stone with mild hydro, bilateral punctate renal only additional. Cr 0.7, WBC 9k, no fevers.   Today "Linda [name] is seen for above. She was treated for similar in 2020 and did not keep follow up. Refractory colic in ER unable to maintian PO hydration get off IV meds. Last meal 6p yesterday.   Past Medical History:  Diagnosis Date   Cholelithiasis 132/67/1245  Complication of anesthesia    was told by anesthesia in 1970's to watch her heart during surgery following a knee surgery; but ha shad no problems with surgeries afterwards   Diverticulitis    Kidney stone    Kidney stones    Rosacea     Past Surgical History:  Procedure Laterality Date   CESAREAN SECTION     COLON SURGERY     for diverticulitis   CYSTOSCOPY W/ URETERAL STENT PLACEMENT Left 01/23/2019   Procedure: CYSTOSCOPY WITH RETROGRADE PYELOGRAM/URETERAL STENT PLACEMENT,LASER uteroscopy;  Surgeon: MAlexis Frock MD;  Location: [facility];  Service: Urology;  Laterality: Left;   HEMORRHOID SURGERY     KNEE SURGERY Right    x3   lithotripsy   09/2016   PARTIAL HYSTERECTOMY     TOTAL SHOULDER REPLACEMENT Left 07/13/2010    Family History  Problem Relation Age of Onset   Nephrolithiasis Other    Heart disease Father    Colon cancer Neg Hx    Colon polyps Neg Hx    Esophageal cancer Neg Hx    Stomach cancer Neg Hx    Liver disease Neg Hx    Pancreatic cancer Neg Hx    Social History:  reports that she has quit smoking. She has never used smokeless tobacco. She reports that she does not drink alcohol and does not use drugs.  Allergies:  Allergies  Allergen Reactions   Ciprofloxacin Itching   Codeine Nausea Only   Naproxen Hives and Itching   Nitrofurantoin Monohyd Macro Rash    Penicillins Rash and Other (See Comments)    Has patient had a PCN reaction causing immediate rash, facial/tongue/throat swelling, SOB or lightheadedness with hypotension: No Has patient had a PCN reaction causing severe rash involving mucus membranes or skin necrosis: Yes Has patient had a PCN reaction that required hospitalization No Has patient had a PCN reaction occurring within the last 10 years: No If all of the above answers are "NO", then may proceed with Cephalosporin use.     (Not in a hospital admission)   Results for orders placed or performed during the hospital encounter of 08/26/22 (from the past 48 hour(s))  Urinalysis, Routine w reflex microscopic Urine, Clean Catch     Status: Abnormal   Collection Time: 08/26/22  1:19 AM  Result Value Ref Range   Color, Urine RED (A) YELLOW   APPearance CLOUDY (A) CLEAR   Specific Gravity, Urine  1.005 - 1.030    TEST NOT REPORTED DUE TO COLOR INTERFERENCE OF URINE PIGMENT   pH  5.0 - 8.0    TEST NOT REPORTED DUE TO COLOR INTERFERENCE OF URINE PIGMENT   Glucose, UA (A) NEGATIVE mg/dL    TEST NOT REPORTED DUE TO COLOR INTERFERENCE OF URINE PIGMENT   Hgb urine dipstick (A) NEGATIVE    TEST  NOT REPORTED DUE TO COLOR INTERFERENCE OF URINE PIGMENT   Bilirubin Urine (A) NEGATIVE    TEST NOT REPORTED DUE TO COLOR INTERFERENCE OF URINE PIGMENT   Ketones, ur (A) NEGATIVE mg/dL    TEST NOT REPORTED DUE TO COLOR INTERFERENCE OF URINE PIGMENT   Protein, ur (A) NEGATIVE mg/dL    TEST NOT REPORTED DUE TO COLOR INTERFERENCE OF URINE PIGMENT   Nitrite (A) NEGATIVE    TEST NOT REPORTED DUE TO COLOR INTERFERENCE OF URINE PIGMENT   Leukocytes,Ua (A) NEGATIVE    TEST NOT REPORTED DUE TO COLOR INTERFERENCE OF URINE PIGMENT   RBC / HPF >50 (H) 0 - 5 RBC/hpf   WBC, UA 6-10 0 - 5 WBC/hpf   Bacteria, UA RARE (A) NONE SEEN    Comment: Performed at [facility], [place] [address], [place], [place] [address]  CBC with Differential      Status: Abnormal   Collection Time: 08/26/22  1:37 AM  Result Value Ref Range   WBC 9.0 4.0 - 10.5 K/uL   RBC 4.85 3.87 - 5.11 MIL/uL   Hemoglobin 15.9 (H) 12.0 - 15.0 g/dL   HCT 45.8 36.0 - 46.0 %   MCV 94.4 80.0 - 100.0 fL   MCH 32.8 26.0 - 34.0 pg   MCHC 34.7 30.0 - 36.0 g/dL   RDW 13.2 11.5 - 15.5 %   Platelets 192 150 - 400 K/uL   nRBC 0.0 0.0 - 0.2 %   Neutrophils Relative % 65 %   Neutro Abs 5.9 1.7 - 7.7 K/uL   Lymphocytes Relative 26 %   Lymphs Abs 2.3 0.7 - 4.0 K/uL   Monocytes Relative 7 %   Monocytes Absolute 0.6 0.1 - 1.0 K/uL   Eosinophils Relative 2 %   Eosinophils Absolute 0.2 0.0 - 0.5 K/uL   Basophils Relative 0 %   Basophils Absolute 0.0 0.0 - 0.1 K/uL   Immature Granulocytes 0 %   Abs Immature Granulocytes 0.01 0.00 - 0.07 K/uL    Comment: Performed at [facility], [place] [address] [place],  [address]  Comprehensive metabolic panel     Status: Abnormal   Collection Time: 08/26/22  1:37 AM  Result Value Ref Range   Sodium 137 135 - 145 mmol/L   Potassium 4.2 3.5 - 5.1 mmol/L    Comment: HEMOLYSIS AT THIS LEVEL MAY AFFECT RESULT   Chloride 107 98 - 111 mmol/L   CO2 21 (L) 22 - 32 mmol/L   Glucose, Bld 118 (H) 70 - 99 mg/dL    Comment: Glucose reference range applies only to samples taken after fasting for at least 8 hours.   BUN 18 8 - 23 mg/dL   Creatinine, Ser 0.73 0.44 - 1.00 mg/dL   Calcium 9.9 8.9 - 10.3 mg/dL   Total Protein 7.1 6.5 - 8.1 g/dL   Albumin 4.3 3.5 - 5.0 g/dL   AST 46 (H) 15 - 41 U/L    Comment: HEMOLYSIS AT THIS LEVEL MAY AFFECT RESULT   ALT 50 (H) 0 - 44 U/L    Comment: HEMOLYSIS AT THIS LEVEL MAY AFFECT RESULT   Alkaline Phosphatase 74 38 - 126 U/L   Total Bilirubin 1.2 0.3 - 1.2 mg/dL    Comment: HEMOLYSIS AT THIS LEVEL MAY AFFECT RESULT   GFR, Estimated >60 >60 mL/min    Comment: (NOTE) Calculated using the CKD-EPI Creatinine Equation (2021)    Anion gap 9 5 - 15  Comment: Performed at [facility], [place] [address], [place], [place] [address]   CT Renal Stone Study  Result Date: 08/26/2022 CLINICAL DATA:  Left flank pain. EXAM: CT ABDOMEN AND PELVIS WITHOUT CONTRAST TECHNIQUE: Multidetector CT imaging of the abdomen and pelvis was performed following the standard protocol without IV contrast. RADIATION DOSE REDUCTION: This exam was performed according to the departmental dose-optimization program which includes automated exposure control, adjustment of the mA and/or kV according to patient size and/or use of iterative reconstruction technique. COMPARISON:  CT without contrast 01/22/2019 FINDINGS: Lower chest: Scattered linear scar-like opacities both lung bases. There is a small fat herniation in the medial basal left hemithorax through a small defect in the diaphragm chronically. The cardiac size is normal. There is no pericardial effusion. There is a small hiatal hernia. Hepatobiliary: There are a few small stones in the gallbladder but no wall thickening or biliary dilatation. The unenhanced liver is unremarkable. Pancreas: Unremarkable without contrast. Spleen: Unremarkable without contrast.  No splenomegaly. Adrenals/Urinary Tract: There is no adrenal mass. There is a 4 x 3 x 5 mm proximal left ureteral stone superimposing over the proximal left transverse process of L4. There is mild-to-moderate upstream hydronephrosis, renal edema and perirenal edema. Both ureters are otherwise clear. There are few scattered punctate nonobstructive caliceal stones in both kidneys. No focal abnormality is seen in the renal cortex on either side. There is no thickening of the bladder. Stomach/Bowel: Small hiatal hernia. No dilatation or wall thickening including the appendix. Rectosigmoid colocolic surgical anastomosis is again noted. There are scattered left colonic diverticula without evidence of acute diverticulitis. There is mild retained stool in the cecum and ascending colon, mild fecal  back up into the terminal ileum. Vascular/Lymphatic: Aortic atherosclerosis. No enlarged abdominal or pelvic lymph nodes. There is no AAA. Numerous pelvic phleboliths. Reproductive: Status post hysterectomy. No adnexal masses. Other: There are small umbilical and bilateral inguinal fat hernias. There is no incarcerated hernia. There is no free air, free hemorrhage or free fluid. Musculoskeletal: There is levoscoliosis and facet hypertrophy lumbar spine, greatest facet hypertrophy at L4-5 and L5-S1 where there is chronic grade 1 anterolisthesis at both levels, with acquired spinal canal stenosis. The discs are degenerated at the lowest 2 levels as well. There is a small bone island is again noted in the posterior right ilium. No concerning regional bone lesions. IMPRESSION: 1. 4 x 3 x 5 mm proximal left ureteral stone, L4 transverse process level with mild-to-moderate obstructive uropathy. Please correlate clinically for infectious complication. 2. Nonobstructive bilateral micronephrolithiasis. 3. Umbilical and inguinal fat hernias and a small hiatal hernia. 4. Cholelithiasis without evidence of cholecystitis. 5. Fecal retention ascending colon with terminal ileal fecal back up. No bowel obstruction or inflammation. Electronically Signed   By: Telford Nab M.D.   On: 08/26/2022 02:30    Review of Systems  Constitutional:  Negative for chills and fever.  Gastrointestinal:  Positive for nausea.  Genitourinary:  Positive for flank pain.  All other systems reviewed and are negative.   Blood pressure (!) 146/84, pulse 79, temperature (!) 97.5 F (36.4 C), temperature source Oral, resp. rate 18, SpO2 90 %. Physical Exam Vitals reviewed.  Constitutional:      Comments: NAD in ER. Sister at bedside.   HENT:     Head: Normocephalic.  Eyes:     Pupils: Pupils are equal, round, and reactive to light.  Cardiovascular:     Rate and Rhythm: Normal rate.  Abdominal:  Comments: Moderate truncal obesitty   Genitourinary:    Comments: Mild left CVAT at present.  Musculoskeletal:        General: Normal range of motion.     Cervical back: Normal range of motion.  Neurological:     General: No focal deficit present.     Mental Status: She is alert.  Psychiatric:        Mood and Affect: Mood normal.      Assessment/Plan  Discussed management options of medical therapy (approx 50% eventual passage), SWL, URS in order of increasing effectiveness but also invasiveness. As her colic is refractory, she opts for left ureteroscopy today with goal of left side stone free. Risks,  benefits, peri-op course discussed.   Likely DC home from PACU as long as colic resolved post-op.   Alexis Frock, MD 08/26/2022, 9:19 AM

## 2022-08-26 NOTE — Discharge Instructions (Signed)
1 - You may have urinary urgency (bladder spasms) and bloody urine on / off with stent in place. This is normal.  2 - Remove tethered stent on Friday morning at home by pulling string, then blue-white plastic tubing, and discarding. Office is open Friday if any issues arise.   3 - Call MD or go to ER for fever >102, severe pain / nausea / vomiting not relieved by medications, or acute change in medical status

## 2022-08-26 NOTE — Anesthesia Preprocedure Evaluation (Addendum)
Anesthesia Evaluation  Patient identified by MRN, date of birth, ID band Patient awake    Reviewed: Allergy & Precautions, NPO status , Patient's Chart, lab work & pertinent test results  History of Anesthesia Complications Negative for: history of anesthetic complications  Airway Mallampati: II  TM Distance: >3 FB Neck ROM: Full    Dental no notable dental hx.    Pulmonary former smoker,    Pulmonary exam normal        Cardiovascular negative cardio ROS Normal cardiovascular exam     Neuro/Psych negative neurological ROS  negative psych ROS   GI/Hepatic negative GI ROS, Neg liver ROS,   Endo/Other  negative endocrine ROS  Renal/GU Kidney stones  negative genitourinary   Musculoskeletal negative musculoskeletal ROS (+)   Abdominal   Peds  Hematology negative hematology ROS (+)   Anesthesia Other Findings Day of surgery medications reviewed with patient.  Reproductive/Obstetrics negative OB ROS                           Anesthesia Physical Anesthesia Plan  ASA: 2  Anesthesia Plan: General   Post-op Pain Management: Tylenol PO (pre-op)*   Induction: Intravenous  PONV Risk Score and Plan: 3 and Treatment may vary due to age or medical condition, Ondansetron and Dexamethasone  Airway Management Planned: LMA  Additional Equipment: None  Intra-op Plan:   Post-operative Plan: Extubation in OR  Informed Consent: I have reviewed the patients History and Physical, chart, labs and discussed the procedure including the risks, benefits and alternatives for the proposed anesthesia with the patient or authorized representative who has indicated his/her understanding and acceptance.     Dental advisory given  Plan Discussed with: CRNA  Anesthesia Plan Comments:        Anesthesia Quick Evaluation

## 2022-08-26 NOTE — Anesthesia Postprocedure Evaluation (Signed)
Anesthesia Post Note  Patient: Linda [name]  Procedure(s) Performed: CYSTOSCOPY/URETEROSCOPY/HOLMIUM LASER/STENT PLACEMENT (Left: Urethra)     Patient location during evaluation: PACU Anesthesia Type: General Level of consciousness: awake and alert Pain management: pain level controlled Vital Signs Assessment: post-procedure vital signs reviewed and stable Respiratory status: spontaneous breathing, nonlabored ventilation and respiratory function stable Cardiovascular status: blood pressure returned to baseline Postop Assessment: no apparent nausea or vomiting Anesthetic complications: no   No notable events documented.  Last Vitals:  Vitals:   08/26/22 1715 08/26/22 1730  BP: (!) 161/82 (!) 170/93  Pulse: 81 78  Resp: 14 16  Temp:    SpO2: 93% 97%    Last Pain:  Vitals:   08/26/22 1730  TempSrc:   PainSc: 0-No pain                 Marthenia Rolling

## 2022-08-26 NOTE — ED Provider Notes (Signed)
[facility] DEPT Provider Note   CSN: [id number] Arrival date & time: 08/26/22  0102     History  Chief Complaint  Patient presents with   Flank Pain    Linda [name] is a 76 y.o. female who presents with concern for left-sided flank pain that started this evening has a dull achy pain and then suddenly began to be very sharp around midnight, radiating down to the left lower belly.  History of multiple kidney stones in the past requiring lithotripsy and stenting with [facility]. Patient underwent cystoscopy with stent placement in March 2020 for left ureteral stone with retrocolic, by Dr. Tresa Moore. Denies fevers or chills.  Endorses hematuria but denies burning with urination.  I personally reviewed her records.  She has history of diverticulosis status post sigmoid colectomy, multiple kidney stones.  No medications daily.    HPI     Home Medications Prior to Admission medications   Medication Sig Start Date End Date Taking? Authorizing Provider  Multiple Vitamins-Minerals (CENTRUM ADULTS PO) Take 1 tablet by mouth daily.    [provider]      Allergies    Ciprofloxacin, Codeine, Naproxen, Nitrofurantoin monohyd macro, and Penicillins    Review of Systems   Review of Systems  Gastrointestinal:  Positive for abdominal pain and nausea.  Genitourinary:  Positive for flank pain, frequency and hematuria. Negative for dysuria and urgency.    Physical Exam Updated Vital Signs BP (!) 206/109 (BP Location: Right Arm)   Pulse 88   Temp 97.8 F (36.6 C) (Oral)   Resp 20   SpO2 98%  Physical Exam Vitals and nursing note reviewed.  Constitutional:      Appearance: She is not ill-appearing or toxic-appearing.  HENT:     Head: Normocephalic and atraumatic.     Mouth/Throat:     Mouth: Mucous membranes are moist.     Pharynx: No oropharyngeal exudate or posterior oropharyngeal erythema.  Eyes:     General:        Right eye: No  discharge.        Left eye: No discharge.     Conjunctiva/sclera: Conjunctivae normal.  Cardiovascular:     Rate and Rhythm: Normal rate and regular rhythm.     Pulses: Normal pulses.  Pulmonary:     Effort: Pulmonary effort is normal. No respiratory distress.     Breath sounds: Normal breath sounds. No wheezing or rales.  Abdominal:     General: Bowel sounds are normal. There is no distension.     Palpations: Abdomen is soft.     Tenderness: There is abdominal tenderness in the left upper quadrant and left lower quadrant. There is no right CVA tenderness, left CVA tenderness, guarding or rebound.  Musculoskeletal:        General: No deformity.     Cervical back: Neck supple.     Right lower leg: No edema.     Left lower leg: No edema.  Skin:    General: Skin is warm and dry.     Capillary Refill: Capillary refill takes less than 2 seconds.  Neurological:     General: No focal deficit present.     Mental Status: She is alert and oriented to person, place, and time. Mental status is at baseline.  Psychiatric:        Mood and Affect: Mood normal.     ED Results / Procedures / Treatments   Labs (all labs ordered are listed,  but only abnormal results are displayed) Labs Reviewed  URINALYSIS, ROUTINE W REFLEX MICROSCOPIC - Abnormal; Notable for the following components:      Result Value   Color, Urine RED (*)    APPearance CLOUDY (*)    Glucose, UA   (*)    Value: TEST NOT REPORTED DUE TO COLOR INTERFERENCE OF URINE PIGMENT   Hgb urine dipstick   (*)    Value: TEST NOT REPORTED DUE TO COLOR INTERFERENCE OF URINE PIGMENT   Bilirubin Urine   (*)    Value: TEST NOT REPORTED DUE TO COLOR INTERFERENCE OF URINE PIGMENT   Ketones, ur   (*)    Value: TEST NOT REPORTED DUE TO COLOR INTERFERENCE OF URINE PIGMENT   Protein, ur   (*)    Value: TEST NOT REPORTED DUE TO COLOR INTERFERENCE OF URINE PIGMENT   Nitrite   (*)    Value: TEST NOT REPORTED DUE TO COLOR INTERFERENCE OF URINE  PIGMENT   Leukocytes,Ua   (*)    Value: TEST NOT REPORTED DUE TO COLOR INTERFERENCE OF URINE PIGMENT   RBC / HPF >50 (*)    Bacteria, UA RARE (*)    All other components within normal limits  CBC WITH DIFFERENTIAL/PLATELET - Abnormal; Notable for the following components:   Hemoglobin 15.9 (*)    All other components within normal limits  COMPREHENSIVE METABOLIC PANEL - Abnormal; Notable for the following components:   CO2 21 (*)    Glucose, Bld 118 (*)    AST 46 (*)    ALT 50 (*)    All other components within normal limits    EKG None  Radiology CT Renal Stone Study  Result Date: 08/26/2022 CLINICAL DATA:  Left flank pain. EXAM: CT ABDOMEN AND PELVIS WITHOUT CONTRAST TECHNIQUE: Multidetector CT imaging of the abdomen and pelvis was performed following the standard protocol without IV contrast. RADIATION DOSE REDUCTION: This exam was performed according to the departmental dose-optimization program which includes automated exposure control, adjustment of the mA and/or kV according to patient size and/or use of iterative reconstruction technique. COMPARISON:  CT without contrast 01/22/2019 FINDINGS: Lower chest: Scattered linear scar-like opacities both lung bases. There is a small fat herniation in the medial basal left hemithorax through a small defect in the diaphragm chronically. The cardiac size is normal. There is no pericardial effusion. There is a small hiatal hernia. Hepatobiliary: There are a few small stones in the gallbladder but no wall thickening or biliary dilatation. The unenhanced liver is unremarkable. Pancreas: Unremarkable without contrast. Spleen: Unremarkable without contrast.  No splenomegaly. Adrenals/Urinary Tract: There is no adrenal mass. There is a 4 x 3 x 5 mm proximal left ureteral stone superimposing over the proximal left transverse process of L4. There is mild-to-moderate upstream hydronephrosis, renal edema and perirenal edema. Both ureters are otherwise  clear. There are few scattered punctate nonobstructive caliceal stones in both kidneys. No focal abnormality is seen in the renal cortex on either side. There is no thickening of the bladder. Stomach/Bowel: Small hiatal hernia. No dilatation or wall thickening including the appendix. Rectosigmoid colocolic surgical anastomosis is again noted. There are scattered left colonic diverticula without evidence of acute diverticulitis. There is mild retained stool in the cecum and ascending colon, mild fecal back up into the terminal ileum. Vascular/Lymphatic: Aortic atherosclerosis. No enlarged abdominal or pelvic lymph nodes. There is no AAA. Numerous pelvic phleboliths. Reproductive: Status post hysterectomy. No adnexal masses. Other: There are small umbilical and bilateral  inguinal fat hernias. There is no incarcerated hernia. There is no free air, free hemorrhage or free fluid. Musculoskeletal: There is levoscoliosis and facet hypertrophy lumbar spine, greatest facet hypertrophy at L4-5 and L5-S1 where there is chronic grade 1 anterolisthesis at both levels, with acquired spinal canal stenosis. The discs are degenerated at the lowest 2 levels as well. There is a small bone island is again noted in the posterior right ilium. No concerning regional bone lesions. IMPRESSION: 1. 4 x 3 x 5 mm proximal left ureteral stone, L4 transverse process level with mild-to-moderate obstructive uropathy. Please correlate clinically for infectious complication. 2. Nonobstructive bilateral micronephrolithiasis. 3. Umbilical and inguinal fat hernias and a small hiatal hernia. 4. Cholelithiasis without evidence of cholecystitis. 5. Fecal retention ascending colon with terminal ileal fecal back up. No bowel obstruction or inflammation. Electronically Signed   By: Telford Nab M.D.   On: 08/26/2022 02:30    Procedures Procedures    Medications Ordered in ED Medications  HYDROmorphone (DILAUDID) injection 1 mg (has no administration  in time range)  lactated ringers bolus 1,000 mL (has no administration in time range)  ondansetron (ZOFRAN-ODT) disintegrating tablet 4 mg (4 mg Oral Given 08/26/22 0125)  oxyCODONE-acetaminophen (PERCOCET/ROXICET) 5-325 MG per tablet 1 tablet (1 tablet Oral Given 08/26/22 0125)    ED Course/ Medical Decision Making/ A&P Clinical Course as of 08/26/22 0654  Tue Aug 26, 2022  0627 Pain resolved, but vomiting. Zofran ordered.  [RS]  [phone] Consult to urologist Dr. Gilford Rile who states that if patient's symptoms are well controlled in the ED and she has a normal postvoid residual she may be discharged home to call their office today for close outpatient follow-up.  At this time no indication for antibiotics given patient is afebrile, does not a white count, and does not have any urinary symptoms aside from flank pain.  I appreciate his collaboration in care of this patient. [RS]    Clinical Course User Index [RS] Aura Dials                           Medical Decision Making 76 year old female with history of kidney stones who presents with concern for flank pain that started today as well as hematuria.  Exquisitely hypertensive on intake, vitals otherwise normal.  Cardiopulmonary exam is normal, dental exam as above with left-sided TTP without CVAT.  The differential diagnosis of emergent flank pain includes, but is not limited to: Nephrolithiasis/ Renal Colic, Pyelonephritis, Abdominal aortic aneurysm, Aortic dissection, Renal artery embolism, Renal vein thrombosis, Renal infarction, Renal hemorrhage, Mesenteric ischemia, Bladder tumor, Cystitis, Biliary colic, Pancreatitis, Perforated peptic ulcer,  Appendicitis, Inguinal Hernia, Diverticulitis, Bowel obstruction. Shingles, Lower lobe pneumonia, Retroperitoneal hematoma/abscess/tumor, Epidural abscess, Epidural hematoma.  Particularly in females it is important to consider (Ectopic Pregnancy,PID/TOA,Ovarian cyst, Ovarian torsion,  STD)       We will provide IV fluids, pain medication, and reevaluate.  We will also attempt to recollect UA that may be clear sample in order to determine likelihood of infectious contribution to patient's presentation.   Amount and/or Complexity of Data Reviewed Labs: ordered.    Details: CBC without leukocytosis or anemia.  CMP with mild transaminitis with AST/ALT 46/50, otherwise unremarkable.  Normal renal function and electrolytes.  UA unable to be tested secondary to significant hematuria and red color. Radiology: ordered.    Details: CT renal study with 5 x 3 x 4 millimeter left proximal ureteral stone at  the L4 level with mild to moderate obstructive uropathy.  Some fecal retention as well without obstruction.  It is visualized with this provider.  Risk Prescription drug management.   Patient with a moderately obstructive left-sided ureterolithiasis, no evidence of infectious contribution at this time, still unable to get UA.  Consult to urology as above.  At time of shift change care of this patient signed out to oncoming ED provider Fatima Sanger, PA-C.  All pertinent HPI, physical exam, laboratory findings were discussed with her prior to my departure.  At time of shift change, patient is pending reevaluation after redosing of pain medication and antiemetic.  Patient will need to offer repeat urine sample for potential repeat UA.  Additionally per urology need postvoid residual before patient can be discharged home.  If symptoms are well controlled and patient remains hemodynamically stable and there is no evidence of infection, patient may be discharged to close follow-up with urology.  Low threshold for admission if patient symptoms are difficult to manage. Linda [name] and her sister  voiced understanding of her medical evaluation and treatment plan. Each of their questions answered to their expressed satisfaction.  Return precautions were given.  Patient is well-appearing, stable, and  was discharged in good condition.  This chart was dictated using voice recognition software, Dragon. Despite the best efforts of this provider to proofread and correct errors, errors may still occur which can change documentation meaning.    Final Clinical Impression(s) / ED Diagnoses Final diagnoses:  None    Rx / DC Orders ED Discharge Orders     None         Aura Dials 08/26/22 0656    Quintella Reichert, MD 08/26/22 [phone]

## 2022-08-26 NOTE — ED Provider Notes (Signed)
Received report from [facility], pending nausea/vomiting resolution.   Physical Exam  BP (!) 142/79   Pulse 86   Temp (!) 97.5 F (36.4 C)   Resp 18   SpO2 94%   Physical Exam Constitutional:      Appearance: She is ill-appearing.  HENT:     Mouth/Throat:     Comments: +actively wretching Eyes:     Conjunctiva/sclera: Conjunctivae normal.  Cardiovascular:     Rate and Rhythm: Normal rate and regular rhythm.  Pulmonary:     Effort: Pulmonary effort is normal.     Breath sounds: Normal breath sounds.     Comments: +4L O2 Abdominal:     General: Abdomen is flat.     Palpations: Abdomen is soft.     Tenderness: There is generalized abdominal tenderness.  Musculoskeletal:        General: Normal range of motion.  Skin:    General: Skin is warm and dry.     Capillary Refill: Capillary refill takes less than 2 seconds.  Neurological:     General: No focal deficit present.     Procedures  Procedures  ED Course / MDM   Clinical Course as of 08/26/22 0826  Tue Aug 26, 2022  0627 Pain resolved, but vomiting. Zofran ordered.  [RS]  [phone] Consult to urologist Dr. Gilford Rile who states that if patient's symptoms are well controlled in the ED and she has a normal postvoid residual she may be discharged home to call their office today for close outpatient follow-up.  At this time no indication for antibiotics given patient is afebrile, does not a white count, and does not have any urinary symptoms aside from flank pain.  I appreciate his collaboration in care of this patient. [RS]    Clinical Course User Index [RS] Sponseller, Gypsy Balsam, PA-C   Medical Decision Making Amount and/or Complexity of Data Reviewed Labs: ordered. Radiology: ordered.  Risk Prescription drug management. Decision regarding hospitalization.   Patient continues to actively retch, unable to tolerate p.o. intake, unable to stop vomiting.  Has had multiple doses of pain medication and is now requiring 4 L  of O2 to help compensate.  I spoke with Dr. Tammi Klippel, he states he will be able to evaluate 3 PM today, and likely stent given uncontrolled pain/nausea vomiting.  Spoke with hospitalist, he declines admission 2/2 to lack of co-morbidities, states N/V.  Seen by urology, they will stent, discharged from PACU.     Osvaldo Shipper, [place] 08/26/22 [place], MD 08/27/22 [phone]

## 2022-08-26 NOTE — Transfer of Care (Signed)
Immediate Anesthesia Transfer of Care Note  Patient: Linda [name]  Procedure(s) Performed: CYSTOSCOPY/URETEROSCOPY/HOLMIUM LASER/STENT PLACEMENT (Left: Urethra)  Patient Location: PACU  Anesthesia Type:General  Level of Consciousness: awake, alert , oriented and patient cooperative  Airway & Oxygen Therapy: Patient Spontanous Breathing and Patient connected to face mask oxygen  Post-op Assessment: Report given to RN, Post -op Vital signs reviewed and stable and Patient moving all extremities  Post vital signs: Reviewed and stable  Last Vitals:  Vitals Value Taken Time  BP 173/100 08/26/22 1658  Temp    Pulse 86 08/26/22 1659  Resp 17 08/26/22 1659  SpO2 99 % 08/26/22 1659  Vitals shown include unvalidated device data.  Last Pain:  Vitals:   08/26/22 1500  TempSrc:   PainSc: 1       Patients Stated Pain Goal: 4 (29/56/21 3086)  Complications: No notable events documented.

## 2022-08-26 NOTE — Brief Op Note (Signed)
08/26/2022  4:48 PM  PATIENT:  Linda [name]  76 y.o. female  PRE-OPERATIVE DIAGNOSIS:  LEFT URETERAL STONE  POST-OPERATIVE DIAGNOSIS:  LEFT URETERAL STONE  PROCEDURE:  Procedure(s): CYSTOSCOPY/URETEROSCOPY/HOLMIUM LASER/STENT PLACEMENT (Left)  SURGEON:  Surgeon(s) and Role:    * Alexis Frock, MD - Primary  PHYSICIAN ASSISTANT:   ASSISTANTS: none   ANESTHESIA:   general  EBL:  minimal   BLOOD ADMINISTERED:none  DRAINS: none   LOCAL MEDICATIONS USED:  NONE  SPECIMEN:  Source of Specimen:  left ureteral stone  DISPOSITION OF SPECIMEN:   [facility] for compositional analysis  COUNTS:  YES  TOURNIQUET:  * No tourniquets in log *  DICTATION: .Other Dictation: Dictation Number [id number]  PLAN OF CARE: Discharge to home after PACU  PATIENT DISPOSITION:  PACU - hemodynamically stable.   Delay start of Pharmacological VTE agent (>24hrs) due to surgical blood loss or risk of bleeding: not applicable

## 2022-08-27 ENCOUNTER — Encounter (HOSPITAL_COMMUNITY): Payer: Self-pay | Admitting: Urology

## 2022-08-27 NOTE — Op Note (Signed)
[name], [name] MEDICAL RECORD NO: [id number] ACCOUNT NO: [id number] DATE OF BIRTH: 11-06-1946 FACILITY: [facility] LOCATION: [facility] PHYSICIAN: Alexis Frock, MD  Operative Report   DATE OF PROCEDURE: 08/26/2022  PREOPERATIVE DIAGNOSES:  Left ureteral stone, refractory colic.  PROCEDURE PERFORMED: 1.  Cystoscopy with left retrograde pyelogram and interpretation. 2.  Left ureteroscopy with laser lithotripsy. 3.  Insertion of left ureteral stent.  ESTIMATED BLOOD LOSS:  Nil.  COMPLICATIONS:  None.  SPECIMEN:  Left ureteral stone for composition analysis.  FINDINGS:  1.  Mild hydronephrosis due to left proximal ureteral stone. 2.  Complete resolution of all accessible stone fragments larger than one-third mm following laser lithotripsy and basket extraction on the left side. 3.  Successful placement of left ureteral stent, proximal end in the renal pelvis, distal end in urinary bladder, with tether.  INDICATIONS:  This is a pleasant 76 year old lady with history of recurrent urolithiasis but variable medical compliance.  She was found on workup of colicky flank pain to have a left proximal ureteral stone by CT imaging in the emergency room last  night.  She was given an initial trial of medical therapy in the emergency room; however, her colic remained refractory and very difficult to control.  This has been her situation previously with ureteral stones.  She was evaluated and options were  discussed including continued medical therapy versus definitive management with ureteroscopy and she wished to proceed with the latter in the acute setting.  Informed consent was obtained and placed in medical record.  PROCEDURE IN DETAIL:  The patient being verified, procedure being left ureteroscopic stone manipulation was confirmed.  Procedure timeout was performed.  Intravenous antibiotics were administered.  General endotracheal anesthesia induced.  The patient  was placed into a low  lithotomy position.  Sterile field was created, prepping and draping the patient's vagina, introitus, and proximal thighs using iodine.  Cystourethroscopy was performed using 21-French rigid cystoscope with offset lens.  Inspection  of urinary bladder revealed no diverticula, calcifications, papillary lesions.  Ureteral orifices appeared single.  The left ureteral orifice was cannulated with a 6-French end-hole catheter, and a left retrograde pyelogram was obtained.  Left retrograde pyelogram demonstrated single left ureter, single system left kidney.  There was a filling defect in the proximal ureter consistent with known stone.  There was mild hydronephrosis above this, 0.038 ZIPwire was advanced to the level of  upper pole, set aside as a safety wire.  An 8-French feeding tube placed in the urinary bladder for pressure release and semirigid ureteroscopy performed of the distal four-fifths of left ureter alongside a separate sensor working wire. As anticipated,  in the proximal ureter, there was a stone that was moderately impacted.  This did appear too large for simple basketing.  As such, holmium laser energy applied to stone using settings of 0.2 joules and 20 Hz.  Approximately 30% of the stone was dusted  with the remaining two-thirds being fragmented into 2 dominant fragments that were then amenable to simple basketing and removed, set aside for composition analysis.  There is some question of ipsilateral papillary tip calcifications on her preoperative  imaging and the goal today was to render left side stone free.  Therefore, the semirigid scope was exchanged for the short length ureteral access sheath to the level of proximal ureter using continuous fluoroscopic guidance and flexible digital  ureteroscopy was performed proximal left ureter and systematic inspection of left kidney including all calices x2.  There was no evidence of  intraluminal stones whatsoever. It was felt that any visualized  calcifications on CT likely were submucosal.   We achieved the goals of the surgery today.  Access sheath was removed under continuous vision.  There was some mild mucosal edema at the area of the site of prior stone impaction. It was felt the most prudent means of management would be interval  stenting with a tethered stent and a new 5 x 22 Polaris type stent was placed over the remaining safety wire using fluoroscopic guidance.  Good proximal and distal planes were noted.  Tether was left in place and trimmed to length and tucked per vagina  and the procedure was terminated.  The patient tolerated procedure well, no immediate perioperative complications.  The patient was taken to postanesthesia care in stable condition.  Plan for discharge home.   SHW D: 08/26/2022 4:53:27 pm T: 08/27/2022 2:15:00 am  JOB: [id number]/ [id number]

## 2022-09-08 DIAGNOSIS — N202 Calculus of kidney with calculus of ureter: Secondary | ICD-10-CM | POA: Diagnosis not present

## 2022-09-17 DIAGNOSIS — L570 Actinic keratosis: Secondary | ICD-10-CM | POA: Diagnosis not present

## 2022-09-17 DIAGNOSIS — L821 Other seborrheic keratosis: Secondary | ICD-10-CM | POA: Diagnosis not present

## 2022-09-17 DIAGNOSIS — L853 Xerosis cutis: Secondary | ICD-10-CM | POA: Diagnosis not present

## 2022-09-17 DIAGNOSIS — L298 Other pruritus: Secondary | ICD-10-CM | POA: Diagnosis not present

## 2022-09-17 DIAGNOSIS — L820 Inflamed seborrheic keratosis: Secondary | ICD-10-CM | POA: Diagnosis not present

## 2022-09-17 DIAGNOSIS — D225 Melanocytic nevi of trunk: Secondary | ICD-10-CM | POA: Diagnosis not present

## 2022-09-17 DIAGNOSIS — L814 Other melanin hyperpigmentation: Secondary | ICD-10-CM | POA: Diagnosis not present

## 2022-09-17 DIAGNOSIS — L538 Other specified erythematous conditions: Secondary | ICD-10-CM | POA: Diagnosis not present

## 2023-02-09 DIAGNOSIS — Z1231 Encounter for screening mammogram for malignant neoplasm of breast: Secondary | ICD-10-CM | POA: Diagnosis not present

## 2023-07-22 DIAGNOSIS — H26491 Other secondary cataract, right eye: Secondary | ICD-10-CM | POA: Diagnosis not present

## 2023-07-22 DIAGNOSIS — H53143 Visual discomfort, bilateral: Secondary | ICD-10-CM | POA: Diagnosis not present

## 2023-07-22 DIAGNOSIS — H5211 Myopia, right eye: Secondary | ICD-10-CM | POA: Diagnosis not present

## 2023-07-22 DIAGNOSIS — H524 Presbyopia: Secondary | ICD-10-CM | POA: Diagnosis not present

## 2023-08-06 DIAGNOSIS — T63441A Toxic effect of venom of bees, accidental (unintentional), initial encounter: Secondary | ICD-10-CM | POA: Diagnosis not present

## 2023-08-06 DIAGNOSIS — K5792 Diverticulitis of intestine, part unspecified, without perforation or abscess without bleeding: Secondary | ICD-10-CM | POA: Diagnosis not present

## 2023-09-22 DIAGNOSIS — L538 Other specified erythematous conditions: Secondary | ICD-10-CM | POA: Diagnosis not present

## 2023-09-22 DIAGNOSIS — L814 Other melanin hyperpigmentation: Secondary | ICD-10-CM | POA: Diagnosis not present

## 2023-09-22 DIAGNOSIS — L218 Other seborrheic dermatitis: Secondary | ICD-10-CM | POA: Diagnosis not present

## 2023-09-22 DIAGNOSIS — D485 Neoplasm of uncertain behavior of skin: Secondary | ICD-10-CM | POA: Diagnosis not present

## 2023-09-22 DIAGNOSIS — D225 Melanocytic nevi of trunk: Secondary | ICD-10-CM | POA: Diagnosis not present

## 2023-09-22 DIAGNOSIS — L570 Actinic keratosis: Secondary | ICD-10-CM | POA: Diagnosis not present

## 2023-09-22 DIAGNOSIS — L821 Other seborrheic keratosis: Secondary | ICD-10-CM | POA: Diagnosis not present

## 2023-09-22 DIAGNOSIS — L720 Epidermal cyst: Secondary | ICD-10-CM | POA: Diagnosis not present

## 2023-09-22 DIAGNOSIS — L820 Inflamed seborrheic keratosis: Secondary | ICD-10-CM | POA: Diagnosis not present

## 2023-11-18 DIAGNOSIS — L570 Actinic keratosis: Secondary | ICD-10-CM | POA: Diagnosis not present

## 2023-11-18 DIAGNOSIS — L821 Other seborrheic keratosis: Secondary | ICD-10-CM | POA: Diagnosis not present

## 2024-02-15 DIAGNOSIS — Z1231 Encounter for screening mammogram for malignant neoplasm of breast: Secondary | ICD-10-CM | POA: Diagnosis not present

## 2024-02-19 DIAGNOSIS — R922 Inconclusive mammogram: Secondary | ICD-10-CM | POA: Diagnosis not present

## 2024-02-22 ENCOUNTER — Other Ambulatory Visit: Payer: Self-pay | Admitting: Radiology

## 2024-02-22 DIAGNOSIS — N6312 Unspecified lump in the right breast, upper inner quadrant: Secondary | ICD-10-CM | POA: Diagnosis not present

## 2024-02-22 DIAGNOSIS — N6021 Fibroadenosis of right breast: Secondary | ICD-10-CM | POA: Diagnosis not present

## 2024-02-23 LAB — SURGICAL PATHOLOGY

## 2024-07-27 DIAGNOSIS — H53143 Visual discomfort, bilateral: Secondary | ICD-10-CM | POA: Diagnosis not present

## 2024-07-27 DIAGNOSIS — Z961 Presence of intraocular lens: Secondary | ICD-10-CM | POA: Diagnosis not present

## 2024-07-27 DIAGNOSIS — H524 Presbyopia: Secondary | ICD-10-CM | POA: Diagnosis not present
